# Patient Record
Sex: Male | Born: 1965 | Race: White | Hispanic: No | Marital: Married | Smoking: Never smoker
Health system: Southern US, Community
[De-identification: ages and names within clinical notes are randomized; demographics above are authoritative.]

## PROBLEM LIST (undated history)

## (undated) DIAGNOSIS — D352 Benign neoplasm of pituitary gland: Secondary | ICD-10-CM

## (undated) DIAGNOSIS — G4730 Sleep apnea, unspecified: Secondary | ICD-10-CM

## (undated) DIAGNOSIS — I10 Essential (primary) hypertension: Secondary | ICD-10-CM

## (undated) HISTORY — PX: TONSILLECTOMY: SUR1361

---

## 2016-04-18 DIAGNOSIS — I615 Nontraumatic intracerebral hemorrhage, intraventricular: Secondary | ICD-10-CM

## 2016-04-18 HISTORY — DX: Nontraumatic intracerebral hemorrhage, intraventricular: I61.5

## 2016-06-27 ENCOUNTER — Inpatient Hospital Stay (HOSPITAL_COMMUNITY)
Admission: EM | Admit: 2016-06-27 | Discharge: 2016-07-04 | DRG: 065 | Disposition: A | Discharge status: Home / Self Care | Payer: PRIVATE HEALTH INSURANCE | Attending: Neurology | Admitting: Neurology

## 2016-06-27 ENCOUNTER — Encounter (HOSPITAL_COMMUNITY): Payer: Self-pay | Admitting: Emergency Medicine

## 2016-06-27 DIAGNOSIS — I615 Nontraumatic intracerebral hemorrhage, intraventricular: Secondary | ICD-10-CM | POA: Diagnosis not present

## 2016-06-27 DIAGNOSIS — I161 Hypertensive emergency: Secondary | ICD-10-CM | POA: Diagnosis present

## 2016-06-27 DIAGNOSIS — D72829 Elevated white blood cell count, unspecified: Secondary | ICD-10-CM | POA: Diagnosis present

## 2016-06-27 DIAGNOSIS — Z885 Allergy status to narcotic agent status: Secondary | ICD-10-CM

## 2016-06-27 DIAGNOSIS — Z6841 Body Mass Index (BMI) 40.0 and over, adult: Secondary | ICD-10-CM

## 2016-06-27 DIAGNOSIS — Z91013 Allergy to seafood: Secondary | ICD-10-CM

## 2016-06-27 DIAGNOSIS — R270 Ataxia, unspecified: Secondary | ICD-10-CM | POA: Diagnosis present

## 2016-06-27 DIAGNOSIS — I10 Essential (primary) hypertension: Secondary | ICD-10-CM | POA: Diagnosis present

## 2016-06-27 DIAGNOSIS — E785 Hyperlipidemia, unspecified: Secondary | ICD-10-CM | POA: Diagnosis present

## 2016-06-27 DIAGNOSIS — I619 Nontraumatic intracerebral hemorrhage, unspecified: Secondary | ICD-10-CM

## 2016-06-27 DIAGNOSIS — Z79899 Other long term (current) drug therapy: Secondary | ICD-10-CM

## 2016-06-27 DIAGNOSIS — D497 Neoplasm of unspecified behavior of endocrine glands and other parts of nervous system: Secondary | ICD-10-CM | POA: Diagnosis present

## 2016-06-27 DIAGNOSIS — R51 Headache: Secondary | ICD-10-CM | POA: Diagnosis not present

## 2016-06-27 DIAGNOSIS — E876 Hypokalemia: Secondary | ICD-10-CM | POA: Diagnosis not present

## 2016-06-27 HISTORY — DX: Essential (primary) hypertension: I10

## 2016-06-27 HISTORY — DX: Sleep apnea, unspecified: G47.30

## 2016-06-27 LAB — CBG MONITORING, ED: GLUCOSE-CAPILLARY: 113 mg/dL — AB (ref 65–99)

## 2016-06-27 NOTE — ED Triage Notes (Signed)
Per EMS pt from home pt had dizzy spell that subsided, 9:30 pm N/V, pt did take 25 mg oral phenergan pta, transported on 3/8 and diagnosed with benign brain tumor, pt was instructed to call EMS if pt had dizziness, N or V

## 2016-06-28 ENCOUNTER — Emergency Department (HOSPITAL_COMMUNITY): Payer: PRIVATE HEALTH INSURANCE

## 2016-06-28 ENCOUNTER — Inpatient Hospital Stay (HOSPITAL_COMMUNITY): Payer: PRIVATE HEALTH INSURANCE

## 2016-06-28 ENCOUNTER — Encounter (HOSPITAL_COMMUNITY): Payer: Self-pay | Admitting: Radiology

## 2016-06-28 DIAGNOSIS — I161 Hypertensive emergency: Secondary | ICD-10-CM | POA: Diagnosis not present

## 2016-06-28 DIAGNOSIS — Z885 Allergy status to narcotic agent status: Secondary | ICD-10-CM | POA: Diagnosis not present

## 2016-06-28 DIAGNOSIS — I10 Essential (primary) hypertension: Secondary | ICD-10-CM | POA: Diagnosis present

## 2016-06-28 DIAGNOSIS — I615 Nontraumatic intracerebral hemorrhage, intraventricular: Secondary | ICD-10-CM | POA: Diagnosis not present

## 2016-06-28 DIAGNOSIS — I6789 Other cerebrovascular disease: Secondary | ICD-10-CM

## 2016-06-28 DIAGNOSIS — E785 Hyperlipidemia, unspecified: Secondary | ICD-10-CM | POA: Diagnosis present

## 2016-06-28 DIAGNOSIS — Z91013 Allergy to seafood: Secondary | ICD-10-CM | POA: Diagnosis not present

## 2016-06-28 DIAGNOSIS — R51 Headache: Secondary | ICD-10-CM | POA: Diagnosis present

## 2016-06-28 DIAGNOSIS — D72829 Elevated white blood cell count, unspecified: Secondary | ICD-10-CM | POA: Diagnosis present

## 2016-06-28 DIAGNOSIS — Z79899 Other long term (current) drug therapy: Secondary | ICD-10-CM | POA: Diagnosis not present

## 2016-06-28 DIAGNOSIS — I619 Nontraumatic intracerebral hemorrhage, unspecified: Secondary | ICD-10-CM | POA: Diagnosis not present

## 2016-06-28 DIAGNOSIS — D497 Neoplasm of unspecified behavior of endocrine glands and other parts of nervous system: Secondary | ICD-10-CM | POA: Diagnosis not present

## 2016-06-28 DIAGNOSIS — E876 Hypokalemia: Secondary | ICD-10-CM | POA: Diagnosis not present

## 2016-06-28 DIAGNOSIS — R270 Ataxia, unspecified: Secondary | ICD-10-CM | POA: Diagnosis present

## 2016-06-28 DIAGNOSIS — Z6841 Body Mass Index (BMI) 40.0 and over, adult: Secondary | ICD-10-CM | POA: Diagnosis not present

## 2016-06-28 LAB — CBC
HEMATOCRIT: 39.8 % (ref 39.0–52.0)
Hemoglobin: 13.3 g/dL (ref 13.0–17.0)
MCH: 28 pg (ref 26.0–34.0)
MCHC: 33.4 g/dL (ref 30.0–36.0)
MCV: 83.8 fL (ref 78.0–100.0)
PLATELETS: 269 10*3/uL (ref 150–400)
RBC: 4.75 MIL/uL (ref 4.22–5.81)
RDW: 14.7 % (ref 11.5–15.5)
WBC: 13 10*3/uL — AB (ref 4.0–10.5)

## 2016-06-28 LAB — BASIC METABOLIC PANEL
ANION GAP: 8 (ref 5–15)
BUN: 11 mg/dL (ref 6–20)
CHLORIDE: 102 mmol/L (ref 101–111)
CO2: 29 mmol/L (ref 22–32)
Calcium: 9.4 mg/dL (ref 8.9–10.3)
Creatinine, Ser: 0.78 mg/dL (ref 0.61–1.24)
Glucose, Bld: 122 mg/dL — ABNORMAL HIGH (ref 65–99)
POTASSIUM: 3.6 mmol/L (ref 3.5–5.1)
Sodium: 139 mmol/L (ref 135–145)

## 2016-06-28 LAB — RAPID URINE DRUG SCREEN, HOSP PERFORMED
Amphetamines: NOT DETECTED
BENZODIAZEPINES: NOT DETECTED
Barbiturates: NOT DETECTED
COCAINE: NOT DETECTED
OPIATES: NOT DETECTED
TETRAHYDROCANNABINOL: NOT DETECTED

## 2016-06-28 LAB — URINALYSIS, ROUTINE W REFLEX MICROSCOPIC
BACTERIA UA: NONE SEEN
BILIRUBIN URINE: NEGATIVE
Glucose, UA: NEGATIVE mg/dL
HGB URINE DIPSTICK: NEGATIVE
KETONES UR: NEGATIVE mg/dL
LEUKOCYTES UA: NEGATIVE
NITRITE: NEGATIVE
PH: 7 (ref 5.0–8.0)
Protein, ur: 100 mg/dL — AB
SPECIFIC GRAVITY, URINE: 1.016 (ref 1.005–1.030)
Squamous Epithelial / LPF: NONE SEEN

## 2016-06-28 LAB — ECHOCARDIOGRAM COMPLETE
Height: 71 in
WEIGHTICAEL: 6160 [oz_av]

## 2016-06-28 LAB — MRSA PCR SCREENING: MRSA by PCR: NEGATIVE

## 2016-06-28 MED ORDER — NICARDIPINE HCL IN NACL 20-0.86 MG/200ML-% IV SOLN
3.0000 mg/h | INTRAVENOUS | Status: DC
  Administered 2016-06-28: 5 mg/h via INTRAVENOUS
  Filled 2016-06-28: qty 200

## 2016-06-28 MED ORDER — IOPAMIDOL (ISOVUE-370) INJECTION 76%
50.0000 mL | Freq: Once | INTRAVENOUS | Status: AC | PRN
  Administered 2016-06-28: 50 mL via INTRAVENOUS

## 2016-06-28 MED ORDER — SODIUM CHLORIDE 0.9 % IV SOLN
INTRAVENOUS | Status: DC
  Administered 2016-06-28 – 2016-06-29 (×2): via INTRAVENOUS

## 2016-06-28 MED ORDER — STROKE: EARLY STAGES OF RECOVERY BOOK
Freq: Once | Status: AC
  Administered 2016-06-28: 14:00:00
  Filled 2016-06-28: qty 1

## 2016-06-28 MED ORDER — NICARDIPINE HCL IN NACL 20-0.86 MG/200ML-% IV SOLN
3.0000 mg/h | INTRAVENOUS | Status: DC
Start: 2016-06-28 — End: 2016-06-28
  Administered 2016-06-28 (×2): 10 mg/h via INTRAVENOUS
  Administered 2016-06-28: 15 mg/h via INTRAVENOUS
  Filled 2016-06-28 (×2): qty 200

## 2016-06-28 MED ORDER — GADOBENATE DIMEGLUMINE 529 MG/ML IV SOLN
20.0000 mL | Freq: Once | INTRAVENOUS | Status: AC | PRN
  Administered 2016-06-28: 20 mL via INTRAVENOUS

## 2016-06-28 MED ORDER — ACETAMINOPHEN 325 MG PO TABS: 650.0000 mg | ORAL_TABLET | ORAL | Status: DC | PRN

## 2016-06-28 MED ORDER — LISINOPRIL 20 MG PO TABS
20.0000 mg | ORAL_TABLET | Freq: Two times a day (BID) | ORAL | Status: DC
  Administered 2016-06-29 – 2016-07-04 (×11): 20 mg via ORAL
  Filled 2016-06-28 (×11): qty 1

## 2016-06-28 MED ORDER — CABERGOLINE 0.5 MG PO TABS
0.5000 mg | ORAL_TABLET | ORAL | Status: DC
  Administered 2016-06-29: 0.5 mg via ORAL
  Filled 2016-06-28 (×2): qty 1

## 2016-06-28 MED ORDER — SODIUM CHLORIDE 0.9 % IV BOLUS (SEPSIS)
1000.0000 mL | Freq: Once | INTRAVENOUS | Status: AC
  Administered 2016-06-28: 1000 mL via INTRAVENOUS

## 2016-06-28 MED ORDER — ACETAMINOPHEN 160 MG/5ML PO SOLN: 650.0000 mg | ORAL | Status: DC | PRN

## 2016-06-28 MED ORDER — PROCHLORPERAZINE EDISYLATE 5 MG/ML IJ SOLN
10.0000 mg | Freq: Once | INTRAMUSCULAR | Status: AC
  Administered 2016-06-28: 10 mg via INTRAVENOUS
  Filled 2016-06-28: qty 2

## 2016-06-28 MED ORDER — PANTOPRAZOLE SODIUM 40 MG IV SOLR
40.0000 mg | Freq: Every day | INTRAVENOUS | Status: DC
  Filled 2016-06-28: qty 40

## 2016-06-28 MED ORDER — SENNOSIDES-DOCUSATE SODIUM 8.6-50 MG PO TABS
1.0000 | ORAL_TABLET | Freq: Two times a day (BID) | ORAL | Status: DC
  Administered 2016-06-29 – 2016-07-04 (×11): 1 via ORAL
  Filled 2016-06-28 (×11): qty 1

## 2016-06-28 MED ORDER — LISINOPRIL 10 MG PO TABS: 10.0000 mg | ORAL_TABLET | Freq: Every day | ORAL | Status: DC

## 2016-06-28 MED ORDER — LABETALOL HCL 5 MG/ML IV SOLN
20.0000 mg | Freq: Once | INTRAVENOUS | Status: AC
  Administered 2016-06-28: 20 mg via INTRAVENOUS
  Filled 2016-06-28: qty 4

## 2016-06-28 MED ORDER — AMLODIPINE BESYLATE 10 MG PO TABS
10.0000 mg | ORAL_TABLET | Freq: Every day | ORAL | Status: DC
  Administered 2016-06-28 – 2016-07-04 (×7): 10 mg via ORAL
  Filled 2016-06-28 (×7): qty 1

## 2016-06-28 MED ORDER — DIPHENHYDRAMINE HCL 50 MG/ML IJ SOLN
25.0000 mg | Freq: Once | INTRAMUSCULAR | Status: AC
  Administered 2016-06-28: 25 mg via INTRAVENOUS
  Filled 2016-06-28: qty 1

## 2016-06-28 MED ORDER — ONDANSETRON HCL 4 MG/2ML IJ SOLN
4.0000 mg | Freq: Four times a day (QID) | INTRAMUSCULAR | Status: DC
  Administered 2016-06-28 – 2016-06-29 (×2): 4 mg via INTRAVENOUS
  Filled 2016-06-28 (×3): qty 2

## 2016-06-28 MED ORDER — CABERGOLINE 0.5 MG PO TABS: 0.5000 mg | ORAL_TABLET | ORAL | Status: DC

## 2016-06-28 MED ORDER — CLEVIDIPINE BUTYRATE 0.5 MG/ML IV EMUL
0.0000 mg/h | INTRAVENOUS | Status: DC
  Administered 2016-06-28 (×2): 12 mg/h via INTRAVENOUS
  Administered 2016-06-28: 1 mg/h via INTRAVENOUS
  Administered 2016-06-28: 12 mg/h via INTRAVENOUS
  Administered 2016-06-29: 5 mg/h via INTRAVENOUS
  Administered 2016-06-29: 8 mg/h via INTRAVENOUS
  Administered 2016-06-29: 10 mg/h via INTRAVENOUS
  Administered 2016-06-29: 11 mg/h via INTRAVENOUS
  Administered 2016-06-30: 8 mg/h via INTRAVENOUS
  Administered 2016-06-30: 6 mg/h via INTRAVENOUS
  Administered 2016-06-30: 8 mg/h via INTRAVENOUS
  Administered 2016-06-30: 6 mg/h via INTRAVENOUS
  Filled 2016-06-28 (×13): qty 50

## 2016-06-28 MED ORDER — ACETAMINOPHEN 650 MG RE SUPP
650.0000 mg | RECTAL | Status: DC | PRN
Start: 2016-06-28 — End: 2016-07-01

## 2016-06-28 NOTE — ED Notes (Signed)
Patient c/o of episode of nausea in xray currently no nauseated

## 2016-06-28 NOTE — ED Notes (Signed)
Pt.took a couple of steps in room but very unsteady

## 2016-06-28 NOTE — ED Provider Notes (Signed)
Patient seen/examined in the Emergency Department in conjunction with [facility] Patient reports difficulty walking as well nausea/vomiting.  He was recently diagnosed with "benign brain tumor" (sellar mass) and has been treated with oral medications.   Exam : awake/alert, no arm/leg drift, no facial droop Plan: radiology recommends CT angio.  He will need admission and may need transfer to [facility], MD 06/28/16 [phone]

## 2016-06-28 NOTE — Progress Notes (Signed)
Will notify MD that Cardene gtt at max ordered and SBP 143/74

## 2016-06-28 NOTE — ED Notes (Signed)
Patient in xray introduced myself to wife and son,

## 2016-06-28 NOTE — Progress Notes (Signed)
  Echocardiogram 2D Echocardiogram has been performed.  Andrew Perez 06/28/2016, 5:19 PM

## 2016-06-28 NOTE — ED Provider Notes (Signed)
Hand-off from [place], [place]. Pending CT head angio.  See initial provider's note for full HPI.  Briefly patient is a 51 year old male with history of hypertension who presents the ED with complaints of nausea and vomiting. Patient was seen at [facility] ED last week for the same symptoms and was diagnosed with a pituitary tumor and is being followed by [facility]. Patient has continued to have intermittent vertigo symptoms with nausea. The patient began having intractable vomiting and 11 PM.  Physical Exam  BP (!) 203/92   Pulse (!) 58   Temp 98.7 F (37.1 C) (Oral)   Resp 19   Ht 5\' 11"  (1.803 m)   Wt (!) 174.6 kg   SpO2 97%   BMI 53.70 kg/m   Physical Exam  Constitutional: He is oriented to person, place, and time. He appears well-developed and well-nourished. No distress.  HENT:  Head: Normocephalic and atraumatic.  Mouth/Throat: Oropharynx is clear and moist. No oropharyngeal exudate.  Eyes: Conjunctivae and EOM are normal. Pupils are equal, round, and reactive to light. Right eye exhibits no discharge. Left eye exhibits no discharge. No scleral icterus.  Horizontal nystagmus present bilaterally with rightward and leftward gaze.   Neck: Normal range of motion. Neck supple.  Cardiovascular: Normal rate, regular rhythm, normal heart sounds and intact distal pulses.   Pulmonary/Chest: Effort normal and breath sounds normal. No respiratory distress. He has no wheezes. He has no rales. He exhibits no tenderness.  Abdominal: Soft. There is no tenderness.  Musculoskeletal: Normal range of motion. He exhibits no edema.  Neurological: He is alert and oriented to person, place, and time. He has normal strength. No cranial nerve deficit or sensory deficit. He displays a negative Romberg sign. Coordination normal. GCS eye subscore is 4. GCS verbal subscore is 5. GCS motor subscore is 6.  Skin: Skin is warm and dry. He is not diaphoretic.  Nursing note and vitals  reviewed.   ED Course  .Critical Care Performed by: Nona Dell Authorized by: Nona Dell   Critical care provider statement:    Critical care time (minutes):  40   Critical care was necessary to treat or prevent imminent or life-threatening deterioration of the following conditions: Hypertensive hemorrhage.   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 Pt was then transferred to [facility] where he had an MRI performed on 06/24/15. Endocrine labs performed revealed elevated prolactin at 573. Pt was d/c home with outpatient neurosurgery f/u. Patient with persistent and worsening vertigo symptoms since yesterday. Nausea and vomiting have improved on the ED however patient has continued to have ataxic sxs while in the ED and is unable to walk. CT head showed new enhancing area in the brain, radiologist recommended CT angio head for further evaluation. Dispo pending CT, however plan for admission if CT negative due to ataxia.   CT angio showed Heterogeneous, infiltrative sellar mass as previously described, Hyperdensity in the fourth ventricle suggests a  small amount of acute hemorrhage. On reevaluation, pt resting comfortably in bed and reports improvement of sxs with continued intermittent dizziness ("room spinning"). Discussed results and plan for admission with pt.  Pt's BP has remained in low 582P (systolic), pt given 20mg  Labetalol. Consulted neurosurgery. Dr. Vertell Limber advised that acute hemorrhage is likely due to hypertensive hemorrhage and pituitary tumor appears unchanged. Advised to admit pt to neurology and states he will consult pt during admission. Consulted neurology for admission.  PA- Tamala Julian agrees to admission. Pt started on Cardene drip in the ED.        Chesley Noon [place], PA-C 06/28/16 [place], DO 06/28/16 1127

## 2016-06-28 NOTE — H&P (Signed)
History and physical   Chief Complaint: [place]  History obtained from:  Patient     HPI:                                                                                                                                         Andrew Perez is an 51 y.o. male is certainly seen at [facility] on 06/23/2016 with uncontrolled hypertension nausea vomiting, dizziness, diaphoresis and headache. Patient was found to have an expansile sellar mass for which neurosurgery was consult that at that time while in the ED at [facility] his blood pressures were systolically in the 616W.  Patient was to follow up with neurosurgery as an outpatient. However last night patient awoke with a significant headache. Patient was brought to [place] brain was obtained. This showed a heterogeneous, infiltrative sellar mass as previously described and more fully characterized on outside brain MRI and also now showed a hyperdensity in the fourth ventricle suggesting an of a small amount of acute hemorrhage.  Initially on the emergency department patient's blood pressure was significantly high with systolic blood pressure ranging from 232/105, 212/100, 214/97. Patient was given medications and is currently down to 178/78. Patient states that his headache has improved. Neurology was asked to consult for the bleed and neurosurgery will be also seeing the patient for further evaluation of the pituitary tumor. Patient denies any visual changes, localizing or lateralizing numbness or weakness.   Date last known well: Date: 06/27/2016 Time last known well: Time: 21:00 tPA Given: No: ICH   Past Medical History:  Diagnosis Date  . Hypertension     Past Surgical History:  Procedure Laterality Date  . TONSILLECTOMY      No family history on file. Social History:  reports that he has never smoked. He has never used smokeless tobacco. He reports that he does not drink alcohol or use  drugs.  Allergies:  Allergies  Allergen Reactions  . Codeine Shortness Of Breath  . Shellfish Allergy Shortness Of Breath    Medications:                                                                                                                           Current Facility-Administered Medications  Medication Dose Route Frequency Provider Last Rate Last Dose  . nicardipine (CARDENE)  20mg  in 0.86% saline 213ml IV infusion (0.1 mg/ml)  3-15 mg/hr Intravenous Continuous Nona Dell, PA-C 50 mL/hr at 06/28/16 1145 5 mg/hr at 06/28/16 1145   Current Outpatient Prescriptions  Medication Sig Dispense Refill  . cabergoline (DOSTINEX) 0.5 MG tablet Take 0.5 mg by mouth 2 (two) times a week. Friday and Tuesday  0  . lisinopril (PRINIVIL,ZESTRIL) 10 MG tablet Take 10 mg by mouth daily.  0  . promethazine (PHENERGAN) 25 MG tablet Take 25 mg by mouth every 6 (six) hours as needed for nausea.   0     ROS:                                                                                                                                       History obtained from the patient  General ROS: negative for - chills, fatigue, fever, night sweats, weight gain or weight loss Psychological ROS: negative for - behavioral disorder, hallucinations, memory difficulties, mood swings or suicidal ideation Ophthalmic ROS: negative for - blurry vision, double vision, eye pain or loss of vision ENT ROS: negative for - epistaxis, nasal discharge, oral lesions, sore throat, tinnitus or vertigo Allergy and Immunology ROS: negative for - hives or itchy/watery eyes Hematological and Lymphatic ROS: negative for - bleeding problems, bruising or swollen lymph nodes Endocrine ROS: negative for - galactorrhea, hair pattern changes, polydipsia/polyuria or temperature intolerance Respiratory ROS: negative for - cough, hemoptysis, shortness of breath or wheezing Cardiovascular ROS: negative for - chest pain, dyspnea  on exertion, edema or irregular heartbeat Gastrointestinal ROS: negative for - abdominal pain, diarrhea, hematemesis, nausea/vomiting or stool incontinence Genito-Urinary ROS: negative for - dysuria, hematuria, incontinence or urinary frequency/urgency Musculoskeletal ROS: negative for - joint swelling or muscular weakness Neurological ROS: as noted in HPI Dermatological ROS: negative for rash and skin lesion changes  Neurologic Examination:                                                                                                      Blood pressure 190/83, pulse (!) 55, temperature 98.7 F (37.1 C), temperature source Oral, resp. rate 19, height 5\' 11"  (1.803 m), weight (!) 385 lb (174.6 kg), SpO2 96 %.  HEENT-  Normocephalic, no lesions, without obvious abnormality.  Normal external eye and conjunctiva.  Normal TM's bilaterally.  Normal auditory canals and external ears. Normal external nose, mucus membranes and septum.  Normal pharynx. Cardiovascular- S1, S2 normal, pulses palpable throughout   Lungs- chest clear,  no wheezing, rales, normal symmetric air entry Abdomen- normal findings: bowel sounds normal Extremities- no edema Lymph-no adenopathy palpable Musculoskeletal-no joint tenderness, deformity or swelling Skin-warm and dry, no hyperpigmentation, vitiligo, or suspicious lesions  Neurological Examination Mental Status: Alert, oriented, thought content appropriate.  Speech fluent without evidence of aphasia.  Able to follow 3 step commands without difficulty. Cranial Nerves: II: Visual fields grossly normal,  III,IV, VI: ptosis not present, extra-ocular motions intact bilaterally, pupils equal, round, reactive to light and accommodation V,VII: smile symmetric, facial light touch sensation normal bilaterally VIII: hearing normal bilaterally IX,X: uvula rises symmetrically XI: bilateral shoulder shrug XII: midline tongue extension Motor: Right : Upper extremity    5/5    Left:     Upper extremity   5/5  Lower extremity   5/5     Lower extremity   5/5 Tone and bulk:normal tone throughout; no atrophy noted Sensory: Pinprick and light touch intact throughout, bilaterally Deep Tendon Reflexes: 1+ and symmetric throughout Plantars: Right: downgoing   Left: downgoing Cerebellar: normal finger-to-nose and normal heel-to-shin test Gait: not tested       Lab Results: Basic Metabolic Panel:  Recent Labs Lab 06/28/16 0105  NA 139  K 3.6  CL 102  CO2 29  GLUCOSE 122*  BUN 11  CREATININE 0.78  CALCIUM 9.4    Liver Function Tests: No results for input(s): AST, ALT, ALKPHOS, BILITOT, PROT, ALBUMIN in the last 168 hours. No results for input(s): LIPASE, AMYLASE in the last 168 hours. No results for input(s): AMMONIA in the last 168 hours.  CBC:  Recent Labs Lab 06/28/16 0105  WBC 13.0*  HGB 13.3  HCT 39.8  MCV 83.8  PLT 269    Cardiac Enzymes: No results for input(s): CKTOTAL, CKMB, CKMBINDEX, TROPONINI in the last 168 hours.  Lipid Panel: No results for input(s): CHOL, TRIG, HDL, CHOLHDL, VLDL, LDLCALC in the last 168 hours.  CBG:  Recent Labs Lab 06/27/16 2355  GLUCAP 113*    Microbiology: No results found for this or any previous visit.  Coagulation Studies: No results for input(s): LABPROT, INR in the last 72 hours.  Imaging: Ct Angio Head W Or Wo Contrast  Result Date: 06/28/2016 CLINICAL DATA:  Nausea, vomiting, and unsteady gait. 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 Possible hemorrhage in the fourth ventricle on recent CT. EXAM: CT ANGIOGRAPHY HEAD TECHNIQUE: Multidetector CT imaging of the head was performed using the standard protocol during bolus administration of intravenous contrast. Multiplanar CT image reconstructions and MIPs were obtained to evaluate the vascular anatomy. CONTRAST:  50 mL Isovue 370 COMPARISON:  Head CT 06/28/2016 and 06/23/2016.  Outside brain [facility] report [facility] 06/23/2016. FINDINGS: Anterior circulation: The internal carotid arteries are patent from skullbase to carotid termini. There is mild carotid siphon atherosclerosis bilaterally without significant stenosis. The previously described sellar tumor partially encases the right cavernous ICA without significant narrowing. No aneurysm is identified in this region. ACAs and MCAs are patent with branch vessel irregularity but no evidence of major branch occlusion or flow limiting proximal stenosis within limitations of artifact from quantum mottle. Posterior circulation: The visualized distal vertebral arteries are widely patent and codominant. Right PICA is patent. Left PICA, AICAs, and SCAs are not well seen. Basilar artery is widely patent. Posterior communicating arteries are not identified. PCAs are patent with branch vessel irregularity but no evidence of flow limiting proximal proximal stenosis. No aneurysm  is identified. Venous sinuses: Patent. Anatomic variants: None. Delayed phase: Heterogeneous mass expanding the sella as previously described with extension into the right cavernous sinus, sphenoid sinus, and clivus. 10 mm hyperdense focus in the fourth ventricle suspicious for acute hemorrhage as described on CT. IMPRESSION: 1. No intracranial aneurysm identified.  No large vessel occlusion. 2. Heterogeneous, infiltrative sellar mass as previously described and more fully characterized on outside brain MRI. 3. Hyperdensity in the fourth ventricle suggests a small amount of acute hemorrhage. Correlate with outside brain MRI to assess for an underlying lesion in this location. Electronically Signed   By: Logan Bores M.D.   On: 06/28/2016 09:06   Ct Head Wo Contrast  Result Date: 06/28/2016 CLINICAL DATA:  Vertigo.  Recent diagnosis of brain tumor. EXAM: CT HEAD WITHOUT CONTRAST TECHNIQUE: Contiguous axial images were obtained from the base  of the skull through the vertex without intravenous contrast. COMPARISON:  06/23/2016 FINDINGS: Brain: Mass lesion again demonstrated sella with extension into the sphenoid sinuses. Lesion measures about 2.4 x 2.5 cm in diameter and contains calcification. Associated expansile bone changes and bone remodeling. Sulci are somewhat effaced diffusely but basal cisterns remain patent and ventricles are not dilated. Gray-white matter junctions are distinct. No abnormal extra-axial fluid collections. Circumscribed area of hyperdensity in the region of the fourth ventricle is more prominent than on the previous study and may represent focal calcification such as with meningioma, aneurysm, a metastatic lesion, or focal area of hemorrhage. This area measures about 10 x 11 by 11 mm. Vascular: Vascular calcifications are present with extension of the right cavernous carotid canal. Skull: Calvarium appears intact. Sinuses/Orbits: Paranasal sinuses are clear except for retention cyst in the left maxillary antrum. Right mastoid effusions. Other: Similar appearance to previous study. IMPRESSION: Mass again demonstrated in the sella and sphenoid sinuses that could represent a sellar tumor or possibly a large aneurysm arising from the cavernous carotid artery. Focal hyperintensity in the region of the fourth ventricle is more prominent than on the previous study and may represent metastatic lesion, focal hemorrhage, or meningioma. Progression since previous study is not excluded. Suggest CT angiography for additional evaluation. These results were called by telephone at the time of interpretation on 06/28/2016 at 5:46 am to Dr. Margarita Mail , who verbally acknowledged these results. Electronically Signed   By: Lucienne Capers M.D.   On: 06/28/2016 06:00       Assessment and plan discussed with with attending physician and they are in agreement.    Etta Quill PA-C [facility] [phone]  06/28/2016, 11:26  AM   Assessment: 51 y.o. male presenting with main complaint of throbbing headache. No other associated symptoms. Patient CT scan showed intracranial hemorrhage. At this point blood pressure is currently being controlled in the emergency department. Patient has a known pituitary tumor to which neurosurgery will be consulting on. Patient is not on any antiplatelets and will remain off antiplatelets at this time. Patient will be admitted to the ICU for blood pressure control and likely need Cardene to control his blood pressure.   Stroke Risk Factors - hypertension  Plan: 1-admit to ICU 2-low pressure control with goal blood pressure systolically 032 diastolically 90 3-MRI brain  Addendum: I have seen and examined pateint and agree with above note.  Neurosurgery has been consulted, per ED.  PT will be admitted to the neuro ICU and his blood pressure will be controlled to minimize bleed.  Will require frequent neuro checks.  PT will be  followed by stroke team after tomorrow am.

## 2016-06-28 NOTE — ED Provider Notes (Signed)
[facility] DEPT Provider Note   CSN: [id number] Arrival date & time: 06/27/16  2334     History   Chief Complaint Chief Complaint  Patient presents with  . Near Syncope    HPI Andrew Perez is a 51 y.o. male who presents emergency Department with chief complaint of intractable nausea and vomiting. Patient states that he presented to [facility] emergency Department Thursday, March 8 for the same symptoms and was diagnosed with a pituitary tumor. He is currently being evaluated at [facility] for this. Patient states he just started on some new medications for his symptoms. He has had intermittent bouts of vertiginous symptoms with nausea and states that he normally gets around 10 AM and in the morning, lasting for just a short period of time. He is onset of these symptoms this evening and has had intractable vomiting since onset about 11 PM. The patient states that he took a Phenergan tablet at home but feels he likely vomited this up. He complains of severe nausea. Eyes hematemesis. He feels that the room is spinning currently. Patient denies any other neurologic symptoms  HPI  Past Medical History:  Diagnosis Date  . Hypertension     There are no active problems to display for this patient.   Past Surgical History:  Procedure Laterality Date  . TONSILLECTOMY         Home Medications    Prior to Admission medications   Not on File    Family History No family history on file.  Social History Social History  Substance Use Topics  . Smoking status: Never Smoker  . Smokeless tobacco: Never Used  . Alcohol use No     Allergies   Codeine and Shellfish allergy   Review of Systems Review of Systems  Ten systems reviewed and are negative for acute change, except as noted in the HPI.   Physical Exam Updated Vital Signs BP (!) 214/97 (BP Location: Right Arm)   Pulse (!) 53   Temp 98 F (36.7 C) (Oral)   Resp 21   Ht 5\' 11"  (1.803 m)   Wt  (!) 174.6 kg   SpO2 98%   BMI 53.70 kg/m   Physical Exam  Constitutional: He is oriented to person, place, and time. He appears well-developed and well-nourished. No distress.  HENT:  Head: Normocephalic and atraumatic.  Eyes: Conjunctivae are normal. No scleral icterus.  Neck: Normal range of motion. Neck supple.  Cardiovascular: Normal rate, regular rhythm and normal heart sounds.   Pulmonary/Chest: Effort normal and breath sounds normal. No respiratory distress.  Abdominal: Soft. There is no tenderness.  Musculoskeletal: He exhibits no edema.  Neurological: He is alert and oriented to person, place, and time.  Speech is clear and goal oriented, follows commands Major Cranial nerves without deficit, no facial droop Normal strength in upper and lower extremities bilaterally including dorsiflexion and plantar flexion, strong and equal grip strength Sensation normal to light and sharp touch Patient is too ataxic to walk  Skin: Skin is warm and dry. He is not diaphoretic.  Psychiatric: His behavior is normal.  Nursing note and vitals reviewed.    ED Treatments / Results  Labs (all labs ordered are listed, but only abnormal results are displayed) Labs Reviewed  CBG MONITORING, ED - Abnormal; Notable for the following:       Result Value   Glucose-Capillary 113 (*)    All other components within normal limits  BASIC METABOLIC PANEL  CBC  URINALYSIS, ROUTINE W REFLEX MICROSCOPIC    EKG  EKG Interpretation  Date/Time:  Monday June 27 2016 23:48:46 EDT Ventricular Rate:  55 PR Interval:    QRS Duration: 107 QT Interval:  466 QTC Calculation: 446 R Axis:   -22 Text Interpretation:  Sinus rhythm Interpretation limited secondary to artifact Left ventricular hypertrophy Anterior Q waves, possibly due to LVH Abnormal ekg No previous ECGs available Confirmed by Christy Gentles  MD, DONALD ([id number]) on 06/27/2016 11:57:04 PM       Radiology No results found.  Procedures Procedures  (including critical care time)  Medications Ordered in ED Medications - No data to display   Initial Impression / Assessment and Plan / ED Course  I have reviewed the triage vital signs and the nursing notes.  Pertinent labs & imaging results that were available during my care of the patient were reviewed by me and considered in my medical decision making (see chart for details).  Clinical Course as of Jun 29 702  Tue Jun 28, 2016  0502 Patient continues to be ataxic. Will obtain head CT.  [AH]    Clinical Course User Index [AH] Margarita Mail, PA-C    Patient with a known pituitary sellar mass with pressure on the left optic nerve. He has persistent vertigo. Nausea is improved. However, patient is ataxic. CT shows some new enhancing area in the brain and the radiologist recommended a CT angiogram head. This currently pending. Given sign out to PA [place]. If CT is negative. Patient will need admission for ataxia. If there is some abnormality, he will likely need a consult with [facility] neurosurgery and potential transfer.  Final Clinical Impressions(s) / ED Diagnoses   Final diagnoses:  None    New Prescriptions New Prescriptions   No medications on file     Margarita Mail, PA-C 06/28/16 8309    Ripley Fraise, MD 06/28/16 [phone]

## 2016-06-28 NOTE — ED Notes (Signed)
Patient transported to CT 

## 2016-06-28 NOTE — ED Notes (Signed)
Stood pt up to use urinal pt ,unsteady wolbing back and forth.assit pt.back in to the afterusing the urineal.

## 2016-06-29 ENCOUNTER — Encounter (HOSPITAL_COMMUNITY): Payer: Self-pay | Admitting: *Deleted

## 2016-06-29 ENCOUNTER — Inpatient Hospital Stay (HOSPITAL_COMMUNITY): Payer: PRIVATE HEALTH INSURANCE

## 2016-06-29 DIAGNOSIS — I161 Hypertensive emergency: Secondary | ICD-10-CM | POA: Diagnosis present

## 2016-06-29 DIAGNOSIS — D497 Neoplasm of unspecified behavior of endocrine glands and other parts of nervous system: Secondary | ICD-10-CM | POA: Diagnosis present

## 2016-06-29 DIAGNOSIS — I615 Nontraumatic intracerebral hemorrhage, intraventricular: Secondary | ICD-10-CM

## 2016-06-29 LAB — BASIC METABOLIC PANEL
Anion gap: 5 (ref 5–15)
BUN: 9 mg/dL (ref 6–20)
CALCIUM: 9 mg/dL (ref 8.9–10.3)
CO2: 28 mmol/L (ref 22–32)
CREATININE: 0.76 mg/dL (ref 0.61–1.24)
Chloride: 107 mmol/L (ref 101–111)
GFR calc Af Amer: >60 mL/min (ref >60)
GFR calc non Af Amer: >60 mL/min (ref >60)
GLUCOSE: 102 mg/dL — AB (ref 65–99)
Potassium: 3.2 mmol/L — ABNORMAL LOW (ref 3.5–5.1)
Sodium: 140 mmol/L (ref 135–145)

## 2016-06-29 LAB — LIPID PANEL
CHOL/HDL RATIO: 4.1 ratio
Cholesterol: 148 mg/dL (ref 0–200)
HDL: 36 mg/dL — AB (ref >40)
LDL CALC: 97 mg/dL (ref 0–99)
TRIGLYCERIDES: 73 mg/dL (ref <150)
VLDL: 15 mg/dL (ref 0–40)

## 2016-06-29 LAB — CBC
HEMATOCRIT: 36.8 % — AB (ref 39.0–52.0)
Hemoglobin: 12 g/dL — ABNORMAL LOW (ref 13.0–17.0)
MCH: 27.6 pg (ref 26.0–34.0)
MCHC: 32.6 g/dL (ref 30.0–36.0)
MCV: 84.6 fL (ref 78.0–100.0)
Platelets: 287 10*3/uL (ref 150–400)
RBC: 4.35 MIL/uL (ref 4.22–5.81)
RDW: 14.9 % (ref 11.5–15.5)
WBC: 13.8 10*3/uL — ABNORMAL HIGH (ref 4.0–10.5)

## 2016-06-29 LAB — HIV ANTIBODY (ROUTINE TESTING W REFLEX): HIV SCREEN 4TH GENERATION: NONREACTIVE

## 2016-06-29 LAB — TSH: TSH: 0.45 u[IU]/mL (ref 0.350–4.500)

## 2016-06-29 LAB — VITAMIN B12: Vitamin B-12: 383 pg/mL (ref 180–914)

## 2016-06-29 MED ORDER — ONDANSETRON HCL 4 MG/2ML IJ SOLN: 4.0000 mg | Freq: Four times a day (QID) | INTRAMUSCULAR | Status: DC | PRN

## 2016-06-29 MED ORDER — POTASSIUM CHLORIDE CRYS ER 20 MEQ PO TBCR
40.0000 meq | EXTENDED_RELEASE_TABLET | ORAL | Status: DC
  Filled 2016-06-29: qty 2

## 2016-06-29 MED ORDER — HYDRALAZINE HCL 50 MG PO TABS
50.0000 mg | ORAL_TABLET | Freq: Three times a day (TID) | ORAL | Status: DC
  Administered 2016-06-29 – 2016-06-30 (×2): 50 mg via ORAL
  Filled 2016-06-29 (×2): qty 1

## 2016-06-29 MED ORDER — VITAMIN B-12 1000 MCG PO TABS
1000.0000 ug | ORAL_TABLET | Freq: Every day | ORAL | Status: DC
  Administered 2016-06-29 – 2016-07-04 (×6): 1000 ug via ORAL
  Filled 2016-06-29 (×6): qty 1

## 2016-06-29 MED ORDER — HYDRALAZINE HCL 25 MG PO TABS
25.0000 mg | ORAL_TABLET | Freq: Three times a day (TID) | ORAL | Status: DC
  Administered 2016-06-29 (×2): 25 mg via ORAL
  Filled 2016-06-29 (×2): qty 1

## 2016-06-29 MED ORDER — POTASSIUM CHLORIDE CRYS ER 20 MEQ PO TBCR
40.0000 meq | EXTENDED_RELEASE_TABLET | ORAL | Status: AC
  Administered 2016-06-29 (×2): 40 meq via ORAL
  Filled 2016-06-29: qty 2

## 2016-06-29 NOTE — Evaluation (Signed)
Speech Language Pathology Evaluation Patient Details Name: Andrew Perez MRN: [id number] DOB: 01-Jan-1966 Today's Date: 06/29/2016 Time: 9381-8299 SLP Time Calculation (min) (ACUTE ONLY): 25 min  Problem List:  Patient Active Problem List   Diagnosis Date Noted  . ICH (intracerebral hemorrhage) ([place]) 06/28/2016   Past Medical History:  Past Medical History:  Diagnosis Date  . Hypertension   . Sleep apnea    Past Surgical History:  Past Surgical History:  Procedure Laterality Date  . TONSILLECTOMY     HPI:  51 year old male admitted 06/27/16 due to increase in symptoms evaluated at [facility] - uncontrolled HTN, N/V, dizziness, diaphoresis, headache. PMH significant for HTN. 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   Assessment / Plan / Recommendation Clinical Impression  The Montreal Cognitive Assessment (MoCA) was administered. Pt scored 27/30 (n=26+/30), indicating performance within normal limits for this evaluation and pt level of education (master's degree). Points were lost on delayed recall subtest, recalling 2/5 words. Given level of independence prior to admit, pt was encouraged to notify MD if changes in cognitive linguistic function are noted once he returns to his normal routine, as outpatient therapy may be beneficial. No further ST intervention is recommended at this time.     SLP Assessment  SLP Recommendation/Assessment: Patient does not need any further Speech Lanaguage Pathology Services SLP Visit Diagnosis: Cognitive communication deficit (R41.841)    Follow Up Recommendations  Outpatient SLP (if difficulty arises with return to daily activities, work schedule)          SLP Evaluation Cognition  Overall Cognitive Status: Within Functional Limits for tasks assessed Arousal/Alertness: Awake/alert Orientation Level: Oriented X4 Attention: Focused;Sustained;Selective Focused Attention: Appears  intact Sustained Attention: Appears intact Selective Attention: Appears intact Memory: Appears intact Awareness: Appears intact Problem Solving: Appears intact Safety/Judgment: Appears intact       Comprehension  Auditory Comprehension Overall Auditory Comprehension: Appears within functional limits for tasks assessed    Expression Expression Primary Mode of Expression: Verbal Verbal Expression Overall Verbal Expression: Appears within functional limits for tasks assessed Written Expression Dominant Hand: Right   Oral / Motor  Oral Motor/Sensory Function Overall Oral Motor/Sensory Function: Within functional limits Motor Speech Overall Motor Speech: Appears within functional limits for tasks assessed Intelligibility: Intelligible Motor Speech Errors: Not applicable   Hodges Treiber B. Quentin O[facility], CCC-SLP [phone]         Shonna Chock 06/29/2016, 10:02 AM

## 2016-06-29 NOTE — Progress Notes (Signed)
**  Preliminary report by tech**   Carotid artery duplex complete. Findings are consistent with a 1-39 percent stenosis involving the right internal carotid artery and the left internal carotid artery. The vertebral arteries demonstrate antegrade flow.  06/29/16 11:40 AM Andrew Perez RVT

## 2016-06-29 NOTE — Progress Notes (Signed)
STROKE TEAM PROGRESS NOTE   HISTORY OF PRESENT ILLNESS (per record) Andrew Perez is an 51 y.o. male seen at  [facility] on 06/23/2016 with uncontrolled hypertension, nausea, vomiting, dizziness, diaphoresis and headache. 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 While in the ED at [facility] his blood pressures were systolically in the 272Z.  Patient was to follow up with neurosurgery as an outpatient. However last night patient awoke with a significant headache. Patient was brought to [place] brain was obtained. This showed a heterogeneous, infiltrative sellar mass as previously described and more fully characterized on outside brain MRI and also now showed a hyperdensity in the fourth ventricle suggesting a small amount of acute hemorrhage.  Initially on the emergency department patient's blood pressure was significantly high with systolic blood pressure ranging from 232/105, 212/100, 214/97. Patient was given medications and is currently down to 178/78. Patient states that his headache has improved. Neurology was asked to consult for the bleed and neurosurgery will be also seeing the patient for further evaluation of the pituitary tumor. Patient denies any visual changes, localizing or lateralizing numbness or weakness. Patient was last known well 06/27/2016 at 21:00. Patient was not administered IV t-PA secondary to [place]. He was admitted to the neuro ICU for further evaluation and treatment.   SUBJECTIVE (INTERVAL HISTORY) Pt RN at bedside. Pt still on cleviprex and BP stable. Will increase po Meds for BP control. Pt denies HA or double vision, blurry vision, N/V. CTA negative for aneurysm. MRI confirmed IV ventricle small bleeding and no hydrocephalus.    OBJECTIVE Temp:  [97.7 F (36.5 C)-98.9 F (37.2 C)] 98.8 F (37.1 C) (03/14 0800) Pulse Rate:  [55-80] 71 (03/14 0900) Resp:  [0-28] 22  (03/14 0900) BP: (124-204)/(51-103) 147/82 (03/14 0900) SpO2:  [86 %-98 %] 91 % (03/14 0900)  CBC:  Recent Labs Lab 06/28/16 0105 06/29/16 0242  WBC 13.0* 13.8*  HGB 13.3 12.0*  HCT 39.8 36.8*  MCV 83.8 84.6  PLT 269 366    Basic Metabolic Panel:  Recent Labs Lab 06/28/16 0105 06/29/16 0242  NA 139 140  K 3.6 3.2*  CL 102 107  CO2 29 28  GLUCOSE 122* 102*  BUN 11 9  CREATININE 0.78 0.76  CALCIUM 9.4 9.0    Lipid Panel:    Component Value Date/Time   CHOL 148 06/29/2016 0242   TRIG 73 06/29/2016 0242   HDL 36 (L) 06/29/2016 0242   CHOLHDL 4.1 06/29/2016 0242   VLDL 15 06/29/2016 0242   LDLCALC 97 06/29/2016 0242   HgbA1c: No results found for: HGBA1C Urine Drug Screen:    Component Value Date/Time   LABOPIA NONE DETECTED 06/28/2016 1744   COCAINSCRNUR NONE DETECTED 06/28/2016 1744   LABBENZ NONE DETECTED 06/28/2016 1744   AMPHETMU NONE DETECTED 06/28/2016 1744   THCU NONE DETECTED 06/28/2016 1744   LABBARB NONE DETECTED 06/28/2016 1744      IMAGING I have personally reviewed the radiological images below and agree with the radiology interpretations.  Ct Head Wo Contrast 06/28/2016 Mass again demonstrated in the sella and sphenoid sinuses that could represent a sellar tumor or possibly a large aneurysm arising from the cavernous carotid artery. Focal hyperintensity in the region of the fourth ventricle is more prominent than on the previous study and may represent metastatic lesion, focal hemorrhage, or meningioma. Progression since previous study is not excluded. Suggest  CT angiography for additional evaluation.   Ct Angio Head W Or Wo Contrast 06/28/2016 1. No intracranial aneurysm identified.  No large vessel occlusion. 2. Heterogeneous, infiltrative sellar mass as previously described and more fully characterized on outside brain MRI. 3. Hyperdensity in the fourth ventricle suggests a small amount of acute hemorrhage.   Mr Andrew Perez Wo  Contrast 06/28/2016 Primary central skullbase mass, with dedicated imaging done at outside institution. 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 Small amount of intraventricular hemorrhage, no ventricular mass. This could reflect isolated IVH due to hypertension, and is unlikely related to sellar lesion. Mild chronic small vessel ischemic disease. Old RIGHT basal ganglia lacunar infarct.   2-D echocardiogram - Left ventricle: The cavity size was mildly dilated. Wall thickness was increased in a pattern of moderate LVH. Systolic function was normal. The estimated ejection fraction was in the range of 55% to 60%. Wall motion was normal; there were no regional wall motion abnormalities. Features are consistent with a pseudonormal left ventricular filling pattern, with concomitant abnormal relaxation and increased filling pressure (grade 2 diastolic dysfunction). Doppler parameters are consistent with high ventricular filling pressure. - Aortic root: The aortic root was mildly dilated. - Left atrium: The atrium was moderately dilated. Impressions:   Normal LV systolic function; moderate LVH; grade 2 diastolic dysfunction with elevated LV filling pressure; mildly dilated aortic root; moderate LAE.  CUS pending   PHYSICAL EXAM  Temp:  [97.7 F (36.5 C)-98.9 F (37.2 C)] 98.8 F (37.1 C) (03/14 0800) Pulse Rate:  [55-80] 57 (03/14 1100) Resp:  [0-28] 19 (03/14 1100) BP: (124-176)/(51-95) 150/83 (03/14 1100) SpO2:  [86 %-98 %] 97 % (03/14 1100)  General - morbid obesity, well developed, in no apparent distress.  Ophthalmologic - Fundi not visualized due to eye movement.  Cardiovascular - Regular rate and rhythm.  Mental Status -  Level of arousal and orientation to time, place, and person were intact. Language including expression, naming, repetition, comprehension was assessed and found intact. Attention span and concentration  were normal. Fund of Knowledge was assessed and was intact.  Cranial Nerves II - XII - II - Visual field intact OU. III, IV, VI - Extraocular movements intact. V - Facial sensation intact bilaterally. VII - Facial movement intact bilaterally. VIII - Hearing & vestibular intact bilaterally. X - Palate elevates symmetrically. XI - Chin turning & shoulder shrug intact bilaterally. XII - Tongue protrusion intact.  Motor Strength - The patient's strength was normal in all extremities and pronator drift was absent.  Bulk was normal and fasciculations were absent.   Motor Tone - Muscle tone was assessed at the neck and appendages and was normal.  Reflexes - The patient's reflexes were 1+ in all extremities and he had no pathological reflexes.  Sensory - Light touch, temperature/pinprick were assessed and were symmetrical.    Coordination - The patient had normal movements in the hands with no ataxia or dysmetria.  Tremor was absent.  Gait and Station - deferred   ASSESSMENT/PLAN [name] is a 51 y.o. male with history of HTN and known pituitary tumor presenting with severe HA. CT showed IV ventricle small IVH which is confirmed by MRI.   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 Focal hyperintensity fourth ventricle, more prominent.  CTA head no aneurysm, no LVO. Infiltrative sellar mass. Hyperdensity fourth ventricle suggests hemorrhage   MRI  primary central skull  base mass. Small IVH, no ventricular mass, unlikely to be related to sellar lesion. Old R BG infarct  Carotid Doppler - pending  2D Echo  EF 55-60%. Moderate LVH. No source of embolus.  LDL 97  HgbA1c pending  SCDs for VTE prophylaxis  Diet Heart Room service appropriate? 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  On cabergolin - continued on this admission  Hypertensive Emergency  BP on presentation 232/105, 212/100, 214/97 in setting of neurologic symptoms  initially treated with Cardene, changed to Cleviprex given inability to reach BP goal  SBP goal < 140  On lisinopril, amlodipine and hydralazine  Wean off cleviprex as able  Hyperlipidemia  Home meds:  No statin  LDL 97  No need to start statin in setting of hemorrhage  Morbid obesity  BMI 53.7  recommend weight loss, diet and exercise as appropriate   Other Stroke Risk Factors  UDS negative   Other Active Problems  Hypokalemia - supplement  leukocystosis - 13.8  HIV negative  TSH normal  Hospital day # 1  This patient is critically ill due to IVH and suprasellar mass and at significant risk of neurological worsening, death form hydrocephalus, increased IVH, pituitary apoplexy. This patient's care requires constant monitoring of vital signs, hemodynamics, respiratory and cardiac monitoring, review of multiple databases, neurological assessment, discussion with family, other specialists and medical decision making of high complexity. I spent 40 minutes of neurocritical care time in the care of this patient.  Rosalin Hawking, MD PhD Stroke Neurology 06/29/2016 11:25 AM   To contact Stroke Continuity provider, please refer to [email or web address] After hours, contact General Neurology

## 2016-06-29 NOTE — Evaluation (Signed)
Physical Therapy Evaluation Patient Details Name: Andrew Perez MRN: [id number] DOB: 10-14-1965 Today's Date: 06/29/2016   History of Present Illness  51 year old male admitted 06/27/16 due to increase in symptoms evaluated at [facility] - uncontrolled HTN, N/V, dizziness, diaphoresis, headache. Blood pressure was significantly high with systolic blood pressure ranging from 232/105, 212/100, 214/97.  PMH significant for HTN.  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   Clinical Impression  Patient presents with decreased independence with mobility due to decreased balance and some generalized weakness.  He will benefit from skilled PT in the acute setting to allow return home with family support.  Likely not to need follow up PT at d/c, but will assess need for any DME.    Follow Up Recommendations No PT follow up    Equipment Recommendations  Other (comment) (TBA)    Recommendations for Other Services       Precautions / Restrictions Precautions Precautions: Fall Precaution Comments: watch BP Restrictions Weight Bearing Restrictions: No      Mobility  Bed Mobility Overal bed mobility: Modified Independent                Transfers Overall transfer level: Needs assistance   Transfers: Sit to/from Stand Sit to Stand: Supervision         General transfer comment: assist for lines and safety, increased time to rise  Ambulation/Gait Ambulation/Gait assistance: Min assist Ambulation Distance (Feet): 150 Feet Assistive device: None;1 person hand held assist Gait Pattern/deviations: Step-through pattern;Trunk flexed;Shuffle;Decreased stride length     General Gait Details: shuffling steps with forward flexed posture, reports gait is slower than normal and little imbalance, so gave hand hold assist partway through ambualtion   Stairs            Wheelchair Mobility    Modified Rankin (Stroke Patients  Only) Modified Rankin (Stroke Patients Only) Pre-Morbid Rankin Score: No significant disability Modified Rankin: Moderately severe disability     Balance Overall balance assessment: Needs assistance   Sitting balance-Leahy Scale: Good       Standing balance-Leahy Scale: Good                               Pertinent Vitals/Pain Pain Assessment: No/denies pain    Home Living Family/patient expects to be discharged to:: Private residence Living Arrangements: Spouse/significant other Available Help at Discharge: Family Type of Home: House Home Access: Stairs to enter Entrance Stairs-Rails: Right Entrance Stairs-Number of Steps: 6 Home Layout: One level Home Equipment: Shower seat;Grab bars - tub/shower;Grab bars - toilet      Prior Function Level of Independence: Independent               Hand Dominance   Dominant Hand: Right    Extremity/Trunk Assessment   Upper Extremity Assessment Upper Extremity Assessment: Overall WFL for tasks assessed    Lower Extremity Assessment Lower Extremity Assessment: Overall WFL for tasks assessed       Communication   Communication: No difficulties  Cognition Arousal/Alertness: Awake/alert Behavior During Therapy: WFL for tasks assessed/performed Overall Cognitive Status: Within Functional Limits for tasks assessed                      General Comments      Exercises     Assessment/Plan    PT Assessment Patient needs continued PT services  PT Problem List  Decreased mobility;Decreased balance;Decreased knowledge of use of DME;Decreased activity tolerance;Decreased safety awareness;Decreased strength       PT Treatment Interventions DME instruction;Therapeutic activities;Gait training;Stair training;Balance training;Functional mobility training;Therapeutic exercise;Patient/family education    PT Goals (Current goals can be found in the Care Plan section)  Acute Rehab PT Goals Patient Stated  Goal: To return home PT Goal Formulation: With patient Time For Goal Achievement: 07/06/16 Potential to Achieve Goals: Good    Frequency Min 4X/week   Barriers to discharge        Co-evaluation               End of Session   Activity Tolerance: Patient tolerated treatment well Patient left: with call bell/phone within reach;in chair;with chair alarm set   PT Visit Diagnosis: Unsteadiness on feet (R26.81);Other abnormalities of gait and mobility (R26.89)         Time: 8757-9728 PT Time Calculation (min) (ACUTE ONLY): 23 min   Charges:   PT Evaluation $PT Eval Moderate Complexity: 1 Procedure PT Treatments $Gait Training: 8-22 mins   PT G Codes:         Reginia Naas July 26, 2016, 10:37 AM  Magda Kiel, [place] Jul 26, 2016

## 2016-06-29 NOTE — Progress Notes (Signed)
**  Preliminary report by tech**  Carotid artery duplex complete. Findings are consistent with a 1-39 percent stenosis involving the right internal carotid artery and the left internal carotid artery. The vertebral arteries demonstrate antegrade flow.  06/29/16 11:41 AM Andrew Perez RVT

## 2016-06-29 NOTE — Care Management Note (Signed)
Case Management Note  Patient Details  Name: NUMAN ZYLSTRA MRN: [id number] Date of Birth: 24-Dec-1965  Subjective/Objective: PT admitted on 06/27/16 with  uncontrolled HTN, N/V, dizziness, diaphoresis, headache.   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  PTA, pt independent, lives with spouse.             Action/Plan: PT recommending no OP follow up.  Will follow for additional home needs.    Expected Discharge Date:                  Expected Discharge Plan:  Home/Self Care  In-House Referral:     Discharge planning Services  CM Consult  Post Acute Care Choice:    Choice offered to:     DME Arranged:    DME Agency:     HH Arranged:    HH Agency:     Status of Service:  In process, will continue to follow  If discussed at Long Length of Stay Meetings, dates discussed:    Additional Comments:  Reinaldo Raddle, RN, BSN  Trauma/Neuro ICU Case Manager [phone]

## 2016-06-29 NOTE — Progress Notes (Signed)
OT Cancellation Note  Patient Details Name: Andrew Perez MRN: [id number] DOB: 10-06-1965   Cancelled Treatment:    Reason Eval/Treat Not Completed: Other (comment). Pt on bedrest. Please update activity orders when appropriate for therapy.   [place], OT/L  [phone] 06/29/2016 06/29/2016, 8:16 AM

## 2016-06-30 LAB — BASIC METABOLIC PANEL
ANION GAP: 9 (ref 5–15)
BUN: 14 mg/dL (ref 6–20)
CALCIUM: 9.1 mg/dL (ref 8.9–10.3)
CO2: 26 mmol/L (ref 22–32)
Chloride: 104 mmol/L (ref 101–111)
Creatinine, Ser: 0.79 mg/dL (ref 0.61–1.24)
GFR calc Af Amer: >60 mL/min (ref >60)
GFR calc non Af Amer: >60 mL/min (ref >60)
GLUCOSE: 98 mg/dL (ref 65–99)
Potassium: 3.7 mmol/L (ref 3.5–5.1)
Sodium: 139 mmol/L (ref 135–145)

## 2016-06-30 LAB — VAS US CAROTID
LCCADDIAS: 10 cm/s
LCCADSYS: 91 cm/s
LCCAPDIAS: 15 cm/s
LEFT ECA DIAS: 0 cm/s
LEFT VERTEBRAL DIAS: 15 cm/s
LICADSYS: -93 cm/s
Left CCA prox sys: 122 cm/s
Left ICA dist dias: -14 cm/s
Left ICA prox dias: -13 cm/s
Left ICA prox sys: -85 cm/s
RCCADSYS: -61 cm/s
RCCAPDIAS: -8 cm/s
RCCAPSYS: -80 cm/s
RIGHT ECA DIAS: 0 cm/s
RIGHT VERTEBRAL DIAS: -14 cm/s

## 2016-06-30 LAB — CBC
HEMATOCRIT: 37.4 % — AB (ref 39.0–52.0)
Hemoglobin: 12.3 g/dL — ABNORMAL LOW (ref 13.0–17.0)
MCH: 28.3 pg (ref 26.0–34.0)
MCHC: 32.9 g/dL (ref 30.0–36.0)
MCV: 86 fL (ref 78.0–100.0)
PLATELETS: 276 10*3/uL (ref 150–400)
RBC: 4.35 MIL/uL (ref 4.22–5.81)
RDW: 15.3 % (ref 11.5–15.5)
WBC: 14.1 10*3/uL — ABNORMAL HIGH (ref 4.0–10.5)

## 2016-06-30 LAB — HEMOGLOBIN A1C
Hgb A1c MFr Bld: 4.6 % — ABNORMAL LOW (ref 4.8–5.6)
Mean Plasma Glucose: 85 mg/dL

## 2016-06-30 MED ORDER — PANTOPRAZOLE SODIUM 40 MG PO TBEC
40.0000 mg | DELAYED_RELEASE_TABLET | Freq: Every day | ORAL | Status: DC
  Administered 2016-06-30 – 2016-07-03 (×4): 40 mg via ORAL
  Filled 2016-06-30 (×4): qty 1

## 2016-06-30 MED ORDER — CLONIDINE HCL 0.1 MG PO TABS
0.1000 mg | ORAL_TABLET | Freq: Three times a day (TID) | ORAL | Status: DC
  Administered 2016-06-30 – 2016-07-04 (×11): 0.1 mg via ORAL
  Filled 2016-06-30 (×11): qty 1

## 2016-06-30 MED ORDER — HYDRALAZINE HCL 50 MG PO TABS
100.0000 mg | ORAL_TABLET | Freq: Three times a day (TID) | ORAL | Status: DC
  Administered 2016-06-30 – 2016-07-04 (×11): 100 mg via ORAL
  Filled 2016-06-30 (×11): qty 2

## 2016-06-30 NOTE — Evaluation (Signed)
Occupational Therapy Evaluation Patient Details Name: Andrew Perez MRN: [id number] DOB: 05-01-1965 Today's Date: 06/30/2016    History of Present Illness 51 year old male admitted 06/27/16 due to increase in symptoms evaluated [facility] - uncontrolled HTN, N/V, dizziness, diaphoresis, headache. Blood pressure was significantly high with systolic blood pressure ranging from 232/105, 212/100, 214/97.  PMH significant for HTN.  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    Clinical Impression   Patient evaluated by Occupational Therapy with no further acute OT needs identified. All education has been completed and the patient has no further questions. Pt is mod I with ADLs and appears back to baseline.   See below for any follow-up Occupational Therapy or equipment needs. OT is signing off. Thank you for this referral.      Follow Up Recommendations  No OT follow up    Equipment Recommendations       Recommendations for Other Services       Precautions / Restrictions Precautions Precautions: None Precaution Comments: watch BP Restrictions Weight Bearing Restrictions: No      Mobility Bed Mobility               General bed mobility comments: pt sitting in recliner  Transfers Overall transfer level: Modified independent Equipment used: None             General transfer comment: pt uses momentum, but this is baseline due to body habitus.    Balance Overall balance assessment: Modified Independent                                          ADL Overall ADL's : Modified independent                                             Vision Baseline Vision/History: Wears glasses Wears Glasses: At all times Patient Visual Report: No change from baseline Vision Assessment?: No apparent visual deficits Additional Comments: Pt reports he has been reading without difficulty       Perception Perception Perception Tested?: Yes   Praxis Praxis Praxis tested?: Within functional limits    Pertinent Vitals/Pain Pain Assessment: No/denies pain     Hand Dominance Right   Extremity/Trunk Assessment Upper Extremity Assessment Upper Extremity Assessment: Overall WFL for tasks assessed (5/5)   Lower Extremity Assessment Lower Extremity Assessment: Defer to PT evaluation   Cervical / Trunk Assessment Cervical / Trunk Assessment: Normal   Communication Communication Communication: No difficulties   Cognition Arousal/Alertness: Awake/alert Behavior During Therapy: WFL for tasks assessed/performed Overall Cognitive Status: Within Functional Limits for tasks assessed                     General Comments  reviewed fatigue and progressive return to work and activity modification.  Pt able to state signs/symptoms of stroke with min cues     Exercises       Shoulder Instructions      Home Living Family/patient expects to be discharged to:: Private residence Living Arrangements: Spouse/significant other Available Help at Discharge: Family Type of Home: House Home Access: Stairs to enter Technical brewer of Steps: 6 Entrance Stairs-Rails: Right Home Layout: One level     Bathroom Shower/Tub:  Tub/shower unit;Walk-in shower   Bathroom Toilet: Handicapped height     Home Equipment: Shower seat;Grab bars - tub/shower;Grab bars - toilet          Prior Functioning/Environment Level of Independence: Independent        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: Decreased activity tolerance      OT Treatment/Interventions:      OT Goals(Current goals can be found in the care plan section) Acute Rehab OT Goals Patient Stated Goal: To return to work OT Goal Formulation: All assessment and education complete, DC therapy  OT Frequency:     Barriers to D/C:            Co-evaluation              End of Session     Activity Tolerance: Patient tolerated treatment well Patient left: in chair;with call bell/phone within reach  OT Visit Diagnosis: Unsteadiness on feet (R26.81)                ADL either performed or assessed with clinical judgement  Time: 6160-7371 OT Time Calculation (min): 9 min Charges:  OT General Charges $OT Visit: 1 Procedure OT Evaluation $OT Eval Low Complexity: 1 Procedure G-Codes:     Lucille Passy, OTR/L [phone]   Lucille Passy M 06/30/2016, 4:31 PM

## 2016-06-30 NOTE — Progress Notes (Signed)
STROKE TEAM PROGRESS NOTE   SUBJECTIVE (INTERVAL HISTORY) Pt RN at bedside. Pt still on cleviprex and BP stable. Neuro stable. Will increase PO BP meds. Also will do malignant HTN work up. Wean of cleviprex as able.     OBJECTIVE Temp:  [97.7 F (36.5 C)-99.2 F (37.3 C)] 99.2 F (37.3 C) (03/15 1539) Pulse Rate:  [54-83] 65 (03/15 1900) Cardiac Rhythm: Sinus bradycardia (03/15 1900) Resp:  [9-27] 18 (03/15 1900) BP: (111-183)/(43-89) 138/74 (03/15 1900) SpO2:  [85 %-99 %] 94 % (03/15 1900)  CBC:   Recent Labs Lab 06/29/16 0242 06/30/16 0337  WBC 13.8* 14.1*  HGB 12.0* 12.3*  HCT 36.8* 37.4*  MCV 84.6 86.0  PLT 287 161    Basic Metabolic Panel:   Recent Labs Lab 06/29/16 0242 06/30/16 0337  NA 140 139  K 3.2* 3.7  CL 107 104  CO2 28 26  GLUCOSE 102* 98  BUN 9 14  CREATININE 0.76 0.79  CALCIUM 9.0 9.1    Lipid Panel:     Component Value Date/Time   CHOL 148 06/29/2016 0242   TRIG 73 06/29/2016 0242   HDL 36 (L) 06/29/2016 0242   CHOLHDL 4.1 06/29/2016 0242   VLDL 15 06/29/2016 0242   LDLCALC 97 06/29/2016 0242   HgbA1c:  Lab Results  Component Value Date   HGBA1C 4.6 (L) 06/29/2016   Urine Drug Screen:     Component Value Date/Time   LABOPIA NONE DETECTED 06/28/2016 1744   COCAINSCRNUR NONE DETECTED 06/28/2016 1744   LABBENZ NONE DETECTED 06/28/2016 1744   AMPHETMU NONE DETECTED 06/28/2016 1744   THCU NONE DETECTED 06/28/2016 1744   LABBARB NONE DETECTED 06/28/2016 1744      IMAGING I have personally reviewed the radiological images below and agree with the radiology interpretations.  Ct Head Wo Contrast 06/28/2016 Mass again demonstrated in the sella and sphenoid sinuses that could represent a sellar tumor or possibly a large aneurysm arising from the cavernous carotid artery. Focal hyperintensity in the region of the fourth ventricle is more prominent than on the previous study and may represent metastatic lesion, focal hemorrhage, or  meningioma. Progression since previous study is not excluded. Suggest CT angiography for additional evaluation.   Ct Angio Head W Or Wo Contrast 06/28/2016 1. No intracranial aneurysm identified.  No large vessel occlusion. 2. Heterogeneous, infiltrative sellar mass as previously described and more fully characterized on outside brain MRI. 3. Hyperdensity in the fourth ventricle suggests a small amount of acute hemorrhage.   Mr [name] Wo Contrast 06/28/2016 Primary central skullbase mass, with dedicated imaging done at outside institution. 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 Small amount of intraventricular hemorrhage, no ventricular mass. This could reflect isolated IVH due to hypertension, and is unlikely related to sellar lesion. Mild chronic small vessel ischemic disease. Old RIGHT basal ganglia lacunar infarct.   2-D echocardiogram - Left ventricle: The cavity size was mildly dilated. Wall thickness was increased in a pattern of moderate LVH. Systolic function was normal. The estimated ejection fraction was in the range of 55% to 60%. Wall motion was normal; there were no regional wall motion abnormalities. Features are consistent with a pseudonormal left ventricular filling pattern, with concomitant abnormal relaxation and increased filling pressure (grade 2 diastolic dysfunction). Doppler parameters are consistent with high ventricular filling pressure. - Aortic root: The aortic root was mildly dilated. - Left atrium: The atrium was moderately dilated. Impressions:   Normal LV systolic function;  moderate LVH; grade 2 diastolic dysfunction with elevated LV filling pressure; mildly dilated aortic root; moderate LAE.  CUS 1-39 percent stenosis involving the right internal carotid artery and the left internal carotid artery. The vertebral arteries demonstrate antegrade flow.  Renal artery duplex pending   PHYSICAL EXAM  Temp:   [97.7 F (36.5 C)-99.2 F (37.3 C)] 99.2 F (37.3 C) (03/15 1539) Pulse Rate:  [54-83] 65 (03/15 1900) Resp:  [9-27] 18 (03/15 1900) BP: (111-183)/(43-89) 138/74 (03/15 1900) SpO2:  [85 %-99 %] 94 % (03/15 1900)  General - morbid obesity, well developed, in no apparent distress.  Ophthalmologic - Fundi not visualized due to eye movement.  Cardiovascular - Regular rate and rhythm.  Mental Status -  Level of arousal and orientation to time, place, and person were intact. Language including expression, naming, repetition, comprehension was assessed and found intact. Attention span and concentration were normal. Fund of Knowledge was assessed and was intact.  Cranial Nerves II - XII - II - Visual field intact OU. III, IV, VI - Extraocular movements intact. V - Facial sensation intact bilaterally. VII - Facial movement intact bilaterally. VIII - Hearing & vestibular intact bilaterally. X - Palate elevates symmetrically. XI - Chin turning & shoulder shrug intact bilaterally. XII - Tongue protrusion intact.  Motor Strength - The patient's strength was normal in all extremities and pronator drift was absent.  Bulk was normal and fasciculations were absent.   Motor Tone - Muscle tone was assessed at the neck and appendages and was normal.  Reflexes - The patient's reflexes were 1+ in all extremities and he had no pathological reflexes.  Sensory - Light touch, temperature/pinprick were assessed and were symmetrical.    Coordination - The patient had normal movements in the hands with no ataxia or dysmetria.  Tremor was absent.  Gait and Station - deferred   ASSESSMENT/PLAN Mr. DERIUS GHOSH is a 51 y.o. male with history of HTN and known pituitary tumor presenting with severe HA. CT showed IV ventricle small IVH which is confirmed by MRI.   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 Focal hyperintensity fourth ventricle, more  prominent.  CTA head no aneurysm, no LVO. Infiltrative sellar mass. Hyperdensity fourth ventricle suggests hemorrhage   MRI  primary central skull base mass. Small IVH, no ventricular mass, unlikely to be related to sellar lesion. 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 Moderate LVH. No source of embolus.  LDL 97  HgbA1c 4.6  SCDs for VTE prophylaxis Diet Heart Room service appropriate? 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  Hyperlipidemia  Home meds:  No statin  LDL 97  No need to start statin in setting of hemorrhage  Morbid obesity  BMI 53.7  recommend weight loss, diet and exercise as appropriate   Other Stroke Risk Factors  UDS negative   Other Active Problems  Hypokalemia - supplement  leukocystosis - 13.8  HIV negative  TSH normal  Hospital day # 2  This patient is critically ill due to IVH and suprasellar mass and at significant risk of neurological worsening, death form hydrocephalus, increased IVH, pituitary apoplexy. This patient's care requires constant monitoring of vital signs, hemodynamics, respiratory and cardiac monitoring, review of multiple databases, neurological assessment, discussion with  family, other specialists and medical decision making of high complexity. I spent 35 minutes of neurocritical care time in the care of this patient.  Rosalin Hawking, MD PhD Stroke Neurology 06/30/2016 8:01 PM   To contact Stroke Continuity provider, please refer to [email or web address] After hours, contact General Neurology

## 2016-06-30 NOTE — Progress Notes (Signed)
Physical Therapy Treatment and Discharge Patient Details Name: Andrew Perez MRN: [id number] DOB: 12-Dec-1965 Today's Date: 06/30/2016    History of Present Illness 51 year old male admitted 06/27/16 due to increase in symptoms evaluated at [facility] - uncontrolled HTN, N/V, dizziness, diaphoresis, headache. Blood pressure was significantly high with systolic blood pressure ranging from 232/105, 212/100, 214/97.  PMH significant for HTN.  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     PT Comments    Pt doing great and indicates feeling he is is back to baseline.   Pt able to ambulate unit independently and without deficit.  Feel pt no longer requires PT services at this time.  Will sign off.    Follow Up Recommendations  No PT follow up     Equipment Recommendations  None recommended by PT    Recommendations for Other Services       Precautions / Restrictions Precautions Precautions: None Precaution Comments: watch BP Restrictions Weight Bearing Restrictions: No    Mobility  Bed Mobility               General bed mobility comments: pt sitting in recliner  Transfers Overall transfer level: Modified independent Equipment used: None             General transfer comment: pt uses momentum, but this is baseline due to body habitus.  Ambulation/Gait Ambulation/Gait assistance: Modified independent (Device/Increase time) Ambulation Distance (Feet): 300 Feet Assistive device: None Gait Pattern/deviations: Step-through pattern;Decreased stride length     General Gait Details: pt with wide BOS and somewhat waddled gait, though this is baseline due to body habitus.     Stairs            Wheelchair Mobility    Modified Rankin (Stroke Patients Only) Modified Rankin (Stroke Patients Only) Pre-Morbid Rankin Score: No significant disability Modified Rankin: Slight disability     Balance Overall balance  assessment: Modified Independent                                  Cognition Arousal/Alertness: Awake/alert Behavior During Therapy: WFL for tasks assessed/performed Overall Cognitive Status: Within Functional Limits for tasks assessed                      Exercises      General Comments        Pertinent Vitals/Pain Pain Assessment: No/denies pain    Home Living                      Prior Function            PT Goals (current goals can now be found in the care plan section) Acute Rehab PT Goals Patient Stated Goal: To return to work PT Goal Formulation: With patient Time For Goal Achievement: 07/06/16 Potential to Achieve Goals: Good Progress towards PT goals: Goals met/education completed, patient discharged from PT    Frequency    Min 4X/week      PT Plan Current plan remains appropriate    Co-evaluation             End of Session   Activity Tolerance: Patient tolerated treatment well Patient left: in chair;with call bell/phone within reach Nurse Communication: Mobility status PT Visit Diagnosis: Unsteadiness on feet (R26.81);Other abnormalities of gait and mobility (R26.89)     Time: 4970-2637  PT Time Calculation (min) (ACUTE ONLY): 16 min  Charges:  $Gait Training: 8-22 mins                    G CodesThornton Papas Vander, [place] [phone] 06/30/2016, 3:19 PM

## 2016-07-01 ENCOUNTER — Inpatient Hospital Stay (HOSPITAL_COMMUNITY): Payer: PRIVATE HEALTH INSURANCE

## 2016-07-01 DIAGNOSIS — I161 Hypertensive emergency: Secondary | ICD-10-CM

## 2016-07-01 LAB — BASIC METABOLIC PANEL
ANION GAP: 9 (ref 5–15)
BUN: 18 mg/dL (ref 6–20)
CHLORIDE: 103 mmol/L (ref 101–111)
CO2: 27 mmol/L (ref 22–32)
CREATININE: 0.94 mg/dL (ref 0.61–1.24)
Calcium: 9.1 mg/dL (ref 8.9–10.3)
GFR calc non Af Amer: >60 mL/min (ref >60)
Glucose, Bld: 104 mg/dL — ABNORMAL HIGH (ref 65–99)
POTASSIUM: 3.8 mmol/L (ref 3.5–5.1)
SODIUM: 139 mmol/L (ref 135–145)

## 2016-07-01 LAB — CBC
HEMATOCRIT: 38.1 % — AB (ref 39.0–52.0)
HEMOGLOBIN: 12.5 g/dL — AB (ref 13.0–17.0)
MCH: 28.2 pg (ref 26.0–34.0)
MCHC: 32.8 g/dL (ref 30.0–36.0)
MCV: 85.8 fL (ref 78.0–100.0)
Platelets: 264 10*3/uL (ref 150–400)
RBC: 4.44 MIL/uL (ref 4.22–5.81)
RDW: 15.3 % (ref 11.5–15.5)
WBC: 13 10*3/uL — AB (ref 4.0–10.5)

## 2016-07-01 MED ORDER — LABETALOL HCL 5 MG/ML IV SOLN: 10.0000 mg | INTRAVENOUS | Status: DC | PRN

## 2016-07-01 MED ORDER — CABERGOLINE 0.5 MG PO TABS
0.5000 mg | ORAL_TABLET | ORAL | Status: DC
  Administered 2016-07-02: 0.25 mg via ORAL
  Filled 2016-07-01 (×2): qty 1

## 2016-07-01 NOTE — Progress Notes (Signed)
Pt home med (dostinex) delivered to [facility] main pharmacy.

## 2016-07-01 NOTE — Progress Notes (Signed)
*  PRELIMINARY RESULTS* Vascular Ultrasound Renal Artery Duplex has been completed.   Study was very technically limited and difficult due to patient body habitus, depth of vessels and organs, and overlying bowel gas. There is no obvious evidence of renal artery stenosis, however due to technical limitations the majority of vessels were unable to be visualized, making the study grossly inconclusive.  07/01/2016 9:25 AM Maudry Mayhew, BS, RVT, RDCS, RDMS

## 2016-07-01 NOTE — Progress Notes (Signed)
STROKE TEAM PROGRESS NOTE   SUBJECTIVE (INTERVAL HISTORY) No family is at bedside. Pt is off cleviprex and BP stable on 5 BP meds. Neuro stable. Renal artery duplex difficult test, but no significant RAS bilaterally. Transfer to floor   OBJECTIVE Temp:  [97.7 F (36.5 C)-99.2 F (37.3 C)] 98.1 F (36.7 C) (03/16 0400) Pulse Rate:  [53-80] 55 (03/16 0700) Cardiac Rhythm: Sinus bradycardia (03/16 0400) Resp:  [9-27] 15 (03/16 0700) BP: (107-179)/(55-89) 126/62 (03/16 0700) SpO2:  [88 %-99 %] 95 % (03/16 0700)  CBC:   Recent Labs Lab 06/30/16 0337 07/01/16 0314  WBC 14.1* 13.0*  HGB 12.3* 12.5*  HCT 37.4* 38.1*  MCV 86.0 85.8  PLT 276 353    Basic Metabolic Panel:   Recent Labs Lab 06/30/16 0337 07/01/16 0314  NA 139 139  K 3.7 3.8  CL 104 103  CO2 26 27  GLUCOSE 98 104*  BUN 14 18  CREATININE 0.79 0.94  CALCIUM 9.1 9.1    Lipid Panel:     Component Value Date/Time   CHOL 148 06/29/2016 0242   TRIG 73 06/29/2016 0242   HDL 36 (L) 06/29/2016 0242   CHOLHDL 4.1 06/29/2016 0242   VLDL 15 06/29/2016 0242   LDLCALC 97 06/29/2016 0242   HgbA1c:  Lab Results  Component Value Date   HGBA1C 4.6 (L) 06/29/2016   Urine Drug Screen:     Component Value Date/Time   LABOPIA NONE DETECTED 06/28/2016 1744   COCAINSCRNUR NONE DETECTED 06/28/2016 1744   LABBENZ NONE DETECTED 06/28/2016 1744   AMPHETMU NONE DETECTED 06/28/2016 1744   THCU NONE DETECTED 06/28/2016 1744   LABBARB NONE DETECTED 06/28/2016 1744      IMAGING I have personally reviewed the radiological images below and agree with the radiology interpretations.  Ct Head Wo Contrast 06/28/2016 Mass again demonstrated in the sella and sphenoid sinuses that could represent a sellar tumor or possibly a large aneurysm arising from the cavernous carotid artery. Focal hyperintensity in the region of the fourth ventricle is more prominent than on the previous study and may represent metastatic lesion, focal  hemorrhage, or meningioma. Progression since previous study is not excluded. Suggest CT angiography for additional evaluation.   Ct Angio Head W Or Wo Contrast 06/28/2016 1. No intracranial aneurysm identified.  No large vessel occlusion. 2. Heterogeneous, infiltrative sellar mass as previously described and more fully characterized on outside brain MRI. 3. Hyperdensity in the fourth ventricle suggests a small amount of acute hemorrhage.   Mr [name] Wo Contrast 06/28/2016 Primary central skullbase mass, with dedicated imaging done at outside institution. 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 Small amount of intraventricular hemorrhage, no ventricular mass. This could reflect isolated IVH due to hypertension, and is unlikely related to sellar lesion. Mild chronic small vessel ischemic disease. Old RIGHT basal ganglia lacunar infarct.   2-D echocardiogram - Left ventricle: The cavity size was mildly dilated. Wall thickness was increased in a pattern of moderate LVH. Systolic function was normal. The estimated ejection fraction was in the range of 55% to 60%. Wall motion was normal; there were no regional wall motion abnormalities. Features are consistent with a pseudonormal left ventricular filling pattern, with concomitant abnormal relaxation and increased filling pressure (grade 2 diastolic dysfunction). Doppler parameters are consistent with high ventricular filling pressure. - Aortic root: The aortic root was mildly dilated. - Left atrium: The atrium was moderately dilated. Impressions:   Normal LV systolic function; moderate  LVH; grade 2 diastolic dysfunction with elevated LV filling pressure; mildly dilated aortic root; moderate LAE.  CUS 1-39 percent stenosis involving the right internal carotid artery and the left internal carotid artery. The vertebral arteries demonstrate antegrade flow.  Renal artery duplex  07/01/2016 Study  was very technically limited and difficult due to patient body habitus, depth of vessels and organs, and overlying bowel gas. There is no obvious evidence of renal artery stenosis, however due to technical limitations the majority of vessels were unable to be visualized, making the study grossly inconclusive.     PHYSICAL EXAM  Temp:  [97.7 F (36.5 C)-99.2 F (37.3 C)] 98.1 F (36.7 C) (03/16 0400) Pulse Rate:  [53-80] 55 (03/16 0700) Resp:  [9-27] 15 (03/16 0700) BP: (107-179)/(55-89) 126/62 (03/16 0700) SpO2:  [88 %-99 %] 95 % (03/16 0700)  General - morbid obesity, well developed, in no apparent distress.  Ophthalmologic - Fundi not visualized due to eye movement.  Cardiovascular - Regular rate and rhythm.  Mental Status -  Level of arousal and orientation to time, place, and person were intact. Language including expression, naming, repetition, comprehension was assessed and found intact. Attention span and concentration were normal. Fund of Knowledge was assessed and was intact.  Cranial Nerves II - XII - II - Visual field intact OU. III, IV, VI - Extraocular movements intact. V - Facial sensation intact bilaterally. VII - Facial movement intact bilaterally. VIII - Hearing & vestibular intact bilaterally. X - Palate elevates symmetrically. XI - Chin turning & shoulder shrug intact bilaterally. XII - Tongue protrusion intact.  Motor Strength - The patient's strength was normal in all extremities and pronator drift was absent.  Bulk was normal and fasciculations were absent.   Motor Tone - Muscle tone was assessed at the neck and appendages and was normal.  Reflexes - The patient's reflexes were 1+ in all extremities and he had no pathological reflexes.  Sensory - Light touch, temperature/pinprick were assessed and were symmetrical.    Coordination - The patient had normal movements in the hands with no ataxia or dysmetria.  Tremor was absent.  Gait and Station -  deferred   ASSESSMENT/PLAN Andrew Perez is a 51 y.o. male with history of HTN and known pituitary tumor presenting with severe HA. CT showed IV ventricle small IVH which is confirmed by MRI.   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 Focal hyperintensity fourth ventricle, more prominent.  CTA head no aneurysm, no LVO. Infiltrative sellar mass. Hyperdensity fourth ventricle suggests hemorrhage   MRI  primary central skull base mass. Small IVH, no ventricular mass, unlikely to be related to sellar lesion. 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 Moderate LVH. No source of embolus.  LDL 97  HgbA1c 4.6  SCDs for VTE prophylaxis Diet Heart Room service appropriate? 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 Added clonidine too  Off cleviprex for now  Secondary HTN labs - pending  Renal Artery Duplex - technically limited study - There is no obvious evidence of renal artery stenosis.  Hyperlipidemia  Home meds:  No statin  LDL 97  No need to start statin in setting of hemorrhage  Morbid obesity  BMI 53.7  recommend weight loss, diet and exercise as appropriate   Other Stroke Risk Factors  UDS negative   Other Active Problems  Hypokalemia -  supplement  leukocystosis - 13.8  HIV negative  TSH normal  Hospital day # 3  This patient is critically ill due to IVH and suprasellar mass and at significant risk of neurological worsening, death form hydrocephalus, increased IVH, pituitary apoplexy. This patient's care requires constant monitoring of vital signs, hemodynamics, respiratory and cardiac monitoring, review of multiple databases, neurological assessment, discussion with family, other specialists and medical decision making of high complexity. I spent 35 minutes of neurocritical care time in the care of this patient.  Rosalin Hawking, MD PhD Stroke Neurology 07/01/2016 10:24 PM   To contact Stroke Continuity provider, please refer to [email or web address] After hours, contact General Neurology

## 2016-07-02 LAB — PROLACTIN: PROLACTIN: 435.1 ng/mL — AB (ref 4.0–15.2)

## 2016-07-02 MED ORDER — POLYETHYLENE GLYCOL 3350 17 G PO PACK
17.0000 g | PACK | Freq: Every day | ORAL | Status: DC
  Administered 2016-07-02 – 2016-07-04 (×3): 17 g via ORAL
  Filled 2016-07-02 (×3): qty 1

## 2016-07-02 NOTE — Progress Notes (Addendum)
STROKE TEAM PROGRESS NOTE   SUBJECTIVE (INTERVAL HISTORY) No family is at bedside. Pt is off cleviprex and BP stable on 5 BP meds. Neuro stable. Renal artery duplex apparently difficult test, but no significant RAS bilaterally. Transfered to floor yesterday.  No events overnight   OBJECTIVE Temp:  [98.1 F (36.7 C)-98.8 F (37.1 C)] 98.4 F (36.9 C) (03/17 1003) Pulse Rate:  [54-61] 54 (03/17 1003) Cardiac Rhythm: Heart block;Bundle branch block (03/17 0700) Resp:  [16-20] 17 (03/17 1003) BP: (120-160)/(54-77) 120/54 (03/17 1003) SpO2:  [95 %-99 %] 99 % (03/17 1003)  CBC:   Recent Labs Lab 06/30/16 0337 07/01/16 0314  WBC 14.1* 13.0*  HGB 12.3* 12.5*  HCT 37.4* 38.1*  MCV 86.0 85.8  PLT 276 427    Basic Metabolic Panel:   Recent Labs Lab 06/30/16 0337 07/01/16 0314  NA 139 139  K 3.7 3.8  CL 104 103  CO2 26 27  GLUCOSE 98 104*  BUN 14 18  CREATININE 0.79 0.94  CALCIUM 9.1 9.1    Lipid Panel:     Component Value Date/Time   CHOL 148 06/29/2016 0242   TRIG 73 06/29/2016 0242   HDL 36 (L) 06/29/2016 0242   CHOLHDL 4.1 06/29/2016 0242   VLDL 15 06/29/2016 0242   LDLCALC 97 06/29/2016 0242   HgbA1c:  Lab Results  Component Value Date   HGBA1C 4.6 (L) 06/29/2016   Urine Drug Screen:     Component Value Date/Time   LABOPIA NONE DETECTED 06/28/2016 1744   COCAINSCRNUR NONE DETECTED 06/28/2016 1744   LABBENZ NONE DETECTED 06/28/2016 1744   AMPHETMU NONE DETECTED 06/28/2016 1744   THCU NONE DETECTED 06/28/2016 1744   LABBARB NONE DETECTED 06/28/2016 1744      IMAGING I have personally reviewed the radiological images below and agree with the radiology interpretations.  Ct Head Wo Contrast 06/28/2016 Mass again demonstrated in the sella and sphenoid sinuses that could represent a sellar tumor or possibly a large aneurysm arising from the cavernous carotid artery. Focal hyperintensity in the region of the fourth ventricle is more prominent than on the  previous study and may represent metastatic lesion, focal hemorrhage, or meningioma. Progression since previous study is not excluded. Suggest CT angiography for additional evaluation.   Ct Angio Head W Or Wo Contrast 06/28/2016 1. No intracranial aneurysm identified.  No large vessel occlusion. 2. Heterogeneous, infiltrative sellar mass as previously described and more fully characterized on outside brain MRI. 3. Hyperdensity in the fourth ventricle suggests a small amount of acute hemorrhage.   Mr [name] Wo Contrast 06/28/2016 Primary central skullbase mass, with dedicated imaging done at outside institution. 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 Small amount of intraventricular hemorrhage, no ventricular mass. This could reflect isolated IVH due to hypertension, and is unlikely related to sellar lesion. Mild chronic small vessel ischemic disease. Old RIGHT basal ganglia lacunar infarct.   2-D echocardiogram - Left ventricle: The cavity size was mildly dilated. Wall thickness was increased in a pattern of moderate LVH. Systolic function was normal. The estimated ejection fraction was in the range of 55% to 60%. Wall motion was normal; there were no regional wall motion abnormalities. Features are consistent with a pseudonormal left ventricular filling pattern, with concomitant abnormal relaxation and increased filling pressure (grade 2 diastolic dysfunction). Doppler parameters are consistent with high ventricular filling pressure. - Aortic root: The aortic root was mildly dilated. - Left atrium: The atrium was moderately dilated.  Impressions:   Normal LV systolic function; moderate LVH; grade 2 diastolic dysfunction with elevated LV filling pressure; mildly dilated aortic root; moderate LAE.  CUS 1-39 percent stenosis involving the right internal carotid artery and the left internal carotid artery. The vertebral arteries demonstrate  antegrade flow.  Renal artery duplex  07/01/2016 Study was very technically limited and difficult due to patient body habitus, depth of vessels and organs, and overlying bowel gas. There is no obvious evidence of renal artery stenosis, however due to technical limitations the majority of vessels were unable to be visualized, making the study grossly inconclusive.     PHYSICAL EXAM  Temp:  [98.1 F (36.7 C)-98.8 F (37.1 C)] 98.4 F (36.9 C) (03/17 1003) Pulse Rate:  [54-61] 54 (03/17 1003) Resp:  [16-20] 17 (03/17 1003) BP: (120-160)/(54-77) 120/54 (03/17 1003) SpO2:  [95 %-99 %] 99 % (03/17 1003)  General - morbid obesity, well developed, in no apparent distress.  Ophthalmologic - Fundi not visualized due to eye movement.  Cardiovascular - Regular rate and rhythm.  Mental Status -  Level of arousal and orientation to time, place, and person were intact. Language including expression, naming, repetition, comprehension was assessed and found intact. Attention span and concentration were normal. Fund of Knowledge was assessed and was intact.  Cranial Nerves II - XII - II - Visual field intact OU. III, IV, VI - Extraocular movements intact. V - Facial sensation intact bilaterally. VII - Facial movement intact bilaterally. VIII - Hearing & vestibular intact bilaterally. X - Palate elevates symmetrically. XI - Chin turning & shoulder shrug intact bilaterally. XII - Tongue protrusion intact.  Motor Strength - The patient's strength was normal in all extremities and pronator drift was absent.  Bulk was normal and fasciculations were absent.   Motor Tone - Muscle tone was assessed at the neck and appendages and was normal.  Reflexes - The patient's reflexes were 1+ in all extremities and he had no pathological reflexes.  Sensory - Light touch, temperature/pinprick were assessed and were symmetrical.    Coordination - The patient had normal movements in the hands with no  ataxia or dysmetria.  Tremor was absent.  Gait and Station - deferred   ASSESSMENT/PLAN Andrew Perez is a 51 y.o. male with history of HTN and known pituitary tumor presenting with severe HA. CT showed IV ventricle small IVH which is confirmed by MRI.   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 Focal hyperintensity fourth ventricle, more prominent.  CTA head no aneurysm, no LVO. Infiltrative sellar mass. Hyperdensity fourth ventricle suggests hemorrhage   MRI  primary central skull base mass. Small IVH, no ventricular mass, unlikely to be related to sellar lesion. 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 Moderate LVH. No source of embolus.  LDL 97  HgbA1c 4.6  SCDs for VTE prophylaxis Diet Heart Room service appropriate? Yes; Fluid consistency: Thin  No antithrombotic prior to admission  Ongoing aggressive stroke risk factor management  Therapy recommendations:  No follow-up therapies recommended.  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 Added clonidine too  Off cleviprex for now  Secondary HTN labs - pending  Renal Artery Duplex - technically limited study - There is no obvious evidence of renal artery stenosis.  Hyperlipidemia  Home meds:  No statin  LDL 97  No need to start statin in setting of hemorrhage  Morbid obesity  BMI 53.7  recommend weight loss, diet and exercise as appropriate   Other  Stroke Risk Factors  UDS negative   Other Active Problems  Hypokalemia - supplement  leukocystosis - 13.8  HIV negative  TSH normal  Hospital day # 4   ATTENDING NOTE: Patient was seen and examined by me personally. Documentation reflects findings. The laboratory and radiographic studies reviewed by me. ROS completed by me personally and pertinent positives fully documented  Condition: stable  Assessment and plan completed by me personally and fully documented above. Plans/Recommendations include:     Continue to monitor BPs; will check for orthostatics  Leukocytosis improving; will continue to follow  Will curbside Endocrine for current management of sella mass until patient followed up by neruosurgery as outpatient  Ordered Miralax  SIGNED BY: Dr. Elissa Hefty      To contact Stroke Continuity provider, please refer to [email or web address] After hours, contact General Neurology

## 2016-07-03 LAB — CBC
HCT: 36.3 % — ABNORMAL LOW (ref 39.0–52.0)
Hemoglobin: 11.9 g/dL — ABNORMAL LOW (ref 13.0–17.0)
MCH: 28.1 pg (ref 26.0–34.0)
MCHC: 32.8 g/dL (ref 30.0–36.0)
MCV: 85.8 fL (ref 78.0–100.0)
PLATELETS: 213 10*3/uL (ref 150–400)
RBC: 4.23 MIL/uL (ref 4.22–5.81)
RDW: 15.4 % (ref 11.5–15.5)
WBC: 10.1 10*3/uL (ref 4.0–10.5)

## 2016-07-03 LAB — COMPREHENSIVE METABOLIC PANEL
ALT: 16 U/L — ABNORMAL LOW (ref 17–63)
ANION GAP: 6 (ref 5–15)
AST: 11 U/L — ABNORMAL LOW (ref 15–41)
Albumin: 3.3 g/dL — ABNORMAL LOW (ref 3.5–5.0)
Alkaline Phosphatase: 59 U/L (ref 38–126)
BUN: 20 mg/dL (ref 6–20)
CHLORIDE: 105 mmol/L (ref 101–111)
CO2: 29 mmol/L (ref 22–32)
Calcium: 9.1 mg/dL (ref 8.9–10.3)
Creatinine, Ser: 1.04 mg/dL (ref 0.61–1.24)
Glucose, Bld: 102 mg/dL — ABNORMAL HIGH (ref 65–99)
Potassium: 3.9 mmol/L (ref 3.5–5.1)
Sodium: 140 mmol/L (ref 135–145)
TOTAL PROTEIN: 6 g/dL — AB (ref 6.5–8.1)
Total Bilirubin: 1 mg/dL (ref 0.3–1.2)

## 2016-07-03 MED ORDER — HYDRALAZINE HCL 100 MG PO TABS: 100.0000 mg | ORAL_TABLET | Freq: Three times a day (TID) | ORAL | 6 refills | Status: DC

## 2016-07-03 MED ORDER — AMLODIPINE BESYLATE 10 MG PO TABS: 10.0000 mg | ORAL_TABLET | Freq: Every day | ORAL | 6 refills | Status: DC

## 2016-07-03 MED ORDER — CLONIDINE HCL 0.1 MG PO TABS: ORAL_TABLET | ORAL | 6 refills | Status: DC

## 2016-07-03 MED ORDER — LISINOPRIL 20 MG PO TABS: 20.0000 mg | ORAL_TABLET | Freq: Two times a day (BID) | ORAL | 6 refills | Status: DC

## 2016-07-03 NOTE — Discharge Summary (Signed)
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  Past Medical History:  Diagnosis Date  . Hypertension   . Sleep apnea    Past Surgical History:  Procedure Laterality Date  . TONSILLECTOMY      Allergies as of 07/03/2016      Reactions   Codeine Shortness Of Breath   Shellfish Allergy Shortness Of Breath      Medication List    TAKE these medications   amLODipine 10 MG tablet Commonly known as:  NORVASC Take 1 tablet (10 mg total) by mouth daily. Start taking on:  07/04/2016   cabergoline 0.5 MG tablet Commonly known as:  DOSTINEX Take 0.5 mg by mouth 2 (two) times a week. Friday and Tuesday   cloNIDine 0.1 MG tablet Commonly known as:  CATAPRES One tablet three times daily   hydrALAZINE 100 MG tablet Commonly known as:  APRESOLINE Take 1 tablet (100 mg total) by mouth every 8 (eight) hours.   lisinopril 20 MG tablet Commonly known as:  PRINIVIL,ZESTRIL Take 1 tablet (20 mg total) by mouth 2 (two) times daily. What changed:  medication strength  how much to take  when to take this   promethazine 25 MG tablet Commonly known as:  PHENERGAN Take 25 mg by mouth every 6 (six) hours as needed for nausea.       LABORATORY STUDIES CBC    Component Value Date/Time   WBC 10.1 07/03/2016 0430   RBC 4.23 07/03/2016 0430   HGB 11.9 (L) 07/03/2016 0430   HCT 36.3 (L) 07/03/2016 0430   PLT 213 07/03/2016 0430   MCV  85.8 07/03/2016 0430   MCH 28.1 07/03/2016 0430   MCHC 32.8 07/03/2016 0430   RDW 15.4 07/03/2016 0430   CMP    Component Value Date/Time   NA 140 07/03/2016 0430   K 3.9 07/03/2016 0430   CL 105 07/03/2016 0430   CO2 29 07/03/2016 0430   GLUCOSE 102 (H) 07/03/2016 0430   BUN 20 07/03/2016 0430   CREATININE 1.04 07/03/2016 0430   CALCIUM 9.1 07/03/2016 0430   PROT 6.0 (L) 07/03/2016 0430   ALBUMIN 3.3 (L) 07/03/2016 0430   AST 11 (L) 07/03/2016 0430   ALT 16 (L) 07/03/2016 0430   ALKPHOS 59 07/03/2016 0430   BILITOT 1.0 07/03/2016 0430   GFRNONAA >60 07/03/2016 0430   GFRAA >60 07/03/2016 0430   COAGSNo results found for: INR, PROTIME Lipid Panel    Component Value Date/Time   CHOL 148 06/29/2016 0242   TRIG 73 06/29/2016 0242   HDL 36 (L) 06/29/2016 0242   CHOLHDL 4.1 06/29/2016 0242   VLDL 15 06/29/2016 0242   LDLCALC 97 06/29/2016 0242   HgbA1C  Lab Results  Component Value Date   HGBA1C 4.6 (L) 06/29/2016   Urinalysis    Component Value Date/Time   COLORURINE YELLOW 06/28/2016 0117   APPEARANCEUR HAZY (A) 06/28/2016  0117   LABSPEC 1.016 06/28/2016 0117   PHURINE 7.0 06/28/2016 0117   GLUCOSEU NEGATIVE 06/28/2016 0117   HGBUR NEGATIVE 06/28/2016 0117   BILIRUBINUR NEGATIVE 06/28/2016 0117   KETONESUR NEGATIVE 06/28/2016 0117   PROTEINUR 100 (A) 06/28/2016 0117   NITRITE NEGATIVE 06/28/2016 0117   LEUKOCYTESUR NEGATIVE 06/28/2016 0117   Urine Drug Screen     Component Value Date/Time   LABOPIA NONE DETECTED 06/28/2016 1744   COCAINSCRNUR NONE DETECTED 06/28/2016 1744   LABBENZ NONE DETECTED 06/28/2016 1744   AMPHETMU NONE DETECTED 06/28/2016 1744   THCU NONE DETECTED 06/28/2016 1744   LABBARB NONE DETECTED 06/28/2016 1744      SIGNIFICANT DIAGNOSTIC STUDIES  Ct Head Wo Contrast 06/28/2016 Mass again demonstrated in the sella and sphenoid sinuses that could represent a sellar tumor or possibly a large aneurysm arising from the cavernous carotid  artery. Focal hyperintensity in the region of the fourth ventricle is more prominent than on the previous study and may represent metastatic lesion, focal hemorrhage, or meningioma. Progression since previous study is not excluded. Suggest CT angiography for additional evaluation.   Ct Angio Head W Or Wo Contrast 06/28/2016 1. No intracranial aneurysm identified.  No large vessel occlusion. 2. Heterogeneous, infiltrative sellar mass as previously described and more fully characterized on outside brain MRI. 3. Hyperdensity in the fourth ventricle suggests a small amount of acute hemorrhage.   Mr [name] Wo Contrast 06/28/2016 Primary central skullbase mass, with dedicated imaging done at outside institution. 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 Small amount of intraventricular hemorrhage, no ventricular mass. This could reflect isolated IVH due to hypertension, and is unlikely related to sellar lesion. Mild chronic small vessel ischemic disease. Old RIGHT basal ganglia lacunar infarct.   2-D echocardiogram - Left ventricle: The cavity size was mildly dilated. Wallthickness was increased in a pattern of moderate LVH. Systolicfunction was normal. The estimated ejection fraction was in therange of 55% to 60%. Wall motion was normal; there were noregional wall motion abnormalities. Features are consistent witha pseudonormal left ventricular filling pattern, with concomitantabnormal relaxation and increased filling pressure (grade 2diastolic dysfunction). Doppler parameters are consistent withhigh ventricular filling pressure. - Aortic root: The aortic root was mildly dilated. - Left atrium: The atrium was moderately dilated. Impressions:   Normal LV systolic function; moderate LVH; grade 2 diastolicdysfunction with elevated LV filling pressure; mildly dilatedaortic root; moderate LAE.  CUS 1-39 percent stenosis involving the  right internal carotid artery and the left internal carotid artery. The vertebral arteries demonstrate antegrade flow.  Renal artery duplex  07/01/2016 Study was very technically limited and difficult due to patient body habitus, depth of vessels and organs, and overlying bowel gas. There is no obviousevidence of renal artery stenosis, however due to technical limitations the majority of vessels were unable to be visualized, making the study grossly inconclusive.     HISTORY OF PRESENT ILLNESS ALROY PORTELA an 51 y.o.maleseen at  [facility] on 06/23/2016 with uncontrolled hypertension, nausea, vomiting, dizziness, diaphoresis and headache. 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 While in the ED at [facility] his blood pressures were systolically in the 735H. Patient was to follow up with neurosurgery as an outpatient. However last night patient awoke with a significant headache. Patient was brought to [place] brain was obtained. This showed a heterogeneous, infiltrative sellar mass as previously described and  more fully characterized on outside brain MRI and also now showed a hyperdensity in the fourth ventricle suggesting a small amount of acute hemorrhage. Initially on the emergency department patient's blood pressure was significantly high with systolic blood pressure ranging from 232/105, 212/100, 214/97. Patient was given medications and is currently down to 178/78. Patient states that his headache has improved. Neurology was asked to consult for the bleed and neurosurgery will be also seeing the patient for further evaluation of the pituitary tumor. Patient denies any visual changes, localizing or lateralizing numbness or weakness. Patient was last known well 06/27/2016 at 21:00. Patient was not administered IV t-PA secondary to [place]. He was admitted to the neuro ICU for further evaluation  and treatment.  Past Medical History:  Diagnosis Date  . Hypertension         Past Surgical History:  Procedure Laterality Date  . TONSILLECTOMY     No family history on file. Social History:  reports that he has never smoked. He has never used smokeless tobacco. He reports that he does not drink alcohol or use drugs.  Allergies:      Allergies  Allergen Reactions  . Codeine Shortness Of Breath  . Shellfish Allergy Eddystone COURSE Mr. CASS VANDERMEULEN is a 51 y.o. male with history of HTN and known pituitary tumor presenting with severe HA. CT showed IV ventricle small IVH which is confirmed by MRI.   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 Focal hyperintensity fourth ventricle, more prominent.  CTA head no aneurysm, no LVO. Infiltrative sellar mass. Hyperdensity fourth ventricle suggests hemorrhage   MRI  primary central skull base mass. Small IVH, no ventricular mass, unlikely to be related to sellar lesion. 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 Moderate LVH. No source of embolus.  LDL 97  HgbA1c 4.6  No antithrombotic prior to admission  Ongoing aggressive stroke risk factor management  Therapy recommendations:  No follow-up therapies recommended.  Disposition:   Discharge to home  Suprasellar mass  Dx recently at [place] saw in the 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 Added clonidine too  Off cleviprex for  now  Secondary HTN labs -  catecholamines, metanephrines, and aldosterone - pending  Renal Artery Duplex - technically limited study - There is no obviousevidence of renal artery stenosis.  Hyperlipidemia  Home meds:  No statin  LDL 97  No need to start statin in setting of hemorrhage  Morbid obesity  BMI 53.7  recommend weight loss, diet and exercise as appropriate   Other Stroke Risk Factors  UDS negative   Other Active Problems  Hypokalemia - supplement , resolved   leukocystosis - 13.0 -> 10.1, resolved  HIV negative  TSH normal   DISCHARGE EXAM Blood pressure 125/71, pulse 64, temperature 98 F (36.7 C), temperature source Oral, resp. rate 17, height 5\' 11"  (1.803 m), weight (!) 174.6 kg (385 lb), SpO2 98 %.  General - morbid obesity, well developed, in no apparent distress.  Ophthalmologic - Fundi not visualized due to eye movement.  Cardiovascular - Regular rate and rhythm.  Mental Status -  Level of arousal and orientation to time, place, and person were intact. Language including expression, naming, repetition, comprehension was assessed and found intact. Attention span and concentration were normal. Fund of Knowledge was assessed and was intact.  Cranial Nerves II - XII - II - Visual field intact OU. III, IV, VI - Extraocular movements intact. V - Facial sensation intact bilaterally. VII - Facial movement intact bilaterally. VIII - Hearing & vestibular intact bilaterally. X - Palate elevates symmetrically. XI - Chin turning & shoulder shrug intact bilaterally. XII - Tongue protrusion intact.  Motor Strength - The patient's strength was normal in all extremities and pronator drift was absent.  Bulk was normal and fasciculations were absent.   Motor Tone - Muscle tone was assessed at the neck and appendages and was normal.  Reflexes - The patient's reflexes were 1+ in all extremities and he had no pathological reflexes.  Sensory -  Light touch, temperature/pinprick were assessed and were symmetrical.    Coordination - The patient had normal movements in the hands with no ataxia or dysmetria.  Tremor was absent.  Gait and Station - deferred  Discharge Diet   Diet Heart Room service appropriate? Yes; Fluid consistency: Thin liquids   Patient discharge instructions:  1. Do not stop taking blood pressure medications unless instructed by a physician. 2. See your new primary care physician within 2 to 3 weeks. 3. [facility] up with neurosurgeons at [facility] as instructed. 4. Follow-up with the endocrinologist as instructed. 5. Follow-up with ophthalmologist as scheduled.   DISCHARGE PLAN  Disposition:  Discharged to home in stable and improved condition.  No antithrombotic for secondary stroke prevention due to hemorrhage.  Ongoing risk factor control by Primary Care Physician at time of discharge  Follow-up primary care M.D. in 2 weeks.(Patient states he has an appointment)  Follow-up with Dr.Wanda Rideout Leonie Man, Stroke Clinic in 6 weeks, office to schedule an appointment.  45 minutes were spent preparing discharge.  [place] for Pager information 07/04/2016 12:59 PM  I have personally examined this patient, reviewed notes, independently viewed imaging studies, participated in medical decision making and plan of care.ROS completed by me personally and pertinent positives fully documented  I have made any additions or clarifications directly to the above note. Agree with note above.    Antony Contras, MD Medical Director [facility] Pager: [phone] 07/04/2016 8:40 PM

## 2016-07-03 NOTE — Progress Notes (Signed)
STROKE TEAM PROGRESS NOTE   SUBJECTIVE (INTERVAL HISTORY) Wife is at bedside. We discussed in great detail the discharge plan.  Patient has been referred to PCP, ophthalmology, and endocrinology and [facility] by the neurosurgeon he say there.  The patient and wife are confident that they have an ability to set these appointments up.  They understand that it is essential that patient is seen in close follow-up.  Apparently the PCP appt. Will be this Wednesday.  I recommended that they get access to MyChart when they go since he will have multiple specialists to see.   OBJECTIVE Temp:  [98 F (36.7 C)-98.6 F (37 C)] 98.6 F (37 C) (03/18 1035) Pulse Rate:  [56-62] 62 (03/18 1035) Cardiac Rhythm: Heart block (03/18 0700) Resp:  [16-18] 18 (03/18 1035) BP: (127-152)/(63-71) 127/63 (03/18 1035) SpO2:  [96 %-99 %] 98 % (03/18 1035)  CBC:   Recent Labs Lab 07/01/16 0314 07/03/16 0430  WBC 13.0* 10.1  HGB 12.5* 11.9*  HCT 38.1* 36.3*  MCV 85.8 85.8  PLT 264 712    Basic Metabolic Panel:   Recent Labs Lab 07/01/16 0314 07/03/16 0430  NA 139 140  K 3.8 3.9  CL 103 105  CO2 27 29  GLUCOSE 104* 102*  BUN 18 20  CREATININE 0.94 1.04  CALCIUM 9.1 9.1    Lipid Panel:     Component Value Date/Time   CHOL 148 06/29/2016 0242   TRIG 73 06/29/2016 0242   HDL 36 (L) 06/29/2016 0242   CHOLHDL 4.1 06/29/2016 0242   VLDL 15 06/29/2016 0242   LDLCALC 97 06/29/2016 0242   HgbA1c:  Lab Results  Component Value Date   HGBA1C 4.6 (L) 06/29/2016   Urine Drug Screen:     Component Value Date/Time   LABOPIA NONE DETECTED 06/28/2016 1744   COCAINSCRNUR NONE DETECTED 06/28/2016 1744   LABBENZ NONE DETECTED 06/28/2016 1744   AMPHETMU NONE DETECTED 06/28/2016 1744   THCU NONE DETECTED 06/28/2016 1744   LABBARB NONE DETECTED 06/28/2016 1744      IMAGING I have personally reviewed the radiological images below and agree with the radiology interpretations.  Ct Head Wo  Contrast 06/28/2016 Mass again demonstrated in the sella and sphenoid sinuses that could represent a sellar tumor or possibly a large aneurysm arising from the cavernous carotid artery. Focal hyperintensity in the region of the fourth ventricle is more prominent than on the previous study and may represent metastatic lesion, focal hemorrhage, or meningioma. Progression since previous study is not excluded. Suggest CT angiography for additional evaluation.   Ct Angio Head W Or Wo Contrast 06/28/2016 1. No intracranial aneurysm identified.  No large vessel occlusion. 2. Heterogeneous, infiltrative sellar mass as previously described and more fully characterized on outside brain MRI. 3. Hyperdensity in the fourth ventricle suggests a small amount of acute hemorrhage.   Mr [name] Wo Contrast 06/28/2016 Primary central skullbase mass, with dedicated imaging done at outside institution. 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 Small amount of intraventricular hemorrhage, no ventricular mass. This could reflect isolated IVH due to hypertension, and is unlikely related to sellar lesion. Mild chronic small vessel ischemic disease. Old RIGHT basal ganglia lacunar infarct.   2-D echocardiogram - Left ventricle: The cavity size was mildly dilated. Wall thickness was increased in a pattern of moderate LVH. Systolic function was normal. The estimated ejection fraction was in the range of 55% to 60%. Wall motion was normal; there were no regional  wall motion abnormalities. Features are consistent with a pseudonormal left ventricular filling pattern, with concomitant abnormal relaxation and increased filling pressure (grade 2 diastolic dysfunction). Doppler parameters are consistent with high ventricular filling pressure. - Aortic root: The aortic root was mildly dilated. - Left atrium: The atrium was moderately dilated. Impressions:   Normal LV systolic  function; moderate LVH; grade 2 diastolic dysfunction with elevated LV filling pressure; mildly dilated aortic root; moderate LAE.  CUS 1-39 percent stenosis involving the right internal carotid artery and the left internal carotid artery. The vertebral arteries demonstrate antegrade flow.  Renal artery duplex  07/01/2016 Study was very technically limited and difficult due to patient body habitus, depth of vessels and organs, and overlying bowel gas. There is no obvious evidence of renal artery stenosis, however due to technical limitations the majority of vessels were unable to be visualized, making the study grossly inconclusive.     PHYSICAL EXAM  Temp:  [98 F (36.7 C)-98.6 F (37 C)] 98.6 F (37 C) (03/18 1035) Pulse Rate:  [56-62] 62 (03/18 1035) Resp:  [16-18] 18 (03/18 1035) BP: (127-152)/(63-71) 127/63 (03/18 1035) SpO2:  [96 %-99 %] 98 % (03/18 1035)  General - morbid obesity, well developed, in no apparent distress.  Ophthalmologic - Fundi not visualized due to eye movement.  Cardiovascular - Regular rate and rhythm.  Mental Status -  Level of arousal and orientation to time, place, and person were intact. Language including expression, naming, repetition, comprehension was assessed and found intact. Attention span and concentration were normal. Fund of Knowledge was assessed and was intact.  Cranial Nerves II - XII - II - Visual field intact OU. III, IV, VI - Extraocular movements intact. V - Facial sensation intact bilaterally. VII - Facial movement intact bilaterally. VIII - Hearing & vestibular intact bilaterally. X - Palate elevates symmetrically. XI - Chin turning & shoulder shrug intact bilaterally. XII - Tongue protrusion intact.  Motor Strength - The patient's strength was normal in all extremities and pronator drift was absent.  Bulk was normal and fasciculations were absent.   Motor Tone - Muscle tone was assessed at the neck and appendages and was  normal.  Reflexes - The patient's reflexes were 1+ in all extremities and he had no pathological reflexes.  Sensory - Light touch, temperature/pinprick were assessed and were symmetrical.    Coordination - The patient had normal movements in the hands with no ataxia or dysmetria.  Tremor was absent.  Gait and Station - deferred   ASSESSMENT/PLAN Andrew Perez is a 51 y.o. male with history of HTN and known pituitary tumor presenting with severe HA. CT showed IV ventricle small IVH which is confirmed by MRI.   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 Focal hyperintensity fourth ventricle, more prominent.  CTA head no aneurysm, no LVO. Infiltrative sellar mass. Hyperdensity fourth ventricle suggests hemorrhage   MRI  primary central skull base mass. Small IVH, no ventricular mass, unlikely to be related to sellar lesion. 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 Moderate LVH. No source of embolus.  LDL 97  HgbA1c 4.6  SCDs for VTE prophylaxis Diet Heart Room service appropriate? Yes; Fluid consistency: Thin  No antithrombotic prior to admission  Ongoing aggressive stroke risk factor management  Therapy recommendations:  No follow-up therapies recommended.  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 Clonidine   Off cleviprex for now  Secondary HTN labs - pending  Renal Artery Duplex - technically limited study -  There is no obvious evidence of renal artery stenosis.  Hyperlipidemia  Home meds:  No statin  LDL 97  No need to start statin in setting of hemorrhage  Morbid obesity  BMI 53.7  recommend weight loss, diet and exercise as appropriate   Other Stroke Risk Factors  UDS negative   Other Active Problems  Hypokalemia - supplement -> 3.9  leukocystosis - 13.0 -> 10.1  HIV negative  TSH normal  Hospital day # 5   ATTENDING NOTE: Patient was seen and examined by me personally. Documentation reflects findings. The laboratory and radiographic studies reviewed by me. ROS completed by me personally and pertinent positives fully documented  Condition: stable  Assessment and plan completed by me personally and fully documented above. Plans/Recommendations include:     Continue to monitor BPs.  In muche better range now  Orthostatics for tomorrow to follow-up again  Patient was not able to get medication this evening and would need several important doses.  Discharge for tomorrow  Leukocytosis improved will continue to follow  Endocrine referral apparently already made by [facility], for current management of sella mass until patient followed up by neruosurgery as outpatient  To contact Stroke Continuity provider, please refer to [email or web address] After hours, contact General Neurology

## 2016-07-03 NOTE — Discharge Instructions (Signed)
1. Do not stop taking blood pressure medications unless instructed by a physician. 2. See your new primary care physician within 2 to 3 weeks. 3. Follow up with neurosurgeons at [facility] as instructed. 4. Follow-up with the endocrinologist as instructed. 5. Follow-up with ophthalmologist as scheduled.

## 2016-07-04 DIAGNOSIS — E876 Hypokalemia: Secondary | ICD-10-CM | POA: Diagnosis present

## 2016-07-04 DIAGNOSIS — D72829 Elevated white blood cell count, unspecified: Secondary | ICD-10-CM | POA: Diagnosis not present

## 2016-07-04 DIAGNOSIS — E785 Hyperlipidemia, unspecified: Secondary | ICD-10-CM | POA: Diagnosis present

## 2016-07-04 LAB — METANEPHRINES, PLASMA
METANEPHRINE FREE: 32 pg/mL (ref 0–62)
Normetanephrine, Free: 55 pg/mL (ref 0–145)

## 2016-07-04 NOTE — Progress Notes (Signed)
Patient is discharged from room 5M19 at this time. Alert and in stable condition. IV site d/c'd as well as tele. Instructions read to patient with understanding verbalized. Nursing discharge checklist checked and verified by patient. Left unit via wheelchair with wife and all belongings at side.

## 2016-07-04 NOTE — Care Management Note (Signed)
Case Management Note  Patient Details  Name: Andrew Perez MRN: [id number] Date of Birth: 1965/10/19  Subjective/Objective:                    Action/Plan: Pt discharged home with self care. No further needs per CM.   Expected Discharge Date:  07/04/16               Expected Discharge Plan:  Home/Self Care  In-House Referral:     Discharge planning Services  CM Consult  Post Acute Care Choice:    Choice offered to:     DME Arranged:    DME Agency:     HH Arranged:    HH Agency:     Status of Service:  Completed, signed off  If discussed at [employer] of Stay Meetings, dates discussed:    Additional Comments:  Pollie Friar, RN 07/04/2016, 1:54 PM

## 2016-07-05 LAB — CATECHOLAMINES, FRACTIONATED, PLASMA: NOREPINEPHRINE: UNDETERMINED pg/mL

## 2016-07-06 LAB — ALDOSTERONE + RENIN ACTIVITY W/ RATIO
ALDO / PRA Ratio: 10.3 (ref 0.0–30.0)
Aldosterone: 6.2 ng/dL (ref 0.0–30.0)
PRA LC/MS/MS: 0.602 ng/mL/hr (ref 0.167–5.380)

## 2016-07-08 ENCOUNTER — Other Ambulatory Visit: Payer: Self-pay

## 2016-07-08 NOTE — Patient Outreach (Signed)
Andrew Perez [facility]) Care Management  07/08/2016  Andrew Perez 1965/06/25 [id number]     EMMI-Stroke RED ON EMMI ALERT Day # 3 Date: 07/07/16 Red Alert Reason:" Questions/problems with meds? Yes"    Outreach attempt #1 to patient. Spoke with patient. Addressed and reviewed red alert. Patient reports that his spouse has been answering the automated calls for him. He denies any issues affording or obtaining meds. However, patient does report that he has been having some tingling to his right pinky and hand at times. He read up on some of his new meds and their side effects and saw that this was a common side effect. He is not sure if the off and on tingling is from meds or stroke.  He states that he has appt to establish care with PCP at [facility] on 07/12/16. Denies any issues with transportation. States he has supportive spouse who assists with managing his care. Advised patient that he will continue to get automated EMMI Stroke calls over the course of next two weeks and will receive call from a nurse if any of his responses are abnormal. Patient verbalized understanding and was appreciative of call. No further RN CM needs or concerns at this time.      Plan: RN CM will notify [facility] administrative assistant of case status.    Enzo Montgomery, RN,BSN,CCM [facility] Telephonic Care Management Coordinator Direct Phone: [phone] Toll Free: [phone] Fax: [phone]

## 2016-08-11 ENCOUNTER — Ambulatory Visit: Payer: Self-pay | Admitting: Neurology

## 2018-04-13 ENCOUNTER — Encounter (HOSPITAL_COMMUNITY): Payer: Self-pay | Admitting: Emergency Medicine

## 2018-04-13 ENCOUNTER — Emergency Department (HOSPITAL_COMMUNITY)
Admission: EM | Admit: 2018-04-13 | Discharge: 2018-04-13 | Disposition: A | Discharge status: Home / Self Care | Payer: 59 | Attending: Emergency Medicine | Admitting: Emergency Medicine

## 2018-04-13 DIAGNOSIS — S8011XA Contusion of right lower leg, initial encounter: Secondary | ICD-10-CM | POA: Diagnosis not present

## 2018-04-13 DIAGNOSIS — X58XXXA Exposure to other specified factors, initial encounter: Secondary | ICD-10-CM | POA: Diagnosis not present

## 2018-04-13 DIAGNOSIS — I10 Essential (primary) hypertension: Secondary | ICD-10-CM | POA: Insufficient documentation

## 2018-04-13 DIAGNOSIS — Z79899 Other long term (current) drug therapy: Secondary | ICD-10-CM | POA: Diagnosis not present

## 2018-04-13 DIAGNOSIS — Y999 Unspecified external cause status: Secondary | ICD-10-CM | POA: Diagnosis not present

## 2018-04-13 DIAGNOSIS — S8991XA Unspecified injury of right lower leg, initial encounter: Secondary | ICD-10-CM | POA: Diagnosis present

## 2018-04-13 DIAGNOSIS — L03115 Cellulitis of right lower limb: Secondary | ICD-10-CM | POA: Diagnosis not present

## 2018-04-13 DIAGNOSIS — Y929 Unspecified place or not applicable: Secondary | ICD-10-CM | POA: Insufficient documentation

## 2018-04-13 DIAGNOSIS — Y939 Activity, unspecified: Secondary | ICD-10-CM | POA: Insufficient documentation

## 2018-04-13 MED ORDER — CLINDAMYCIN PHOSPHATE 600 MG/50ML IV SOLN
600.0000 mg | Freq: Once | INTRAVENOUS | Status: AC
  Administered 2018-04-13: 600 mg via INTRAVENOUS
  Filled 2018-04-13: qty 50

## 2018-04-13 MED ORDER — CLINDAMYCIN HCL 300 MG PO CAPS: 300.0000 mg | ORAL_CAPSULE | Freq: Three times a day (TID) | ORAL | 0 refills | Status: AC

## 2018-04-13 NOTE — ED Triage Notes (Signed)
Pt reports cellulitis in his right lower leg that began on 12/24. Reports he has had it before in 1997. Swelling in bilateral legs.

## 2018-04-13 NOTE — ED Notes (Signed)
Patient verbalizes understanding of discharge instructions. Opportunity for questioning and answers were provided. Armband removed by staff, pt discharged from ED via wheelchair to home.  

## 2018-04-13 NOTE — Discharge Instructions (Addendum)
I have provided a prescription for antibiotics please take your first dose of antibiotic this afternoon. Please take one tablet three times a day for the next 7 days. If you get fever, chills, worsening swelling or redness please return to the ED.

## 2018-04-13 NOTE — ED Provider Notes (Signed)
[place] EMERGENCY DEPARTMENT Provider Note   CSN: [id number] Arrival date & time: 04/13/18  0900     History   Chief Complaint Chief Complaint  Patient presents with  . Recurrent Skin Infections    HPI Andrew Perez is a 52 y.o. male.  51 y.o male with a PMH of HLD, HTN, Pituitary tumor presents to the ED with chief complaint of skin infection x 3 days. Patient orts a previous history of cellulitis in 1997.  He reports swelling to his right leg along with tenderness to palpation.  He reports taking 5 tablets of for 500 mg amoxicillin but states no improvement in his leg.  Patient reports a blistered/hematoma showed up yesterday which made patient more concerned.  Reports the pain is worse with ambulation and standing.  He reports also applying diaper rash to the area but states no relieve in symptoms.  He denies any calf tenderness, fever, shortness of breath, or chest pain.     Past Medical History:  Diagnosis Date  . Hypertension   . Sleep apnea     Patient Active Problem List   Diagnosis Date Noted  . Hyperlipidemia 07/04/2016  . Hypokalemia 07/04/2016  . Leukocytosis 07/04/2016  . Morbid obesity ([place]) 06/29/2016  . 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 Hypertensive emergency 06/29/2016  . IVH (intraventricular hemorrhage) ([place]) 06/28/2016    Past Surgical History:  Procedure Laterality Date  . TONSILLECTOMY          Home Medications    Prior to Admission medications   Medication Sig Start Date End Date Taking? Authorizing Provider  amLODipine (NORVASC) 10 MG tablet Take 1 tablet (10 mg total) by mouth daily. 07/04/16  Yes Rinehuls, Early Chars, PA-C  cabergoline (DOSTINEX) 0.5 MG tablet Take 0.5 mg by mouth 2 (two) times a week. Friday and Tuesday 06/24/16  Yes [provider]  cloNIDine (CATAPRES - DOSED IN MG/24 HR) 0.3 mg/24hr patch Place 0.3 mg onto the skin every 7 (seven) days. Saturday 03/12/18  Yes [provider]    hydrALAZINE (APRESOLINE) 100 MG tablet Take 1 tablet (100 mg total) by mouth every 8 (eight) hours. Patient taking differently: Take 100 mg by mouth 3 (three) times daily.  07/03/16  Yes Rinehuls, Early Chars, PA-C  levothyroxine (SYNTHROID, LEVOTHROID) 100 MCG tablet Take 100 mcg by mouth daily. 02/22/18  Yes [provider]  losartan (COZAAR) 100 MG tablet Take 100 mg by mouth daily. 03/20/18  Yes [provider]  clindamycin (CLEOCIN) 300 MG capsule Take 1 capsule (300 mg total) by mouth 3 (three) times daily for 7 days. 04/13/18 04/20/18  Janeece Fitting, PA-C  cloNIDine (CATAPRES) 0.1 MG tablet One tablet three times daily Patient not taking: Reported on 04/13/2018 07/03/16   Rinehuls, Early Chars, PA-C  lisinopril (PRINIVIL,ZESTRIL) 20 MG tablet Take 1 tablet (20 mg total) by mouth 2 (two) times daily. Patient not taking: Reported on 04/13/2018 07/03/16   Rinehuls, Early Chars, PA-C    Family History No family history on file.  Social History Social History   Tobacco Use  . Smoking status: Never Smoker  . Smokeless tobacco: Never Used  Substance Use Topics  . Alcohol use: No  . Drug use: No     Allergies   Codeine; Other; and Shellfish allergy   Review of Systems Review of Systems  Constitutional: Negative for fever.  HENT: Negative for sore throat.   Respiratory: Negative for shortness of breath.   Cardiovascular: Positive for leg  swelling. Negative for chest pain.  Genitourinary: Negative for dysuria and flank pain.  Musculoskeletal: Negative for back pain.  Skin: Positive for color change and wound.  Neurological: Negative for light-headedness and headaches.  All other systems reviewed and are negative.    Physical Exam Updated Vital Signs BP (!) 141/66   Pulse (!) 56   Temp 97.8 F (36.6 C) (Oral)   Resp 15   Ht 5\' 11"  (1.803 m)   Wt (!) 188.2 kg   SpO2 99%   BMI 57.88 kg/m   Physical Exam Vitals signs and nursing note reviewed.  Constitutional:       Appearance: He is well-developed.  HENT:     Head: Normocephalic and atraumatic.  Eyes:     General: No scleral icterus.    Pupils: Pupils are equal, round, and reactive to light.  Neck:     Musculoskeletal: Normal range of motion.  Cardiovascular:     Heart sounds: Normal heart sounds.  Pulmonary:     Effort: Pulmonary effort is normal.     Breath sounds: Normal breath sounds. No wheezing.  Chest:     Chest wall: No tenderness.  Abdominal:     General: Bowel sounds are normal. There is no distension.     Palpations: Abdomen is soft.     Tenderness: There is no abdominal tenderness.  Musculoskeletal:        General: No tenderness or deformity.     Right knee: He exhibits swelling and erythema. He exhibits normal range of motion, no deformity and no laceration.       Legs:     Comments: Please see photo attached.   Skin:    General: Skin is warm and dry.  Neurological:     Mental Status: He is alert and oriented to person, place, and time.          ED Treatments / Results  Labs (all labs ordered are listed, but only abnormal results are displayed) Labs Reviewed - No data to display  EKG None  Radiology No results found.  Procedures Procedures (including critical care time)  Medications Ordered in ED Medications  clindamycin (CLEOCIN) IVPB 600 mg (600 mg Intravenous New Bag/Given 04/13/18 1103)     Initial Impression / Assessment and Plan / ED Course  I have reviewed the triage vital signs and the nursing notes.  Pertinent labs & imaging results that were available during my care of the patient were reviewed by me and considered in my medical decision making (see chart for details).     Presents from home with right lower leg skin infection which began this evening.  He reports the pain and swelling has gotten worse as he stands and ambulates.  Tried over-the-counter measures no relief in symptoms.  Patient also reports a hematoma has formed in the  past day due to the fact that he wears compression socks and it is right around the rim of the sock, will evacuate this hematoma at this time.  Suspicion for cellulitis, he does have a previous history of cellulitis for in 1997.  He denies any shortness of breath or calf tenderness at this time low suspicion for DVT.   I have attempted a hematoma drainage, 1 cc of purulent fluid from patient wound which was actively draining. Will provide him with IV to biotics, along with a prescription for clindamycin to take at home.  He is advised to return if he experiences any fever, worsening redness, worsening swelling.  Strict return precautions provided to patient and wife.  Vitals stable for discharge.  Final Clinical Impressions(s) / ED Diagnoses   Final diagnoses:  Cellulitis of right lower extremity    ED Discharge Orders         Ordered    clindamycin (CLEOCIN) 300 MG capsule  3 times daily     04/13/18 1022           Janeece Fitting, PA-C 04/13/18 1125    Mesner, Corene Cornea, MD 04/13/18 1527

## 2019-09-17 ENCOUNTER — Encounter (HOSPITAL_COMMUNITY): Payer: Self-pay | Admitting: Emergency Medicine

## 2019-09-17 ENCOUNTER — Other Ambulatory Visit: Payer: Self-pay

## 2019-09-17 ENCOUNTER — Inpatient Hospital Stay (HOSPITAL_COMMUNITY)
Admission: EM | Admit: 2019-09-17 | Discharge: 2019-09-27 | DRG: 021 | Disposition: A | Discharge status: Rehabilitation Facility | Payer: 59 | Attending: Neurology | Admitting: Neurology

## 2019-09-17 ENCOUNTER — Emergency Department (HOSPITAL_COMMUNITY): Payer: 59

## 2019-09-17 DIAGNOSIS — Z79899 Other long term (current) drug therapy: Secondary | ICD-10-CM

## 2019-09-17 DIAGNOSIS — Z885 Allergy status to narcotic agent status: Secondary | ICD-10-CM | POA: Diagnosis not present

## 2019-09-17 DIAGNOSIS — I671 Cerebral aneurysm, nonruptured: Secondary | ICD-10-CM | POA: Diagnosis not present

## 2019-09-17 DIAGNOSIS — I82442 Acute embolism and thrombosis of left tibial vein: Secondary | ICD-10-CM

## 2019-09-17 DIAGNOSIS — G4733 Obstructive sleep apnea (adult) (pediatric): Secondary | ICD-10-CM | POA: Diagnosis present

## 2019-09-17 DIAGNOSIS — D62 Acute posthemorrhagic anemia: Secondary | ICD-10-CM | POA: Diagnosis not present

## 2019-09-17 DIAGNOSIS — I639 Cerebral infarction, unspecified: Secondary | ICD-10-CM | POA: Diagnosis not present

## 2019-09-17 DIAGNOSIS — Z79890 Hormone replacement therapy (postmenopausal): Secondary | ICD-10-CM | POA: Diagnosis not present

## 2019-09-17 DIAGNOSIS — E236 Other disorders of pituitary gland: Secondary | ICD-10-CM

## 2019-09-17 DIAGNOSIS — I82409 Acute embolism and thrombosis of unspecified deep veins of unspecified lower extremity: Secondary | ICD-10-CM | POA: Diagnosis not present

## 2019-09-17 DIAGNOSIS — R29810 Facial weakness: Secondary | ICD-10-CM | POA: Diagnosis present

## 2019-09-17 DIAGNOSIS — R29700 NIHSS score 0: Secondary | ICD-10-CM | POA: Diagnosis present

## 2019-09-17 DIAGNOSIS — I161 Hypertensive emergency: Secondary | ICD-10-CM | POA: Diagnosis present

## 2019-09-17 DIAGNOSIS — E039 Hypothyroidism, unspecified: Secondary | ICD-10-CM | POA: Diagnosis present

## 2019-09-17 DIAGNOSIS — Z8673 Personal history of transient ischemic attack (TIA), and cerebral infarction without residual deficits: Secondary | ICD-10-CM

## 2019-09-17 DIAGNOSIS — I63543 Cerebral infarction due to unspecified occlusion or stenosis of bilateral cerebellar arteries: Secondary | ICD-10-CM | POA: Diagnosis not present

## 2019-09-17 DIAGNOSIS — Z7982 Long term (current) use of aspirin: Secondary | ICD-10-CM

## 2019-09-17 DIAGNOSIS — Z87892 Personal history of anaphylaxis: Secondary | ICD-10-CM

## 2019-09-17 DIAGNOSIS — I615 Nontraumatic intracerebral hemorrhage, intraventricular: Secondary | ICD-10-CM | POA: Diagnosis present

## 2019-09-17 DIAGNOSIS — Z6841 Body Mass Index (BMI) 40.0 and over, adult: Secondary | ICD-10-CM | POA: Diagnosis not present

## 2019-09-17 DIAGNOSIS — Z20822 Contact with and (suspected) exposure to covid-19: Secondary | ICD-10-CM | POA: Diagnosis present

## 2019-09-17 DIAGNOSIS — D72829 Elevated white blood cell count, unspecified: Secondary | ICD-10-CM | POA: Diagnosis present

## 2019-09-17 DIAGNOSIS — E785 Hyperlipidemia, unspecified: Secondary | ICD-10-CM | POA: Diagnosis present

## 2019-09-17 DIAGNOSIS — R2681 Unsteadiness on feet: Secondary | ICD-10-CM | POA: Diagnosis present

## 2019-09-17 DIAGNOSIS — E876 Hypokalemia: Secondary | ICD-10-CM | POA: Diagnosis present

## 2019-09-17 DIAGNOSIS — I10 Essential (primary) hypertension: Secondary | ICD-10-CM | POA: Diagnosis present

## 2019-09-17 DIAGNOSIS — H5500 Unspecified nystagmus: Secondary | ICD-10-CM | POA: Diagnosis present

## 2019-09-17 DIAGNOSIS — G8194 Hemiplegia, unspecified affecting left nondominant side: Secondary | ICD-10-CM | POA: Diagnosis present

## 2019-09-17 DIAGNOSIS — Z91013 Allergy to seafood: Secondary | ICD-10-CM

## 2019-09-17 DIAGNOSIS — R7301 Impaired fasting glucose: Secondary | ICD-10-CM | POA: Diagnosis present

## 2019-09-17 DIAGNOSIS — E871 Hypo-osmolality and hyponatremia: Secondary | ICD-10-CM | POA: Diagnosis not present

## 2019-09-17 DIAGNOSIS — I69154 Hemiplegia and hemiparesis following nontraumatic intracerebral hemorrhage affecting left non-dominant side: Secondary | ICD-10-CM | POA: Diagnosis not present

## 2019-09-17 DIAGNOSIS — D352 Benign neoplasm of pituitary gland: Secondary | ICD-10-CM | POA: Diagnosis present

## 2019-09-17 DIAGNOSIS — E7800 Pure hypercholesterolemia, unspecified: Secondary | ICD-10-CM | POA: Diagnosis not present

## 2019-09-17 HISTORY — DX: Benign neoplasm of pituitary gland: D35.2

## 2019-09-17 LAB — RAPID URINE DRUG SCREEN, HOSP PERFORMED
Amphetamines: NOT DETECTED
Barbiturates: NOT DETECTED
Benzodiazepines: NOT DETECTED
Cocaine: NOT DETECTED
Opiates: POSITIVE — AB
Tetrahydrocannabinol: NOT DETECTED

## 2019-09-17 LAB — CBC
HCT: 40 % (ref 39.0–52.0)
Hemoglobin: 13.2 g/dL (ref 13.0–17.0)
MCH: 29.3 pg (ref 26.0–34.0)
MCHC: 33 g/dL (ref 30.0–36.0)
MCV: 88.9 fL (ref 80.0–100.0)
Platelets: 220 10*3/uL (ref 150–400)
RBC: 4.5 MIL/uL (ref 4.22–5.81)
RDW: 14.5 % (ref 11.5–15.5)
WBC: 6.6 10*3/uL (ref 4.0–10.5)
nRBC: 0 % (ref 0.0–0.2)

## 2019-09-17 LAB — BASIC METABOLIC PANEL
Anion gap: 11 (ref 5–15)
BUN: 12 mg/dL (ref 6–20)
CO2: 27 mmol/L (ref 22–32)
Calcium: 9.5 mg/dL (ref 8.9–10.3)
Chloride: 103 mmol/L (ref 98–111)
Creatinine, Ser: 0.94 mg/dL (ref 0.61–1.24)
GFR calc Af Amer: >60 mL/min (ref >60)
GFR calc non Af Amer: >60 mL/min (ref >60)
Glucose, Bld: 116 mg/dL — ABNORMAL HIGH (ref 70–99)
Potassium: 4 mmol/L (ref 3.5–5.1)
Sodium: 141 mmol/L (ref 135–145)

## 2019-09-17 LAB — SARS CORONAVIRUS 2 BY RT PCR (HOSPITAL ORDER, PERFORMED IN ~~LOC~~ HOSPITAL LAB): SARS Coronavirus 2: NEGATIVE

## 2019-09-17 MED ORDER — CLEVIDIPINE BUTYRATE 0.5 MG/ML IV EMUL
0.0000 mg/h | INTRAVENOUS | Status: DC
  Administered 2019-09-17: 1 mg/h via INTRAVENOUS
  Administered 2019-09-18 (×2): 16 mg/h via INTRAVENOUS
  Administered 2019-09-19: 1 mg/h via INTRAVENOUS
  Filled 2019-09-17 (×6): qty 50

## 2019-09-17 MED ORDER — PANTOPRAZOLE SODIUM 40 MG IV SOLR: 40.0000 mg | Freq: Every day | INTRAVENOUS | Status: DC

## 2019-09-17 MED ORDER — ONDANSETRON HCL 4 MG/2ML IJ SOLN
4.0000 mg | Freq: Once | INTRAMUSCULAR | Status: AC
  Administered 2019-09-17: 4 mg via INTRAVENOUS
  Filled 2019-09-17: qty 2

## 2019-09-17 MED ORDER — LABETALOL HCL 5 MG/ML IV SOLN
20.0000 mg | Freq: Once | INTRAVENOUS | Status: AC
  Administered 2019-09-17: 20 mg via INTRAVENOUS
  Filled 2019-09-17: qty 4

## 2019-09-17 MED ORDER — ONDANSETRON HCL 4 MG/2ML IJ SOLN
4.0000 mg | Freq: Four times a day (QID) | INTRAMUSCULAR | Status: DC | PRN
  Administered 2019-09-20: 4 mg via INTRAVENOUS
  Filled 2019-09-17 (×2): qty 2

## 2019-09-17 MED ORDER — ACETAMINOPHEN 325 MG PO TABS
650.0000 mg | ORAL_TABLET | ORAL | Status: DC | PRN
  Administered 2019-09-20 – 2019-09-21 (×5): 650 mg via ORAL
  Filled 2019-09-17 (×5): qty 2

## 2019-09-17 MED ORDER — CABERGOLINE 0.5 MG PO TABS
0.5000 mg | ORAL_TABLET | ORAL | Status: DC
  Administered 2019-09-18: 0.5 mg via ORAL

## 2019-09-17 MED ORDER — CLEVIDIPINE BUTYRATE 0.5 MG/ML IV EMUL: 0.0000 mg/h | INTRAVENOUS | Status: DC

## 2019-09-17 MED ORDER — SENNOSIDES-DOCUSATE SODIUM 8.6-50 MG PO TABS
1.0000 | ORAL_TABLET | Freq: Two times a day (BID) | ORAL | Status: DC
  Administered 2019-09-18 – 2019-09-25 (×13): 1 via ORAL
  Filled 2019-09-17 (×14): qty 1

## 2019-09-17 MED ORDER — MORPHINE SULFATE (PF) 4 MG/ML IV SOLN: 4.0000 mg | Freq: Once | INTRAVENOUS | Status: DC

## 2019-09-17 MED ORDER — LEVOTHYROXINE SODIUM 100 MCG PO TABS
100.0000 ug | ORAL_TABLET | Freq: Every day | ORAL | Status: DC
  Administered 2019-09-18 – 2019-09-27 (×9): 100 ug via ORAL
  Filled 2019-09-17 (×10): qty 1

## 2019-09-17 MED ORDER — SODIUM CHLORIDE 0.9 % IV BOLUS
1000.0000 mL | Freq: Once | INTRAVENOUS | Status: AC
  Administered 2019-09-17: 1000 mL via INTRAVENOUS

## 2019-09-17 MED ORDER — SODIUM CHLORIDE 0.9% FLUSH
3.0000 mL | Freq: Once | INTRAVENOUS | Status: AC
  Administered 2019-09-17: 3 mL via INTRAVENOUS

## 2019-09-17 MED ORDER — ACETAMINOPHEN 160 MG/5ML PO SOLN: 650.0000 mg | ORAL | Status: DC | PRN

## 2019-09-17 MED ORDER — ACETAMINOPHEN 650 MG RE SUPP: 650.0000 mg | RECTAL | Status: DC | PRN

## 2019-09-17 MED ORDER — MORPHINE SULFATE (PF) 4 MG/ML IV SOLN
4.0000 mg | Freq: Once | INTRAVENOUS | Status: AC
  Administered 2019-09-17: 4 mg via INTRAVENOUS
  Filled 2019-09-17: qty 1

## 2019-09-17 MED ORDER — STROKE: EARLY STAGES OF RECOVERY BOOK
Freq: Once | Status: AC
  Filled 2019-09-17 (×2): qty 1

## 2019-09-17 NOTE — ED Triage Notes (Signed)
Patient arrives to ED with PTAR complaints of a posterior headache starting around 2pm today. Patient states he has a hx of a pituitary tumor and intraventricular hemorrhage. No vision, weakness, or speech abnormalities. Patient stats he became nauseous soon after the headache started.

## 2019-09-17 NOTE — Consult Note (Signed)
Reason for Consult: IVH Referring Physician: edp  Andrew Perez is an 54 y.o. male.   HPI:  54 year old male presented to the ED tonight after having persistent nausea vomiting and headaches. He did report some dizziness as well. All of this started after he bent over to flush the toilet. He does report a mild headache right now but still has nausea and vomiting. Denies any visual changes or double vision. He has a known pituitary tumor that is being followed by a neurosurgeon at [facility]. He states that he did not have an MRI this year but is scheduled to have one next year. He was admitted in 2018 with this same IVH in his fourth ventricle by neurology. Does have a history of hypertension which is being treated for.   Past Medical History:  Diagnosis Date   Hypertension    Sleep apnea     Past Surgical History:  Procedure Laterality Date   TONSILLECTOMY      Allergies  Allergen Reactions   Codeine Shortness Of Breath   Other Anaphylaxis   Shellfish Allergy Shortness Of Breath    Social History   Tobacco Use   Smoking status: Never Smoker   Smokeless tobacco: Never Used  Substance Use Topics   Alcohol use: No    History reviewed. No pertinent family history.   Review of Systems  Positive ROS: na  All other systems have been reviewed and were otherwise negative with the exception of those mentioned in the HPI and as above.  Objective: Vital signs in last 24 hours: Temp:  [97.7 F (36.5 C)] 97.7 F (36.5 C) (06/01 1607) Pulse Rate:  [57-64] 57 (06/01 2030) Resp:  [18-24] 18 (06/01 2030) BP: (155-183)/(80-91) 174/91 (06/01 2030) SpO2:  [94 %-100 %] 95 % (06/01 2030) Weight:  [199.6 kg] 199.6 kg (06/01 1607)  General Appearance: Alert, cooperative, no distress, appears stated age Head: Normocephalic, without obvious abnormality, atraumatic Eyes: PERRL, conjunctiva/corneas clear, EOM's intact, fundi benign, both eyes      Lungs:  respirations  unlabored Heart: Regular rate and rhythm Extremities: Extremities normal, atraumatic, no cyanosis or edema Pulses: 2+ and symmetric all extremities Skin: Skin color, texture, turgor normal, no rashes or lesions  NEUROLOGIC:   Mental status: A&O x4, no aphasia, good attention span, Memory and fund of knowledge Motor Exam - grossly normal, normal tone and bulk Sensory Exam - grossly normal Reflexes: symmetric, no pathologic reflexes, No Hoffman's, No clonus Coordination - grossly normal Gait - not tested Balance - not tested Cranial Nerves: I: smell Not tested  II: visual acuity  OS: an    OD: na  II: visual fields Full to confrontation  II: pupils Equal, round, reactive to light  III,VII: ptosis None  III,IV,VI: extraocular muscles  Full ROM  V: mastication Normal  V: facial light touch sensation  Normal  V,VII: corneal reflex  Present  VII: facial muscle function - upper  Normal  VII: facial muscle function - lower Normal  VIII: hearing Not tested  IX: soft palate elevation  Normal  IX,X: gag reflex Present  XI: trapezius strength  5/5  XI: sternocleidomastoid strength 5/5  XI: neck flexion strength  5/5  XII: tongue strength  Normal    Data Review Lab Results  Component Value Date   WBC 6.6 09/17/2019   HGB 13.2 09/17/2019   HCT 40.0 09/17/2019   MCV 88.9 09/17/2019   PLT 220 09/17/2019   Lab Results  Component  Value Date   NA 141 09/17/2019   K 4.0 09/17/2019   CL 103 09/17/2019   CO2 27 09/17/2019   BUN 12 09/17/2019   CREATININE 0.94 09/17/2019   GLUCOSE 116 (H) 09/17/2019   No results found for: INR, PROTIME  Radiology: CT HEAD WO CONTRAST  Result Date: 09/17/2019 CLINICAL DATA:  Headache. 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 EXAM: CT HEAD WITHOUT CONTRAST TECHNIQUE: Contiguous axial images were obtained from the base of the skull through the vertex without intravenous contrast. COMPARISON:  Brain MR 06/28/2016.  CT 06/28/2016.  FINDINGS: Brain: Previously identified infiltrative mass lesion at the central skull base appears to have at least been partially resected since previous. Evaluation for residual tumor limited on this noncontrast CT examination. Small amount of hyperdensity seen within the fourth ventricle, suspicious for acute intraventricular hemorrhage (series 3, image 11). This measures up to 1 cm in maximal diameter. Overall ventricular size and morphology is fairly stable without obstructive hydrocephalus or trapping. No other acute intracranial hemorrhage. No acute large vessel territory infarct. No other mass lesion, mass effect, or midline shift. No extra-axial fluid collection. Vascular: No hyperdense vessel. Scattered vascular calcifications noted within the carotid siphons. Skull: Scalp soft tissues demonstrate no acute abnormality. Calvarium intact. Sinuses/Orbits: Globes and orbital soft tissues within normal limits. Small left maxillary sinus retention cyst noted. Paranasal sinuses are otherwise largely clear. Chronic right mastoid and middle ear effusion noted. IMPRESSION: 1. Small volume acute intraventricular hemorrhage involving the fourth ventricle. No evidence for obstructive hydrocephalus. 2. Suspected at least partial interval resection of previously identified infiltrative mass lesion at the central skull base. Evaluation for residual tumor limited on this noncontrast CT examination. 3. No other acute intracranial abnormality. 4. Chronic right mastoid and middle ear effusion. Critical Value/emergent results were called by telephone at the time of interpretation on 09/17/2019 at 7:38 pm to provider [facility] , who verbally acknowledged these results. Electronically Signed   By: Jeannine Boga M.D.   On: 09/17/2019 19:39    Assessment/Plan: 54 year old male presented to the ED tonight after persistent nausea vomiting and headaches.  CT head revealed a small acute intraventricular hemorrhage into his  fourth ventricle with no obstructive hydrocephalus.  He does have a known pituitary tumor which he sees neurosurgery for at [facility].  CT head in 2018 showed a very similar hemorrhage.  His CTA in 2018 was negative.  He was admitted by neurology and told to follow-up with his neurosurgeon.  I do not see any surgical intervention needed at this time.  Would recommend neurology admit for further work-up and follow-up with neurosurgery as an outpatient for treatment of his pituitary tumor.  Blood pressure is elevated during exam so would recommend systolic blood pressure goal less than 140.    Ocie Cornfield Abel Hageman 09/17/2019 9:21 PM

## 2019-09-17 NOTE — H&P (Signed)
H&P - IVH  CC: headache  History is obtained from: Patient, chart review  HPI: Andrew Perez is a 54 y.o. 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  He says he was fine until 2 PM today when he suddenly had an acute onset of posterior headache.  He felt dizzy and had an episode of vomiting.  He was very nauseous and remains nauseous now.  Wife reports that he been feeling slightly off-she describes them as feeling a little withdrawn since yesterday but he did not start having any headaches since this afternoon.  Noncontrast head CT was done in the emergency room that revealed a small intraventricular hemorrhage in the fourth ventricle without hydrocephalus or any other acute abnormality. Neurology was consulted for admission for after the case was discussed with neurosurgery-no emergent neurosurgical intervention recommended. He was hypertensive with systolic blood pressure as high as 190 noted on this admission. Reports good control of blood pressure with usually systolics staying between 1 20-1 30 and regular follow-up with his neurosurgeon and endocrinologist for his prolactinoma.  LKW: 2 PM today 09/17/2019 tpa given?: no, ICH Premorbid modified Rankin scale (mRS): *1  ROS: Performed with pertinent positives noted in the HPI, rest of the review negative.  Past Medical History:  Diagnosis Date  . Hypertension   . Sleep apnea     History reviewed. No pertinent family history. No family history of stroke  Social History:   reports that he has never smoked. He has never used smokeless tobacco. He reports that he does  not drink alcohol or use drugs.  Medications  Current Facility-Administered Medications:  .   stroke: mapping our early stages of recovery book, , Does not apply, Once, Amie Portland, MD .  acetaminophen (TYLENOL) tablet 650 mg, 650 mg, Oral, Q4H PRN **OR** acetaminophen (TYLENOL) 160 MG/5ML solution 650 mg, 650 mg, Per Tube, Q4H PRN **OR** acetaminophen (TYLENOL) suppository 650 mg, 650 mg, Rectal, Q4H PRN, Amie Portland, MD .  clevidipine (CLEVIPREX) infusion 0.5 mg/mL, 0-21 mg/hr, Intravenous, Continuous, Lajean Saver, MD, Last Rate: 2 mL/hr at 09/17/19 2125, 1 mg/hr at 09/17/19 2125 .  labetalol (NORMODYNE) injection 20 mg, 20 mg, Intravenous, Once **AND** clevidipine (CLEVIPREX) infusion 0.5 mg/mL, 0-21 mg/hr, Intravenous, Continuous, Amie Portland, MD .  morphine 4 MG/ML injection 4 mg, 4 mg, Intravenous, Once, Lajean Saver, MD .  ondansetron [facility]) injection 4 mg, 4 mg, Intravenous, Once, Lajean Saver, MD .  ondansetron [facility]) injection 4 mg, 4 mg, Intravenous, Q6H PRN, Amie Portland, MD .  pantoprazole (PROTONIX) injection 40 mg, 40 mg, Intravenous, QHS, Amie Portland, MD .  senna-docusate (Senokot-S) tablet 1 tablet, 1 tablet, Oral, BID, Amie Portland, MD .  sodium chloride flush (NS) 0.9 % injection 3 mL, 3 mL, Intravenous, Once, Lajean Saver, MD  Current Outpatient Medications:  .  amLODipine (NORVASC) 10 MG tablet, Take 1 tablet (10 mg total) by mouth daily., Disp: 30 tablet, Rfl: 6 .  cabergoline (DOSTINEX) 0.5 MG tablet, Take 0.5 mg by mouth 2 (two) times a week. Friday and Tuesday, Disp: , Rfl: 0 .  cloNIDine (CATAPRES - DOSED IN MG/24 HR) 0.3 mg/24hr patch, Place 0.3 mg onto the skin every 7 (  seven) days. Saturday, Disp: , Rfl:  .  cloNIDine (CATAPRES) 0.1 MG tablet, One tablet three times daily (Patient not taking: Reported on 04/13/2018), Disp: 90 tablet, Rfl: 6 .  hydrALAZINE (APRESOLINE) 100 MG tablet, Take 1 tablet (100 mg total) by mouth every 8 (eight) hours.  (Patient taking differently: Take 100 mg by mouth 3 (three) times daily. ), Disp: 90 tablet, Rfl: 6 .  levothyroxine (SYNTHROID, LEVOTHROID) 100 MCG tablet, Take 100 mcg by mouth daily., Disp: , Rfl:  .  lisinopril (PRINIVIL,ZESTRIL) 20 MG tablet, Take 1 tablet (20 mg total) by mouth 2 (two) times daily. (Patient not taking: Reported on 04/13/2018), Disp: 60 tablet, Rfl: 6 .  losartan (COZAAR) 100 MG tablet, Take 100 mg by mouth daily., Disp: , Rfl:   Exam: Current vital signs: BP (!) 174/91   Pulse (!) 57   Temp 97.7 F (36.5 C) (Oral)   Resp 18   Ht 5\' 11"  (1.803 m)   Wt (!) 199.6 kg   SpO2 95%   BMI 61.37 kg/m  Vital signs in last 24 hours: Temp:  [97.7 F (36.5 C)] 97.7 F (36.5 C) (06/01 1607) Pulse Rate:  [57-64] 57 (06/01 2030) Resp:  [18-24] 18 (06/01 2030) BP: (155-183)/(80-91) 174/91 (06/01 2030) SpO2:  [94 %-100 %] 95 % (06/01 2030) Weight:  [199.6 kg] 199.6 kg (06/01 1607) General: Awake alert in no distress HEENT: Normocephalic atraumatic Lungs clear Cardiovascular: Regular rate rhythm Abdomen soft nondistended nontender Extremities obese, pitting edema bilaterally Neurological exam Awake alert oriented x3 No aphasia No dysarthria Cranial nerves: Pupils equal round react light, extraocular movements appear intact without any restriction, visual fields full, face symmetric, tongue and palate midline. Motor exam: Upper extremities 5/5 without any drift, 4+/5 bilateral lower extremities without any drift-exam somewhat limited by body habitus. Sensory exam: Intact light touch without extinction Coordination: No finger-nose-finger dysmetria noted. NIH stroke scale-0  Labs I have reviewed labs in epic and the results pertinent to this consultation are:  CBC    Component Value Date/Time   WBC 6.6 09/17/2019 1619   RBC 4.50 09/17/2019 1619   HGB 13.2 09/17/2019 1619   HCT 40.0 09/17/2019 1619   PLT 220 09/17/2019 1619   MCV 88.9 09/17/2019 1619   MCH 29.3  09/17/2019 1619   MCHC 33.0 09/17/2019 1619   RDW 14.5 09/17/2019 1619    CMP     Component Value Date/Time   NA 141 09/17/2019 1619   K 4.0 09/17/2019 1619   CL 103 09/17/2019 1619   CO2 27 09/17/2019 1619   GLUCOSE 116 (H) 09/17/2019 1619   BUN 12 09/17/2019 1619   CREATININE 0.94 09/17/2019 1619   CALCIUM 9.5 09/17/2019 1619   PROT 6.0 (L) 07/03/2016 0430   ALBUMIN 3.3 (L) 07/03/2016 0430   AST 11 (L) 07/03/2016 0430   ALT 16 (L) 07/03/2016 0430   ALKPHOS 59 07/03/2016 0430   BILITOT 1.0 07/03/2016 0430   GFRNONAA >60 09/17/2019 1619   GFRAA >60 09/17/2019 1619   Imaging I have reviewed the images obtained:  CT-scan of the brain small acute IVH in the fourth ventricle.  No hydrocephalus.  Decreased size of infiltrative mass seen at central skull base on prior CT.  Chronic right mastoid middle ear effusion.  No other acute intracranial abnormality.  Assessment:  54 year old with above past medical history presenting with sudden onset of posterior headache. Noted to have a small volume acute intraventricular hemorrhage in the fourth ventricle. Has a history  of a similar IVH in 2018.  Vessel imaging at that point was unremarkable. Remains hypertensive-systolic blood pressure high as 190. Likely hypertensive bleed but vascular cause needs to be evaluated due to the multiplicity of the event.  Plan: IVH, nontraumatic  Acuity: Acute Current suspected etiology: HTN  Treatment: -Admit to NICU -ICH Score: 1 -ICH Volume:NA -BP control goal SYS<140 -Appreciate neurosurgical consultation with recommendations of continuing outpatient neurosurgeon at [facility] -PT/OT/ST  -neuromonitoring  CNS -Close neuro monitoring -Repeat CT head tomorrow morning -Discussed with neuro interventional radiology Dr. Katherina Right Rodriguez-I have put in a consult for diagnosable angiogram to look for any vascular malformation. -NPO  RESP No active issues Monitor clinically  CV Hypertensive  emergency -SBP goal as above -Use labetalol/hydralazine as needed and Cleviprex drip. -2D echo  GI/GU No active issues Check labs in the morning  HEME Not on anticoagulants or antiplatelets. No evidence of coagulopathy Check PT/INR  ENDO Prolactinoma-being treated by awake Continue cabergoline 0.5mg  twice weekly (Sat/Thu) Hypothyroid: c/w home levothyroxine dose  Fluid/Electrolyte Disorders No acute issues Check BMP in the morning  ID No active issues Monitor vitals and CBC  Nutrition E66.9 Obesity  -diet consult  Prophylaxis DVT: SCD-do not use antiplatelets or anticoagulants at this time due to IVH. GI: Protonix Bowel: Docusate senna  Dispo: To be determined  Diet: NPO for possible diagnostic cerebral angiogram tomorrow-Dr. Norma Fredrickson aware.  Code Status: Full Code   Plan discussed with Dr. Ashok Cordia in the ER  THE FOLLOWING WERE PRESENT ON ADMISSION: Intraventricular hemorrhage, hypertensive emergency, prolactinoma  -- Amie Portland, MD [facility] Pager: [phone] If 7pm to 7am, please call on call as listed on AMION.   CRITICAL CARE ATTESTATION Performed by: Amie Portland, MD Total critical care time: 50 minutes Critical care time was exclusive of separately billable procedures and treating other patients and/or supervising APPs/Residents/Students Critical care was necessary to treat or prevent imminent or life-threatening deterioration due to IVH, HTN emergency  This patient is critically ill and at significant risk for neurological worsening and/or death and care requires constant monitoring. Critical care was time spent personally by me on the following activities: development of treatment plan with patient and/or surrogate as well as nursing, discussions with consultants, evaluation of patient's response to treatment, examination of patient, obtaining history from patient or surrogate, ordering and performing treatments and interventions,  ordering and review of laboratory studies, ordering and review of radiographic studies, pulse oximetry, re-evaluation of patient's condition, participation in multidisciplinary rounds and medical decision making of high complexity in the care of this patient.

## 2019-09-17 NOTE — ED Provider Notes (Signed)
[facility] EMERGENCY DEPARTMENT Provider Note   CSN: [id number] Arrival date & time: 09/17/19  1603     History Chief Complaint  Patient presents with  . Headache  . Nausea    Andrew Perez is a 54 y.o. male.  Patient with hx pituitary/sella mass, presents with acute onset nausea/vomiting a few hours ago. Patient indicates has used bathroom, bent over to flush, stool upright, felt faint/dizzy, then warm/flushed, then became nauseated, and had episode emesis. Patient then notes onset dull headache, mainly posteriorly. No bloody or bilious emesis. No abd pain. Having normal bms. No abd distension. No diarrhea or constipation. No rectal bleeding or melena. No dysuria or gu c/o. Denies known bad food ingestion or ill contacts. Patient denies eye pain or change in vision. No neck pain or stiffness. No change in speech. No numbness, weakness or problems w balance or coordatination.   The history is provided by the patient and the EMS personnel.  Headache Associated symptoms: dizziness, nausea and vomiting   Associated symptoms: no abdominal pain, no back pain, no cough, no eye pain, no fever, no neck pain, no neck stiffness, no numbness, no sore throat and no weakness        Past Medical History:  Diagnosis Date  . Hypertension   . Sleep apnea     Patient Active Problem List   Diagnosis Date Noted  . Hyperlipidemia 07/04/2016  . Hypokalemia 07/04/2016  . Leukocytosis 07/04/2016  . Morbid obesity ([place]) 06/29/2016  . 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 Hypertensive emergency 06/29/2016  . IVH (intraventricular hemorrhage) ([place]) 06/28/2016    Past Surgical History:  Procedure Laterality Date  . TONSILLECTOMY         History reviewed. No pertinent family history.  Social History   Tobacco Use  . Smoking status: Never Smoker  . Smokeless tobacco: Never Used  Substance Use Topics  . Alcohol use: No  . Drug use: No    Home Medications Prior to Admission  medications   Medication Sig Start Date End Date Taking? Authorizing Provider  amLODipine (NORVASC) 10 MG tablet Take 1 tablet (10 mg total) by mouth daily. 07/04/16   Rinehuls, Early Chars, PA-C  cabergoline (DOSTINEX) 0.5 MG tablet Take 0.5 mg by mouth 2 (two) times a week. Friday and Tuesday 06/24/16   [provider]  cloNIDine (CATAPRES - DOSED IN MG/24 HR) 0.3 mg/24hr patch Place 0.3 mg onto the skin every 7 (seven) days. Saturday 03/12/18   [provider]  cloNIDine (CATAPRES) 0.1 MG tablet One tablet three times daily Patient not taking: Reported on 04/13/2018 07/03/16   Rinehuls, Early Chars, PA-C  hydrALAZINE (APRESOLINE) 100 MG tablet Take 1 tablet (100 mg total) by mouth every 8 (eight) hours. Patient taking differently: Take 100 mg by mouth 3 (three) times daily.  07/03/16   Rinehuls, Early Chars, PA-C  levothyroxine (SYNTHROID, LEVOTHROID) 100 MCG tablet Take 100 mcg by mouth daily. 02/22/18   [provider]  lisinopril (PRINIVIL,ZESTRIL) 20 MG tablet Take 1 tablet (20 mg total) by mouth 2 (two) times daily. Patient not taking: Reported on 04/13/2018 07/03/16   Rinehuls, Early Chars, PA-C  losartan (COZAAR) 100 MG tablet Take 100 mg by mouth daily. 03/20/18   [provider]    Allergies    Codeine, Other, and Shellfish allergy  Review of Systems   Review of Systems  Constitutional: Negative for chills and fever.  HENT: Negative for sinus pain and sore throat.  Eyes: Negative for pain, redness and visual disturbance.  Respiratory: Negative for cough and shortness of breath.   Cardiovascular: Negative for chest pain.  Gastrointestinal: Positive for nausea and vomiting. Negative for abdominal pain.  Endocrine: Negative for polyuria.  Genitourinary: Negative for flank pain.  Musculoskeletal: Negative for back pain, neck pain and neck stiffness.  Skin: Negative for rash.  Neurological: Positive for dizziness, light-headedness and headaches. Negative for speech  difficulty, weakness and numbness.  Hematological: Does not bruise/bleed easily.  Psychiatric/Behavioral: Negative for confusion.    Physical Exam Updated Vital Signs BP (!) 162/84 (BP Location: Right Arm)   Pulse 61   Temp 97.7 F (36.5 C) (Oral)   Resp (!) 24   Ht 1.803 m (5\' 11" )   Wt (!) 199.6 kg   SpO2 100%   BMI 61.37 kg/m   Physical Exam Vitals and nursing note reviewed.  Constitutional:      Appearance: Normal appearance. He is well-developed.  HENT:     Head: Atraumatic.     Comments: No sinus or temporal tenderness.     Right Ear: Tympanic membrane normal.     Left Ear: Tympanic membrane normal.     Nose: Nose normal.     Mouth/Throat:     Mouth: Mucous membranes are moist.     Pharynx: Oropharynx is clear.  Eyes:     General: No scleral icterus.    Conjunctiva/sclera: Conjunctivae normal.     Pupils: Pupils are equal, round, and reactive to light.  Neck:     Vascular: No carotid bruit.     Trachea: No tracheal deviation.     Comments: No stiffness or rigidity.  Cardiovascular:     Rate and Rhythm: Normal rate and regular rhythm.     Pulses: Normal pulses.     Heart sounds: Normal heart sounds. No murmur. No friction rub. No gallop.   Pulmonary:     Effort: Pulmonary effort is normal. No accessory muscle usage or respiratory distress.     Breath sounds: Normal breath sounds.  Abdominal:     General: Bowel sounds are normal. There is no distension.     Palpations: Abdomen is soft. There is no mass.     Tenderness: There is no abdominal tenderness. There is no guarding or rebound.     Hernia: No hernia is present.     Comments: Obese.   Genitourinary:    Comments: No cva tenderness. Musculoskeletal:        General: No swelling or tenderness.     Cervical back: Normal range of motion and neck supple. No rigidity.  Skin:    General: Skin is warm and dry.     Findings: No rash.  Neurological:     General: No focal deficit present.     Mental Status:  He is alert and oriented to person, place, and time.     Cranial Nerves: No cranial nerve deficit.     Comments: Alert, speech clear/fluent. No dysarthria or asphasia. Motor intact bil, stre 5/5. Sens grossly intact. Steady gait, no ataxia.   Psychiatric:        Mood and Affect: Mood normal.     ED Results / Procedures / Treatments   Labs (all labs ordered are listed, but only abnormal results are displayed) Results for orders placed or performed during the hospital encounter of [id number]  Basic metabolic panel  Result Value Ref Range   Sodium 141 135 - 145 mmol/L   Potassium 4.0 3.5 -  5.1 mmol/L   Chloride 103 98 - 111 mmol/L   CO2 27 22 - 32 mmol/L   Glucose, Bld 116 (H) 70 - 99 mg/dL   BUN 12 6 - 20 mg/dL   Creatinine, Ser 0.94 0.61 - 1.24 mg/dL   Calcium 9.5 8.9 - 10.3 mg/dL   GFR calc non Af Amer >60 >60 mL/min   GFR calc Af Amer >60 >60 mL/min   Anion gap 11 5 - 15  CBC  Result Value Ref Range   WBC 6.6 4.0 - 10.5 K/uL   RBC 4.50 4.22 - 5.81 MIL/uL   Hemoglobin 13.2 13.0 - 17.0 g/dL   HCT 40.0 39.0 - 52.0 %   MCV 88.9 80.0 - 100.0 fL   MCH 29.3 26.0 - 34.0 pg   MCHC 33.0 30.0 - 36.0 g/dL   RDW 14.5 11.5 - 15.5 %   Platelets 220 150 - 400 K/uL   nRBC 0.0 0.0 - 0.2 %    EKG None  Radiology CT HEAD WO CONTRAST  Result Date: 09/17/2019 CLINICAL DATA:  Headache. 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 EXAM: CT HEAD WITHOUT CONTRAST TECHNIQUE: Contiguous axial images were obtained from the base of the skull through the vertex without intravenous contrast. COMPARISON:  Brain MR 06/28/2016.  CT 06/28/2016. FINDINGS: Brain: Previously identified infiltrative mass lesion at the central skull base appears to have at least been partially resected since previous. Evaluation for residual tumor limited on this noncontrast CT examination. Small amount of hyperdensity seen within the fourth ventricle, suspicious for acute intraventricular hemorrhage (series 3, image  11). This measures up to 1 cm in maximal diameter. Overall ventricular size and morphology is fairly stable without obstructive hydrocephalus or trapping. No other acute intracranial hemorrhage. No acute large vessel territory infarct. No other mass lesion, mass effect, or midline shift. No extra-axial fluid collection. Vascular: No hyperdense vessel. Scattered vascular calcifications noted within the carotid siphons. Skull: Scalp soft tissues demonstrate no acute abnormality. Calvarium intact. Sinuses/Orbits: Globes and orbital soft tissues within normal limits. Small left maxillary sinus retention cyst noted. Paranasal sinuses are otherwise largely clear. Chronic right mastoid and middle ear effusion noted. IMPRESSION: 1. Small volume acute intraventricular hemorrhage involving the fourth ventricle. No evidence for obstructive hydrocephalus. 2. Suspected at least partial interval resection of previously identified infiltrative mass lesion at the central skull base. Evaluation for residual tumor limited on this noncontrast CT examination. 3. No other acute intracranial abnormality. 4. Chronic right mastoid and middle ear effusion. Critical Value/emergent results were called by telephone at the time of interpretation on 09/17/2019 at 7:38 pm to provider [facility] , who verbally acknowledged these results. Electronically Signed   By: Jeannine Boga M.D.   On: 09/17/2019 19:39    Procedures Procedures (including critical care time)  Medications Ordered in ED Medications  sodium chloride flush (NS) 0.9 % injection 3 mL (has no administration in time range)  sodium chloride 0.9 % bolus 1,000 mL (has no administration in time range)  ondansetron (ZOFRAN) injection 4 mg (has no administration in time range)  morphine 4 MG/ML injection 4 mg (has no administration in time range)    ED Course  I have reviewed the triage vital signs and the nursing notes.  Pertinent labs & imaging results that were  available during my care of the patient were reviewed by me and considered in my medical decision making (see chart for details).    MDM Rules/Calculators/A&P  Stat labs. Continuous pulse ox and monitor. CT imaging ordered stat.   MDM Number of Diagnoses or Management Options   Amount and/or Complexity of Data Reviewed Clinical lab tests: ordered and reviewed Tests in the radiology section of CPT: ordered and reviewed Tests in the medicine section of CPT: ordered and reviewed Discussion of test results with the performing providers: yes Decide to obtain previous medical records or to obtain history from someone other than the patient: yes Obtain history from someone other than the patient: yes Review and summarize past medical records: yes Discuss the patient with other providers: yes Independent visualization of images, tracings, or specimens: yes  Risk of Complications, Morbidity, and/or Mortality Presenting problems: high Diagnostic procedures: high Management options: high   Reviewed nursing notes and prior charts for additional history.   Iv ns bolus. zofran iv, morphine iv.   Labs reviewed/interpreted by me - wbc normal, chem normal. Await ct.   Recheck, headache much improved.   CT reviewed/interpreted by me - small amount acute blood 4th ventricle.   Neurosurgery consulted.  Await call back. NS re-paged.   Discussed w Dr Ronnald Ramp, NS - he reviewed current ct and prior records, states admit to neurology, he will leave consult note, states target bp in 120-140 range.   Cleviprex gtt ordered. Neurology consulted. Discussed pt with Dr Rory Percy - he will see/admit.   CRITICAL CARE RE: acute intraventricular hemorrhage, uncontrolled hypertension, hx pituitary mass.  Performed by: Mirna Mires Total critical care time: 45 minutes Critical care time was exclusive of separately billable procedures and treating other patients. Critical care was  necessary to treat or prevent imminent or life-threatening deterioration. Critical care was time spent personally by me on the following activities: development of treatment plan with patient and/or surrogate as well as nursing, discussions with consultants, evaluation of patient's response to treatment, examination of patient, obtaining history from patient or surrogate, ordering and performing treatments and interventions, ordering and review of laboratory studies, ordering and review of radiographic studies, pulse oximetry and re-evaluation of patient's condition.       Final Clinical Impression(s) / ED Diagnoses Final diagnoses:  None    Rx / DC Orders ED Discharge Orders    None       Lajean Saver, MD 09/17/19 2114

## 2019-09-17 NOTE — ED Notes (Signed)
Report given to Beckie Busing, RN in ED and [place] RN on 4N

## 2019-09-18 ENCOUNTER — Other Ambulatory Visit: Payer: Self-pay | Admitting: Student

## 2019-09-18 ENCOUNTER — Inpatient Hospital Stay (HOSPITAL_COMMUNITY): Payer: 59

## 2019-09-18 DIAGNOSIS — I161 Hypertensive emergency: Secondary | ICD-10-CM

## 2019-09-18 DIAGNOSIS — I10 Essential (primary) hypertension: Secondary | ICD-10-CM

## 2019-09-18 DIAGNOSIS — I671 Cerebral aneurysm, nonruptured: Secondary | ICD-10-CM

## 2019-09-18 DIAGNOSIS — E236 Other disorders of pituitary gland: Secondary | ICD-10-CM

## 2019-09-18 HISTORY — PX: IR ANGIO INTRA EXTRACRAN SEL COM CAROTID INNOMINATE BILAT MOD SED: IMG5360

## 2019-09-18 HISTORY — PX: IR ANGIO VERTEBRAL SEL VERTEBRAL UNI R MOD SED: IMG5368

## 2019-09-18 HISTORY — PX: IR ANGIO VERTEBRAL SEL SUBCLAVIAN INNOMINATE UNI L MOD SED: IMG5364

## 2019-09-18 HISTORY — PX: IR US GUIDE VASC ACCESS RIGHT: IMG2390

## 2019-09-18 LAB — HEMOGLOBIN A1C
Hgb A1c MFr Bld: 4.7 % — ABNORMAL LOW (ref 4.8–5.6)
Mean Plasma Glucose: 88.19 mg/dL

## 2019-09-18 LAB — ECHOCARDIOGRAM COMPLETE
Height: 71 in
Weight: 6694.93 oz

## 2019-09-18 LAB — GLUCOSE, CAPILLARY: Glucose-Capillary: 103 mg/dL — ABNORMAL HIGH (ref 70–99)

## 2019-09-18 LAB — MRSA PCR SCREENING: MRSA by PCR: NEGATIVE

## 2019-09-18 MED ORDER — MIDAZOLAM HCL 2 MG/2ML IJ SOLN
INTRAMUSCULAR | Status: AC
  Filled 2019-09-18: qty 2

## 2019-09-18 MED ORDER — VERAPAMIL HCL 2.5 MG/ML IV SOLN
INTRAVENOUS | Status: AC
  Filled 2019-09-18: qty 2

## 2019-09-18 MED ORDER — IOHEXOL 240 MG/ML SOLN
INTRAMUSCULAR | Status: AC
  Administered 2019-09-18: 75 mL
  Filled 2019-09-18: qty 100

## 2019-09-18 MED ORDER — LOSARTAN POTASSIUM 50 MG PO TABS
100.0000 mg | ORAL_TABLET | Freq: Every day | ORAL | Status: DC
  Administered 2019-09-18 – 2019-09-26 (×8): 100 mg via ORAL
  Filled 2019-09-18 (×8): qty 2

## 2019-09-18 MED ORDER — CHLORHEXIDINE GLUCONATE CLOTH 2 % EX PADS
6.0000 | MEDICATED_PAD | Freq: Every day | CUTANEOUS | Status: DC
  Administered 2019-09-18 – 2019-09-27 (×8): 6 via TOPICAL

## 2019-09-18 MED ORDER — LIDOCAINE HCL (PF) 1 % IJ SOLN
INTRAMUSCULAR | Status: AC | PRN
  Administered 2019-09-18: 1 mL

## 2019-09-18 MED ORDER — PANTOPRAZOLE SODIUM 40 MG PO TBEC
40.0000 mg | DELAYED_RELEASE_TABLET | Freq: Every day | ORAL | Status: DC
  Administered 2019-09-18 – 2019-09-27 (×9): 40 mg via ORAL
  Filled 2019-09-18 (×9): qty 1

## 2019-09-18 MED ORDER — AMLODIPINE BESYLATE 10 MG PO TABS
10.0000 mg | ORAL_TABLET | Freq: Every day | ORAL | Status: DC
  Administered 2019-09-18 – 2019-09-27 (×9): 10 mg via ORAL
  Filled 2019-09-18 (×9): qty 1

## 2019-09-18 MED ORDER — HYDRALAZINE HCL 50 MG PO TABS
100.0000 mg | ORAL_TABLET | Freq: Three times a day (TID) | ORAL | Status: DC
  Administered 2019-09-18 – 2019-09-27 (×26): 100 mg via ORAL
  Filled 2019-09-18 (×27): qty 2

## 2019-09-18 MED ORDER — NITROGLYCERIN 1 MG/10 ML FOR IR/CATH LAB
INTRA_ARTERIAL | Status: AC
  Filled 2019-09-18: qty 10

## 2019-09-18 MED ORDER — HEPARIN SODIUM (PORCINE) 1000 UNIT/ML IJ SOLN
INTRAMUSCULAR | Status: AC
  Filled 2019-09-18: qty 1

## 2019-09-18 MED ORDER — VERAPAMIL HCL 2.5 MG/ML IV SOLN
INTRAVENOUS | Status: AC
  Filled 2019-09-18: qty 4

## 2019-09-18 MED ORDER — LIDOCAINE HCL 1 % IJ SOLN
INTRAMUSCULAR | Status: AC
  Filled 2019-09-18: qty 20

## 2019-09-18 MED ORDER — MIDAZOLAM HCL 2 MG/2ML IJ SOLN
INTRAMUSCULAR | Status: AC | PRN
  Administered 2019-09-18: 0.5 mg via INTRAVENOUS

## 2019-09-18 MED ORDER — PERFLUTREN LIPID MICROSPHERE
1.0000 mL | INTRAVENOUS | Status: AC | PRN
  Administered 2019-09-18: 2 mL via INTRAVENOUS
  Filled 2019-09-18: qty 10

## 2019-09-18 MED ORDER — FENTANYL CITRATE (PF) 100 MCG/2ML IJ SOLN
INTRAMUSCULAR | Status: AC
  Filled 2019-09-18: qty 2

## 2019-09-18 MED ORDER — FENTANYL CITRATE (PF) 100 MCG/2ML IJ SOLN
INTRAMUSCULAR | Status: AC | PRN
  Administered 2019-09-18: 50 ug via INTRAVENOUS

## 2019-09-18 NOTE — Evaluation (Signed)
Physical Therapy Evaluation Patient Details Name: Andrew Perez MRN: [id number] DOB: 12-19-1965 Today's Date: 09/18/2019   History of Present Illness  54 yo male admitted with IVH of 4th ventricle  PMH HTN  morbid obese previous IVH 2018  Clinical Impression  Pt presents to PT with deficits in gait and activity tolerance, assessment limited by mobility orders at this time with recent radial cath from cerebral angiogram and BP orders for systolic 0000000. RN Ander Purpura present during session, reporting pt recently received his BP medications, also titrating CLEVIPREX during session to help maintain BP orders. Pt mobilizes without physical assistance at this time, however over a limited distances 2/2 current restrictions. Pt with planned aneurysm coiling tomorrow, PT will follow up after procedure once medically stable to re-evaluate functional status and mobility.    Follow Up Recommendations No PT follow up    Equipment Recommendations  None recommended by PT    Recommendations for Other Services       Precautions / Restrictions Precautions Precautions: Fall Precaution Comments: systolic 0000000, RUE elevated post-cath Restrictions Weight Bearing Restrictions: No      Mobility  Bed Mobility Overal bed mobility: Needs Assistance Bed Mobility: Supine to Sit     Supine to sit: Supervision;HOB elevated     General bed mobility comments: pt moving all extremities supine but does not complete supine to sit due to BP   Transfers Overall transfer level: Needs assistance Equipment used: None Transfers: Sit to/from Stand Sit to Stand: Supervision         General transfer comment: cues to not push with RUE s/p cath  Ambulation/Gait Ambulation/Gait assistance: Supervision Gait Distance (Feet): 3 Feet Assistive device: None Gait Pattern/deviations: Step-to pattern Gait velocity: reduced Gait velocity interpretation: <1.8 ft/sec, indicate of risk for recurrent falls General Gait  Details: short steps with widened BOS from bed to recliner  Stairs            Wheelchair Mobility    Modified Rankin (Stroke Patients Only) Modified Rankin (Stroke Patients Only) Pre-Morbid Rankin Score: No symptoms Modified Rankin: No significant disability     Balance Overall balance assessment: Needs assistance Sitting-balance support: No upper extremity supported;Feet supported Sitting balance-Leahy Scale: Good Sitting balance - Comments: supervision, bending forward to don shoes   Standing balance support: No upper extremity supported Standing balance-Leahy Scale: Good Standing balance comment: supervision                             Pertinent Vitals/Pain Pain Assessment: No/denies pain    Home Living Family/patient expects to be discharged to:: Private residence Living Arrangements: Spouse/significant other Available Help at Discharge: Family Type of Home: House Home Access: Stairs to enter Entrance Stairs-Rails: Psychiatric nurse of Steps: 5 Home Layout: One level Home Equipment: Environmental consultant - 2 wheels;Bedside commode;Wheelchair - manual;Shower seat      Prior Function Level of Independence: Independent         Comments: works as a Radiographer, therapeutic: Right    Extremity/Trunk Assessment   Upper Extremity Assessment Upper Extremity Assessment: Overall WFL for tasks assessed    Lower Extremity Assessment Lower Extremity Assessment: Overall WFL for tasks assessed    Cervical / Trunk Assessment Cervical / Trunk Assessment: Other exceptions Cervical / Trunk Exceptions: forward head 2/2 body habitus  Communication   Communication: No difficulties  Cognition Arousal/Alertness: Awake/alert Behavior During Therapy: WFL for tasks assessed/performed  Overall Cognitive Status: Within Functional Limits for tasks assessed                                        General Comments General  comments (skin integrity, edema, etc.): BP pre-mobility 155/75, post-mobility 151/68, RN reporting pt recently taking BP meds and present to titrate CLEVIPREX as needed to maintain systolic 0000000    Exercises     Assessment/Plan    PT Assessment Patient needs continued PT services  PT Problem List Decreased mobility;Decreased activity tolerance;Decreased balance       PT Treatment Interventions DME instruction;Gait training;Stair training;Functional mobility training;Therapeutic activities;Therapeutic exercise;Balance training;Neuromuscular re-education;Patient/family education    PT Goals (Current goals can be found in the Care Plan section)  Acute Rehab PT Goals Patient Stated Goal: To return to independent mobility PT Goal Formulation: With patient Time For Goal Achievement: 10/02/19 Potential to Achieve Goals: Good Additional Goals Additional Goal #1: Pt will maintain balance within 10 inches of his base of support without UE support, independently.    Frequency Min 4X/week   Barriers to discharge        Co-evaluation               AM-PAC PT "6 Clicks" Mobility  Outcome Measure Help needed turning from your back to your side while in a flat bed without using bedrails?: None Help needed moving from lying on your back to sitting on the side of a flat bed without using bedrails?: None Help needed moving to and from a bed to a chair (including a wheelchair)?: None Help needed standing up from a chair using your arms (e.g., wheelchair or bedside chair)?: None Help needed to walk in hospital room?: A Little Help needed climbing 3-5 steps with a railing? : A Little 6 Click Score: 22    End of Session   Activity Tolerance: Patient tolerated treatment well Patient left: with call bell/phone within reach;with family/visitor present;in chair Nurse Communication: Mobility status PT Visit Diagnosis: Other symptoms and signs involving the nervous system (R29.898);Other  abnormalities of gait and mobility (R26.89)    Time: [id number] PT Time Calculation (min) (ACUTE ONLY): 10 min   Charges:   PT Evaluation $PT Eval Moderate Complexity: 1 Mod          Zenaida Niece, PT, DPT Acute Rehabilitation Pager: [phone]   Zenaida Niece 09/18/2019, 5:45 PM

## 2019-09-18 NOTE — Progress Notes (Signed)
Patient ID: Andrew Perez, male   DOB: Sep 01, 1965, 54 y.o.   MRN: [id number] Patient arouses easily from sleep.  He is nonfocal.  He moves all extremities.  He answers questions.  CT scan from this morning essentially unchanged.  No hydrocephalus.  CT angiogram ordered.  We will sign off now, please call if we can be of any further assistance

## 2019-09-18 NOTE — Progress Notes (Signed)
Referring Physician(s): Amie Portland  Supervising Physician: Pedro Earls  Patient Status:  [facility] - In-pt  Chief Complaint: None  Subjective:  History of IVH s/p diagnostic cerebral arteriogram this AM by Dr. Karenann Cai revealing a right PICA aneurysm. Patient awake and alert sitting in bed. Accompanied by wife at bedside. Right radial incision c/d/i with TR band in place. Denies fever, chills, chest pain, dyspnea, abdominal pain, or headache.   Allergies: Codeine, Other, and Shellfish allergy  Medications: Prior to Admission medications   Medication Sig Start Date End Date Taking? Authorizing Provider  amLODipine (NORVASC) 10 MG tablet Take 1 tablet (10 mg total) by mouth daily. 07/04/16  Yes Rinehuls, Early Chars, PA-C  cabergoline (DOSTINEX) 0.5 MG tablet Take 0.5 mg by mouth 2 (two) times a week. Saturday and wednesday 06/24/16  Yes [provider]  cloNIDine (CATAPRES - DOSED IN MG/24 HR) 0.3 mg/24hr patch Place 0.3 mg onto the skin every 7 (seven) days. Saturday 03/12/18  Yes [provider]  hydrALAZINE (APRESOLINE) 100 MG tablet Take 1 tablet (100 mg total) by mouth every 8 (eight) hours. Patient taking differently: Take 100 mg by mouth 3 (three) times daily.  07/03/16  Yes Rinehuls, Early Chars, PA-C  levothyroxine (SYNTHROID, LEVOTHROID) 100 MCG tablet Take 100 mcg by mouth daily. 02/22/18  Yes [provider]  losartan (COZAAR) 100 MG tablet Take 100 mg by mouth daily. 03/20/18  Yes [provider]  cloNIDine (CATAPRES) 0.1 MG tablet One tablet three times daily Patient not taking: Reported on 04/13/2018 07/03/16   Rinehuls, Early Chars, PA-C  lisinopril (PRINIVIL,ZESTRIL) 20 MG tablet Take 1 tablet (20 mg total) by mouth 2 (two) times daily. Patient not taking: Reported on 04/13/2018 07/03/16   Rinehuls, Early Chars, PA-C     Vital Signs: BP 134/69   Pulse 65   Temp 98.4 F (36.9 C) (Axillary)   Resp 16   Ht 5\' 11"   (1.803 m)   Wt (!) 418 lb 6.9 oz (189.8 kg)   SpO2 (!) 87%   BMI 58.36 kg/m   Physical Exam Vitals and nursing note reviewed.  Constitutional:      General: He is not in acute distress.    Appearance: Normal appearance.  Cardiovascular:     Rate and Rhythm: Normal rate and regular rhythm.     Heart sounds: Normal heart sounds. No murmur.  Pulmonary:     Effort: Pulmonary effort is normal. No respiratory distress.     Breath sounds: Normal breath sounds. No wheezing.  Skin:    General: Skin is warm and dry.     Comments: Right radial incision soft with TR band in place, no active bleeding or hematoma.  Neurological:     Mental Status: He is alert and oriented to person, place, and time.     Comments: Can spontaneously move all extremities. Radial pulses 2+ bilaterally.     Imaging: CT HEAD WO CONTRAST  Result Date: 09/18/2019 CLINICAL DATA:  Follow-up subarachnoid hemorrhage EXAM: CT HEAD WITHOUT CONTRAST TECHNIQUE: Contiguous axial images were obtained from the base of the skull through the vertex without intravenous contrast. COMPARISON:  Head CT from yesterday and 06/28/2016 FINDINGS: Brain: Slight interval redistribution of small volume intraventricular hemorrhage seen at the fourth, third, and lateral ventricles. Potentially parenchymal high-density at the roof of the fourth ventricle. Fourth ventricular hemorrhage was seen in 2018 as well. Lytic and low-density skull base mass with well-defined margins and very low-density. 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 No evidence of infarct.  No hydrocephalus. Vascular: Negative by noncontrast CT Skull: Tumoral skull base erosion centered at the sella. Sinuses/Orbits: Negative IMPRESSION: 1. Allowing for redistribution, no convincing increase in the intraventricular hemorrhage. No hydrocephalus. 2. Fourth ventricular hemorrhage was also seen in 2018, recommend updated MRI/MRA to evaluate for underlying lesion.  3. Known sellar mass with skull base erosion. Electronically Signed   By: Monte Fantasia M.D.   On: 09/18/2019 04:36   CT HEAD WO CONTRAST  Result Date: 09/17/2019 CLINICAL DATA:  Headache. 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 EXAM: CT HEAD WITHOUT CONTRAST TECHNIQUE: Contiguous axial images were obtained from the base of the skull through the vertex without intravenous contrast. COMPARISON:  Brain MR 06/28/2016.  CT 06/28/2016. FINDINGS: Brain: Previously identified infiltrative mass lesion at the central skull base appears to have at least been partially resected since previous. Evaluation for residual tumor limited on this noncontrast CT examination. Small amount of hyperdensity seen within the fourth ventricle, suspicious for acute intraventricular hemorrhage (series 3, image 11). This measures up to 1 cm in maximal diameter. Overall ventricular size and morphology is fairly stable without obstructive hydrocephalus or trapping. No other acute intracranial hemorrhage. No acute large vessel territory infarct. No other mass lesion, mass effect, or midline shift. No extra-axial fluid collection. Vascular: No hyperdense vessel. Scattered vascular calcifications noted within the carotid siphons. Skull: Scalp soft tissues demonstrate no acute abnormality. Calvarium intact. Sinuses/Orbits: Globes and orbital soft tissues within normal limits. Small left maxillary sinus retention cyst noted. Paranasal sinuses are otherwise largely clear. Chronic right mastoid and middle ear effusion noted. IMPRESSION: 1. Small volume acute intraventricular hemorrhage involving the fourth ventricle. No evidence for obstructive hydrocephalus. 2. Suspected at least partial interval resection of previously identified infiltrative mass lesion at the central skull base. Evaluation for residual tumor limited on this noncontrast CT examination. 3. No other acute intracranial abnormality. 4. Chronic right mastoid and  middle ear effusion. Critical Value/emergent results were called by telephone at the time of interpretation on 09/17/2019 at 7:38 pm to provider [facility] , who verbally acknowledged these results. Electronically Signed   By: Jeannine Boga M.D.   On: 09/17/2019 19:39    Labs:  CBC: Recent Labs    09/17/19 1619  WBC 6.6  HGB 13.2  HCT 40.0  PLT 220    COAGS: No results for input(s): INR, APTT in the last 8760 hours.  BMP: Recent Labs    09/17/19 1619  NA 141  K 4.0  CL 103  CO2 27  GLUCOSE 116*  BUN 12  CALCIUM 9.5  CREATININE 0.94  GFRNONAA >60  GFRAA >60     Assessment and Plan:  History of IVH s/p diagnostic cerebral arteriogram this AM by Dr. Karenann Cai revealing a right PICA aneurysm. Dr. Karenann Cai at bedside to discuss findings of diagnostic cerebral arteriogram with patient and wife. Recommend treatment (embolization) of right PICA aneurysm, as this is probable cause of both IVH (current and 2018). Discussed procedure, including risks and benefits. At this time, patient wishes to think about procedure before agreeing to proceed- will tentatively set up patient for an image-guided cerebral arteriogram with possible embolization of right PICA aneurysm tentatively for tomorrow 09/19/2019 at 0930 in IR with Dr. Karenann Cai pending patient agreement- will obtain consent tomorrow AM prior to procedure. Patient will be NPO at midnight. Afebrile. He does not take blood  thinners. INR pending.  Risks and benefits of cerebral arteriogram with intervention were discussed with the patient including, but not limited to bleeding, infection, vascular injury, contrast induced renal failure, stroke, reperfusion hemorrhage, or even death. This interventional procedure involves the use of X-rays and because of the nature of the planned procedure, it is possible that we will have prolonged use of X-ray fluoroscopy. Potential radiation risks to you  include (but are not limited to) the following: - A slightly elevated risk for cancer  several years later in life. This risk is typically less than 0.5% percent. This risk is low in comparison to the normal incidence of human cancer, which is 33% for women and 50% for men according to the [employer]. - Radiation induced injury can include skin redness, resembling a rash, tissue breakdown / ulcers and hair loss (which can be temporary or permanent).  The likelihood of either of these occurring depends on the difficulty of the procedure and whether you are sensitive to radiation due to previous procedures, disease, or genetic conditions.  IF your procedure requires a prolonged use of radiation, you will be notified and given written instructions for further action.  It is your responsibility to monitor the irradiated area for the 2 weeks following the procedure and to notify your physician if you are concerned that you have suffered a radiation induced injury.   All of the patient's questions were answered- at this time patient wishes to think about procedure before giving consent- will obtain consent tomorrow AM prior to procedure.   Electronically Signed: Earley Abide, PA-C 09/18/2019, 3:09 PM   I spent a total of 35 Minutes at the the patient's bedside AND on the patient's hospital floor or unit, greater than 50% of which was counseling/coordinating care for right PICA aneurysm/embolization.

## 2019-09-18 NOTE — Progress Notes (Signed)
STROKE TEAM PROGRESS NOTE   INTERVAL HISTORY His PT/OT and wife are at the bedside.  Pt lying in bed, comfortably. Awake alert and not in distress. Neuro intact, HA better, but still on cleviprex. Pending angiogram today.   Vitals:   09/18/19 0930 09/18/19 0945 09/18/19 1000 09/18/19 1145  BP: (!) 124/54 124/61 127/62 (!) 161/79  Pulse: 61 (!) 58 (!) 59 70  Resp: 17 15 13 18   Temp:      TempSrc:      SpO2: (!) 85% 91% 93% 95%  Weight:      Height:        CBC:  Recent Labs  Lab 09/17/19 1619  WBC 6.6  HGB 13.2  HCT 40.0  MCV 88.9  PLT [id number]    Basic Metabolic Panel:  Recent Labs  Lab 09/17/19 1619  NA 141  K 4.0  CL 103  CO2 27  GLUCOSE 116*  BUN 12  CREATININE 0.94  CALCIUM 9.5   Lipid Panel:     Component Value Date/Time   CHOL 148 06/29/2016 0242   TRIG 73 06/29/2016 0242   HDL 36 (L) 06/29/2016 0242   CHOLHDL 4.1 06/29/2016 0242   VLDL 15 06/29/2016 0242   LDLCALC 97 06/29/2016 0242   HgbA1c:  Lab Results  Component Value Date   HGBA1C 4.7 (L) 09/18/2019   Urine Drug Screen:     Component Value Date/Time   LABOPIA POSITIVE (A) 09/17/2019 2126   COCAINSCRNUR NONE DETECTED 09/17/2019 2126   LABBENZ NONE DETECTED 09/17/2019 2126   AMPHETMU NONE DETECTED 09/17/2019 2126   THCU NONE DETECTED 09/17/2019 2126   LABBARB NONE DETECTED 09/17/2019 2126    Alcohol Level No results found for: ETH  IMAGING past 24 hours CT HEAD WO CONTRAST  Result Date: 09/18/2019 CLINICAL DATA:  Follow-up subarachnoid hemorrhage EXAM: CT HEAD WITHOUT CONTRAST TECHNIQUE: Contiguous axial images were obtained from the base of the skull through the vertex without intravenous contrast. COMPARISON:  Head CT from yesterday and 06/28/2016 FINDINGS: Brain: Slight interval redistribution of small volume intraventricular hemorrhage seen at the fourth, third, and lateral ventricles. Potentially parenchymal high-density at the roof of the fourth ventricle. Fourth ventricular hemorrhage  was seen in 2018 as well. Lytic and low-density skull base mass with well-defined margins and very low-density. 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 No evidence of infarct.  No hydrocephalus. Vascular: Negative by noncontrast CT Skull: Tumoral skull base erosion centered at the sella. Sinuses/Orbits: Negative IMPRESSION: 1. Allowing for redistribution, no convincing increase in the intraventricular hemorrhage. No hydrocephalus. 2. Fourth ventricular hemorrhage was also seen in 2018, recommend updated MRI/MRA to evaluate for underlying lesion. 3. Known sellar mass with skull base erosion. Electronically Signed   By: Monte Fantasia M.D.   On: 09/18/2019 04:36   CT HEAD WO CONTRAST  Result Date: 09/17/2019 CLINICAL DATA:  Headache. 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 EXAM: CT HEAD WITHOUT CONTRAST TECHNIQUE: Contiguous axial images were obtained from the base of the skull through the vertex without intravenous contrast. COMPARISON:  Brain MR 06/28/2016.  CT 06/28/2016. FINDINGS: Brain: Previously identified infiltrative mass lesion at the central skull base appears to have at least been partially resected since previous. Evaluation for residual tumor limited on this noncontrast CT examination. Small amount of hyperdensity seen within the fourth ventricle, suspicious for acute intraventricular hemorrhage (series 3, image 11). This measures up to 1 cm in maximal diameter. Overall ventricular size and morphology is  fairly stable without obstructive hydrocephalus or trapping. No other acute intracranial hemorrhage. No acute large vessel territory infarct. No other mass lesion, mass effect, or midline shift. No extra-axial fluid collection. Vascular: No hyperdense vessel. Scattered vascular calcifications noted within the carotid siphons. Skull: Scalp soft tissues demonstrate no acute abnormality. Calvarium intact. Sinuses/Orbits: Globes and orbital soft tissues  within normal limits. Small left maxillary sinus retention cyst noted. Paranasal sinuses are otherwise largely clear. Chronic right mastoid and middle ear effusion noted. IMPRESSION: 1. Small volume acute intraventricular hemorrhage involving the fourth ventricle. No evidence for obstructive hydrocephalus. 2. Suspected at least partial interval resection of previously identified infiltrative mass lesion at the central skull base. Evaluation for residual tumor limited on this noncontrast CT examination. 3. No other acute intracranial abnormality. 4. Chronic right mastoid and middle ear effusion. Critical Value/emergent results were called by telephone at the time of interpretation on 09/17/2019 at 7:38 pm to provider [facility] , who verbally acknowledged these results. Electronically Signed   By: Jeannine Boga M.D.   On: 09/17/2019 19:39    PHYSICAL EXAM  Temp:  [97.7 F (36.5 C)-98.5 F (36.9 C)] 98.4 F (36.9 C) (06/02 0800) Pulse Rate:  [57-73] 70 (06/02 1145) Resp:  [12-24] 18 (06/02 1145) BP: (116-183)/(54-102) 161/79 (06/02 1145) SpO2:  [85 %-100 %] 95 % (06/02 1145) Weight:  [189.8 kg-199.6 kg] 189.8 kg (06/02 0018)  General - morbid obesity, well developed, in no apparent distress.  Ophthalmologic - fundi not visualized due to noncooperation.  Cardiovascular - Regular rhythm and rate.  Mental Status -  Level of arousal and orientation to time, place, and person were intact. Language including expression, naming, repetition, comprehension was assessed and found intact. Attention span and concentration were normal. Fund of Knowledge was assessed and was intact.  Cranial Nerves II - XII - II - Visual field intact OU. III, IV, VI - Extraocular movements intact. V - Facial sensation intact bilaterally. VII - Facial movement intact bilaterally. VIII - Hearing & vestibular intact bilaterally. X - Palate elevates symmetrically. XI - Chin turning & shoulder shrug intact  bilaterally. XII - Tongue protrusion intact.  Motor Strength - The patient's strength was normal in all extremities and pronator drift was absent.  Bulk was normal and fasciculations were absent.   Motor Tone - Muscle tone was assessed at the neck and appendages and was normal.  Reflexes - The patient's reflexes were symmetrical in all extremities and he had no pathological reflexes.  Sensory - Light touch, temperature/pinprick were assessed and were symmetrical.    Coordination - The patient had normal movements in the hands and feet with no ataxia or dysmetria.  Tremor was absent.  Gait and Station - deferred.   ASSESSMENT/PLAN Andrew Perez is a 54 y.o. 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, an endocrinologist at [facility], on cabergoline therapy March 2018 with decrease in mass size on follow-up imaging being followed by surveillance with MRIs, morbid obesity, presenting with dizziness w/ episode of vomiting followed by headache. SBP > 190.  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 small volume IVH in 4th ventricle. Interval resection previous central skull base infiltrative mass. 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 No hydrocephalus. Known sellar mass w/ skull base erosion.  Cerebral angio right PICA 12mm aneurysm   2D Echo pending  LDL pending   HgbA1c 4.7  SCDs for VTE prophylaxis  No antithrombotic prior to admission, now on No antithrombotic given hemorrhage   Therapy recommendations:  pending   Disposition:  pending   Right PICA aneurysm  Cerebral angiogram showed right PICA 5 mm aneurysm  Plan for aneurysm coiling tomorrow  N.p.o. 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  Carotid Doppler negative.  EF 55 to 60%.  LDL 27 and A1c 4.6.  CTA head at that time showed right PICA patent.  Prolactinoma    followed by Dr. Maurie Boettcher and endocrinologist at [facility]  on cabergoline therapy since March 2018 with decrease in mass size on follow-up imaging  being followed by surveillance with MRIs  Hypertensive Emergency  SBP > 190 on arrival   Home meds:  amlodipine 10, hydralazine 100 q8, losartan 100  Now on cleviprex, wean as able  Resume home medication . SBP goal < 140  . Long-term BP goal normotensive  Other Stroke Risk Factors  Morbid Obesity, Body mass index is 58.36 kg/m., recommend weight loss, diet and exercise as appropriate   Obstructive sleep apnea  Other Active Problems  Hypothyroid on sunthroid  Hospital day # 1  This patient is critically ill due to IVH, hypertensive emergency, history of IVH and at significant risk of neurological worsening, death form recurrent IVH, worsening IVH, hydrocephalus, hypertensive encephalopathy, seizure. This patient's care requires constant monitoring of vital signs, hemodynamics, respiratory and cardiac monitoring, review of multiple databases, neurological assessment, discussion with family, other specialists and medical decision making of high complexity. I spent 35 minutes of neurocritical care time in the care of this patient. I had long discussion with patient and wife at bedside, updated pt current condition, treatment plan and potential prognosis, and answered all the questions.  They expressed understanding and appreciation.  I also discussed with Dr. Norma Fredrickson.  Rosalin Hawking, MD PhD Stroke Neurology 09/18/2019 4:56 PM  To contact Stroke Continuity provider, please refer to [email or web address] After hours, contact General Neurology

## 2019-09-18 NOTE — Progress Notes (Signed)
Echocardiogram 2D Echocardiogram has been performed.  Oneal Deputy Deicy Rusk 09/18/2019, 10:08 AM

## 2019-09-18 NOTE — Procedures (Addendum)
INTERVENTIONAL NEURORADIOLOGY BRIEF POSTPROCEDURE NOTE  Diagnostic cerebral angiogram  Attending: Dr. Pedro Earls  Assistant: None  Diagnosis: IVH  Access site: Right radial artery  Access closure: Inflatable band  Anesthesia: Moderate sedation  Medication used: 0.5 mg Versed IV; 1mcg Fentanyl IV.  Complications: None  Estimated blood loss: Minimal  Specimen: None  Findings: There is a 5 mm distal right PICA aneurysm. Otherwise, unremarkable cerebral angiogram.  The patient tolerated the procedure well without incident or complication and is in stable condition.

## 2019-09-18 NOTE — Progress Notes (Signed)
SLP Cancellation Note  Patient Details Name: Andrew Perez MRN: [id number] DOB: 12-17-1965   Cancelled treatment:       Reason Eval/Treat Not Completed: Patient at procedure or test/unavailable    Osie Bond., M.A. [facility] Pager [phone] Office [phone]  09/18/2019, 11:34 AM

## 2019-09-18 NOTE — Evaluation (Signed)
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 Pt currently with functional limitiations due to the deficits listed below (see OT problem list). Pt currently with limited evaluation due to pending procedure with SBP restriction of 140 or less. Pt sustaining elevated BP. OT attempting second session and pt currently with restricted UE with elevation requirements. OT to check back to see basic transfers with adls next session.  Pt will benefit from skilled OT to increase their independence and safety with adls and balance to allow discharge home .     Follow Up Recommendations  No OT follow up    Equipment Recommendations  None recommended by OT    Recommendations for Other Services       Precautions / Restrictions Precautions Precautions: Fall      Mobility Bed Mobility    MOD I to adjust            General bed mobility comments: pt moving all extremities supine but does not complete supine to sit due to BP   Transfers                 General transfer comment: .    Balance                                           ADL either performed or assessed with clinical judgement   ADL Overall ADL's : Needs assistance/impaired   Eating/Feeding Details (indicate cue type and reason): demonstrates ability to be indep via observation                                   General ADL Comments: pt with pending procedure and after multiple attempts BP remained at SBP 141      Vision Baseline Vision/History: Wears glasses       Perception     Praxis      Pertinent Vitals/Pain Pain Assessment: No/denies pain     Hand Dominance Right   Extremity/Trunk Assessment Upper Extremity  Assessment Upper Extremity Assessment: Overall WFL for tasks assessed(some limited range by body habitus)   Lower Extremity Assessment Lower Extremity Assessment: Overall WFL for tasks assessed   Cervical / Trunk Assessment Cervical / Trunk Assessment: Other exceptions Cervical / Trunk Exceptions: forward head due to body habitus   Communication Communication Communication: No difficulties   Cognition Arousal/Alertness: Awake/alert Behavior During Therapy: WFL for tasks assessed/performed Overall Cognitive Status: Within Functional Limits for tasks assessed                                     General Comments       Exercises     Shoulder Instructions      Home Living Family/patient expects to be discharged to:: Private residence Living Arrangements: Spouse/significant other Available Help at Discharge: Family Type of Home: House Home Access: Stairs to enter Technical brewer of Steps: 6 Entrance Stairs-Rails: Right Home Layout: One level     Bathroom Shower/Tub: Tub/shower unit  Bathroom Toilet: Handicapped height     Home Equipment: Shower seat;Grab bars - toilet          Prior Functioning/Environment Level of Independence: Independent        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 Problem List: Decreased activity tolerance;Impaired balance (sitting and/or standing)      OT Treatment/Interventions: Self-care/ADL training;Therapeutic exercise;DME and/or AE instruction;Therapeutic activities;Balance training    OT Goals(Current goals can be found in the care plan section) Acute Rehab OT Goals Patient Stated Goal: none stated OT Goal Formulation: With patient/family Time For Goal Achievement: 10/02/19 Potential to Achieve Goals: Good  OT Frequency: Min 2X/week   Barriers to D/C:            Co-evaluation              AM-PAC OT "6 Clicks" Daily Activity     Outcome Measure Help from another person eating meals?:  None Help from another person taking care of personal grooming?: None Help from another person toileting, which includes using toliet, bedpan, or urinal?: None Help from another person bathing (including washing, rinsing, drying)?: A Little Help from another person to put on and taking off regular upper body clothing?: None Help from another person to put on and taking off regular lower body clothing?: A Little 6 Click Score: 22   End of Session Nurse Communication: Mobility status;Precautions  Activity Tolerance: Patient tolerated treatment well Patient left: in bed;with call bell/phone within reach;with family/visitor present  OT Visit Diagnosis: Muscle weakness (generalized) (M62.81)                Time:  -    Charges:  OT General Charges $OT Visit: 1 Visit OT Evaluation $OT Eval Low Complexity: 1 Low   Brynn, OTR/L  Acute Rehabilitation Services Pager: [phone] Office: [phone] .   Jeri Modena 09/18/2019, 3:07 PM

## 2019-09-18 NOTE — Consult Note (Signed)
Chief Complaint: Patient was seen in consultation today for IVH/diagnostic cerebral arteriogram.  Referring Physician(s): Amie Portland  Supervising Physician: Pedro Earls  Patient Status: [facility] - In-pt  History of Present Illness: Andrew Perez is a 54 y.o. 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 He presented to [facility] ED 09/17/2019 with complaints of headache and dizziness with associated N/V. In ED, CT head revealed an acute IVH. He was admitted for further management. Neurology was consulted who recommended NIR consultation for possible diagnostic cerebral arteriogram to evaluate possible etiology of IVH.  CT head 09/17/2019: 1. Small volume acute intraventricular hemorrhage involving the fourth ventricle. No evidence for obstructive hydrocephalus. 2. Suspected at least partial interval resection of previously identified infiltrative mass lesion at the central skull base. Evaluation for residual tumor limited on this noncontrast CT examination. 3. No other acute intracranial abnormality. 4. Chronic right mastoid and middle ear effusion.  CT head this AM: 1. Allowing for redistribution, no convincing increase in the intraventricular hemorrhage. No hydrocephalus. 2. Fourth ventricular hemorrhage was also seen in 2018, recommend updated MRI/MRA to evaluate for underlying lesion. 3. Known sellar mass with skull base erosion.  NIR consulted by Dr. Rory Percy for possible image-guided diagnostic cerebral arteriogram. Patient awake and alert sitting in bed with no complaints at this time. States he has not had additional "dizzy episodes" since admission. States he had a "stress headache" earlier this AM while he was getting blood drawn, denies headaches at this present time. Denies fever, chills, chest pain, dyspnea, abdominal pain, or headache.   Past Medical History:  Diagnosis Date    Hypertension    Sleep apnea     Past Surgical History:  Procedure Laterality Date   TONSILLECTOMY      Allergies: Codeine, Other, and Shellfish allergy  Medications: Prior to Admission medications   Medication Sig Start Date End Date Taking? Authorizing Provider  amLODipine (NORVASC) 10 MG tablet Take 1 tablet (10 mg total) by mouth daily. 07/04/16  Yes Rinehuls, Early Chars, PA-C  cabergoline (DOSTINEX) 0.5 MG tablet Take 0.5 mg by mouth 2 (two) times a week. Saturday and wednesday 06/24/16  Yes [provider]  cloNIDine (CATAPRES - DOSED IN MG/24 HR) 0.3 mg/24hr patch Place 0.3 mg onto the skin every 7 (seven) days. Saturday 03/12/18  Yes [provider]  hydrALAZINE (APRESOLINE) 100 MG tablet Take 1 tablet (100 mg total) by mouth every 8 (eight) hours. Patient taking differently: Take 100 mg by mouth 3 (three) times daily.  07/03/16  Yes Rinehuls, Early Chars, PA-C  levothyroxine (SYNTHROID, LEVOTHROID) 100 MCG tablet Take 100 mcg by mouth daily. 02/22/18  Yes [provider]  losartan (COZAAR) 100 MG tablet Take 100 mg by mouth daily. 03/20/18  Yes [provider]  cloNIDine (CATAPRES) 0.1 MG tablet One tablet three times daily Patient not taking: Reported on 04/13/2018 07/03/16   Rinehuls, Early Chars, PA-C  lisinopril (PRINIVIL,ZESTRIL) 20 MG tablet Take 1 tablet (20 mg total) by mouth 2 (two) times daily. Patient not taking: Reported on 04/13/2018 07/03/16   Rinehuls, Early Chars, PA-C     History reviewed. No pertinent family history.  Social History   Socioeconomic History   Marital status: Married    Spouse name: Not on file   Number of children: Not on file   Years of education: Not on file   Highest education level: Not on file  Occupational History  Not on file  Tobacco Use   Smoking status: Never Smoker   Smokeless tobacco: Never Used  Substance and Sexual Activity   Alcohol use: No   Drug use: No   Sexual activity: Not on file    Other Topics Concern   Not on file  Social History Narrative   Not on file   Social Determinants of Health   Financial Resource Strain:    Difficulty of Paying Living Expenses:   Food Insecurity:    Worried About Charity fundraiser in the Last Year:    Arboriculturist in the Last Year:   Transportation Needs:    Film/video editor (Medical):    Lack of Transportation (Non-Medical):   Physical Activity:    Days of Exercise per Week:    Minutes of Exercise per Session:   Stress:    Feeling of Stress :   Social Connections:    Frequency of Communication with Friends and Family:    Frequency of Social Gatherings with Friends and Family:    Attends Religious Services:    Active Member of Clubs or Organizations:    Attends Archivist Meetings:    Marital Status:      Review of Systems: A 12 point ROS discussed and pertinent positives are indicated in the HPI above.  All other systems are negative.  Review of Systems  Constitutional: Negative for chills and fever.  Respiratory: Negative for shortness of breath and wheezing.   Cardiovascular: Negative for chest pain and palpitations.  Gastrointestinal: Negative for abdominal pain.  Neurological: Negative for headaches.  Psychiatric/Behavioral: Negative for behavioral problems and confusion.    Vital Signs: BP 133/68    Pulse 64    Temp 98.4 F (36.9 C) (Axillary)    Resp 16    Ht 5\' 11"  (1.803 m)    Wt (!) 418 lb 6.9 oz (189.8 kg)    SpO2 92%    BMI 58.36 kg/m   Physical Exam Vitals and nursing note reviewed.  Constitutional:      General: He is not in acute distress.    Appearance: Normal appearance.  Cardiovascular:     Rate and Rhythm: Normal rate and regular rhythm.     Heart sounds: Normal heart sounds. No murmur.  Pulmonary:     Effort: Pulmonary effort is normal. No respiratory distress.     Breath sounds: Normal breath sounds. No wheezing.  Skin:    General: Skin is warm and  dry.  Neurological:     Mental Status: He is alert and oriented to person, place, and time.      MD Evaluation Airway: WNL Heart: WNL Abdomen: WNL Chest/ Lungs: WNL ASA  Classification: 3 Mallampati/Airway Score: Two   Imaging: CT HEAD WO CONTRAST  Result Date: 09/18/2019 CLINICAL DATA:  Follow-up subarachnoid hemorrhage EXAM: CT HEAD WITHOUT CONTRAST TECHNIQUE: Contiguous axial images were obtained from the base of the skull through the vertex without intravenous contrast. COMPARISON:  Head CT from yesterday and 06/28/2016 FINDINGS: Brain: Slight interval redistribution of small volume intraventricular hemorrhage seen at the fourth, third, and lateral ventricles. Potentially parenchymal high-density at the roof of the fourth ventricle. Fourth ventricular hemorrhage was seen in 2018 as well. Lytic and low-density skull base mass with well-defined margins and very low-density. 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 No evidence of infarct.  No hydrocephalus. Vascular: Negative by noncontrast CT Skull: Tumoral skull base erosion centered at the  sella. Sinuses/Orbits: Negative IMPRESSION: 1. Allowing for redistribution, no convincing increase in the intraventricular hemorrhage. No hydrocephalus. 2. Fourth ventricular hemorrhage was also seen in 2018, recommend updated MRI/MRA to evaluate for underlying lesion. 3. Known sellar mass with skull base erosion. Electronically Signed   By: Monte Fantasia M.D.   On: 09/18/2019 04:36   CT HEAD WO CONTRAST  Result Date: 09/17/2019 CLINICAL DATA:  Headache. 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 EXAM: CT HEAD WITHOUT CONTRAST TECHNIQUE: Contiguous axial images were obtained from the base of the skull through the vertex without intravenous contrast. COMPARISON:  Brain MR 06/28/2016.  CT 06/28/2016. FINDINGS: Brain: Previously identified infiltrative mass lesion at the central skull base appears to have at least  been partially resected since previous. Evaluation for residual tumor limited on this noncontrast CT examination. Small amount of hyperdensity seen within the fourth ventricle, suspicious for acute intraventricular hemorrhage (series 3, image 11). This measures up to 1 cm in maximal diameter. Overall ventricular size and morphology is fairly stable without obstructive hydrocephalus or trapping. No other acute intracranial hemorrhage. No acute large vessel territory infarct. No other mass lesion, mass effect, or midline shift. No extra-axial fluid collection. Vascular: No hyperdense vessel. Scattered vascular calcifications noted within the carotid siphons. Skull: Scalp soft tissues demonstrate no acute abnormality. Calvarium intact. Sinuses/Orbits: Globes and orbital soft tissues within normal limits. Small left maxillary sinus retention cyst noted. Paranasal sinuses are otherwise largely clear. Chronic right mastoid and middle ear effusion noted. IMPRESSION: 1. Small volume acute intraventricular hemorrhage involving the fourth ventricle. No evidence for obstructive hydrocephalus. 2. Suspected at least partial interval resection of previously identified infiltrative mass lesion at the central skull base. Evaluation for residual tumor limited on this noncontrast CT examination. 3. No other acute intracranial abnormality. 4. Chronic right mastoid and middle ear effusion. Critical Value/emergent results were called by telephone at the time of interpretation on 09/17/2019 at 7:38 pm to provider [facility] , who verbally acknowledged these results. Electronically Signed   By: Jeannine Boga M.D.   On: 09/17/2019 19:39    Labs:  CBC: Recent Labs    09/17/19 1619  WBC 6.6  HGB 13.2  HCT 40.0  PLT 220    COAGS: No results for input(s): INR, APTT in the last 8760 hours.  BMP: Recent Labs    09/17/19 1619  NA 141  K 4.0  CL 103  CO2 27  GLUCOSE 116*  BUN 12  CALCIUM 9.5  CREATININE 0.94    GFRNONAA >60  GFRAA >60    LIVER FUNCTION TESTS:  Assessment and Plan:  IVH (nontraumatic), without etiology. Plan for image-guided diagnostic cerebral arteriogram tentatively for today in IR with Dr. Karenann Cai. Patient is NPO. Afebrile. He does not take blood thinners. INR pending.  Risks and benefits of diagnostic cerebral arteriogram were discussed with the patient including, but not limited to bleeding, infection, vascular injury, stroke, or contrast induced renal failure. This interventional procedure involves the use of X-rays and because of the nature of the planned procedure, it is possible that we will have prolonged use of X-ray fluoroscopy. Potential radiation risks to you include (but are not limited to) the following: - A slightly elevated risk for cancer  several years later in life. This risk is typically less than 0.5% percent. This risk is low in comparison to the normal incidence of human cancer, which is 33% for women and 50% for men according to the [employer]. -  Radiation induced injury can include skin redness, resembling a rash, tissue breakdown / ulcers and hair loss (which can be temporary or permanent).  The likelihood of either of these occurring depends on the difficulty of the procedure and whether you are sensitive to radiation due to previous procedures, disease, or genetic conditions.  IF your procedure requires a prolonged use of radiation, you will be notified and given written instructions for further action.  It is your responsibility to monitor the irradiated area for the 2 weeks following the procedure and to notify your physician if you are concerned that you have suffered a radiation induced injury.   All of the patient's questions were answered, patient is agreeable to proceed. Consent signed and in IR control room.   Thank you for this interesting consult.  I greatly enjoyed meeting Andrew Perez and look forward to  participating in their care.  A copy of this report was sent to the requesting provider on this date.  Electronically Signed: Earley Abide, PA-C 09/18/2019, 9:40 AM   I spent a total of 40 Minutes in face to face in clinical consultation, greater than 50% of which was counseling/coordinating care for IVH/diagnostic cerebral arteriogram.

## 2019-09-19 ENCOUNTER — Inpatient Hospital Stay (HOSPITAL_COMMUNITY): Payer: 59 | Admitting: Certified Registered Nurse Anesthetist

## 2019-09-19 ENCOUNTER — Other Ambulatory Visit: Payer: Self-pay | Admitting: Interventional Radiology

## 2019-09-19 ENCOUNTER — Inpatient Hospital Stay (HOSPITAL_COMMUNITY): Payer: 59

## 2019-09-19 ENCOUNTER — Encounter (HOSPITAL_COMMUNITY): Payer: Self-pay | Admitting: Student in an Organized Health Care Education/Training Program

## 2019-09-19 ENCOUNTER — Encounter (HOSPITAL_COMMUNITY)
Admission: EM | Disposition: A | Discharge status: Rehabilitation Facility | Payer: Self-pay | Source: Home / Self Care | Attending: Neurology

## 2019-09-19 DIAGNOSIS — I639 Cerebral infarction, unspecified: Secondary | ICD-10-CM

## 2019-09-19 HISTORY — PX: IR NEURO EACH ADD'L AFTER BASIC UNI RIGHT (MS): IMG5374

## 2019-09-19 HISTORY — PX: IR ANGIOGRAM FOLLOW UP STUDY: IMG697

## 2019-09-19 HISTORY — PX: IR TRANSCATH/EMBOLIZ: IMG695

## 2019-09-19 HISTORY — PX: IR US GUIDE VASC ACCESS RIGHT: IMG2390

## 2019-09-19 HISTORY — PX: IR ANGIO VERTEBRAL SEL VERTEBRAL UNI R MOD SED: IMG5368

## 2019-09-19 HISTORY — PX: IR 3D INDEPENDENT WKST: IMG2385

## 2019-09-19 HISTORY — PX: IR CT HEAD LTD: IMG2386

## 2019-09-19 HISTORY — PX: RADIOLOGY WITH ANESTHESIA: SHX6223

## 2019-09-19 LAB — PROTIME-INR
INR: 1 (ref 0.8–1.2)
Prothrombin Time: 13.2 seconds (ref 11.4–15.2)

## 2019-09-19 LAB — BASIC METABOLIC PANEL
Anion gap: 7 (ref 5–15)
BUN: 14 mg/dL (ref 6–20)
CO2: 26 mmol/L (ref 22–32)
Calcium: 9.1 mg/dL (ref 8.9–10.3)
Chloride: 108 mmol/L (ref 98–111)
Creatinine, Ser: 0.94 mg/dL (ref 0.61–1.24)
GFR calc Af Amer: >60 mL/min (ref >60)
GFR calc non Af Amer: >60 mL/min (ref >60)
Glucose, Bld: 97 mg/dL (ref 70–99)
Potassium: 4.1 mmol/L (ref 3.5–5.1)
Sodium: 141 mmol/L (ref 135–145)

## 2019-09-19 LAB — HEPARIN LEVEL (UNFRACTIONATED): Heparin Unfractionated: <0.1 IU/mL — ABNORMAL LOW (ref 0.30–0.70)

## 2019-09-19 LAB — CBC
HCT: 36.2 % — ABNORMAL LOW (ref 39.0–52.0)
Hemoglobin: 11.7 g/dL — ABNORMAL LOW (ref 13.0–17.0)
MCH: 29.3 pg (ref 26.0–34.0)
MCHC: 32.3 g/dL (ref 30.0–36.0)
MCV: 90.5 fL (ref 80.0–100.0)
Platelets: 216 10*3/uL (ref 150–400)
RBC: 4 MIL/uL — ABNORMAL LOW (ref 4.22–5.81)
RDW: 14.9 % (ref 11.5–15.5)
WBC: 7.4 10*3/uL (ref 4.0–10.5)
nRBC: 0 % (ref 0.0–0.2)

## 2019-09-19 LAB — LIPID PANEL
Cholesterol: 159 mg/dL (ref 0–200)
HDL: 35 mg/dL — ABNORMAL LOW (ref >40)
LDL Cholesterol: 104 mg/dL — ABNORMAL HIGH (ref 0–99)
Total CHOL/HDL Ratio: 4.5 RATIO
Triglycerides: 100 mg/dL (ref <150)
VLDL: 20 mg/dL (ref 0–40)

## 2019-09-19 LAB — HIV ANTIBODY (ROUTINE TESTING W REFLEX): HIV Screen 4th Generation wRfx: NONREACTIVE

## 2019-09-19 LAB — POCT ACTIVATED CLOTTING TIME
Activated Clotting Time: 125 seconds
Activated Clotting Time: 153 seconds
Activated Clotting Time: 164 seconds

## 2019-09-19 SURGERY — RADIOLOGY WITH ANESTHESIA
Anesthesia: General

## 2019-09-19 MED ORDER — ONDANSETRON HCL 4 MG/2ML IJ SOLN
INTRAMUSCULAR | Status: AC
  Filled 2019-09-19: qty 2

## 2019-09-19 MED ORDER — FENTANYL CITRATE (PF) 100 MCG/2ML IJ SOLN
INTRAMUSCULAR | Status: DC | PRN
  Administered 2019-09-19: 150 ug via INTRAVENOUS

## 2019-09-19 MED ORDER — NIMODIPINE 30 MG PO CAPS: 0.0000 mg | ORAL_CAPSULE | ORAL | Status: DC

## 2019-09-19 MED ORDER — GLYCOPYRROLATE 0.2 MG/ML IJ SOLN
INTRAMUSCULAR | Status: DC | PRN
Start: 2019-09-19 — End: 2019-09-19
  Administered 2019-09-19: .2 mg via INTRAVENOUS

## 2019-09-19 MED ORDER — IOHEXOL 240 MG/ML SOLN: 150.0000 mL | Freq: Once | INTRAMUSCULAR | Status: DC | PRN

## 2019-09-19 MED ORDER — LABETALOL HCL 5 MG/ML IV SOLN
10.0000 mg | Freq: Once | INTRAVENOUS | Status: AC
  Administered 2019-09-19: 10 mg via INTRAVENOUS

## 2019-09-19 MED ORDER — PROPOFOL 10 MG/ML IV BOLUS
INTRAVENOUS | Status: DC | PRN
  Administered 2019-09-19: 200 mg via INTRAVENOUS

## 2019-09-19 MED ORDER — HEPARIN (PORCINE) 25000 UT/250ML-% IV SOLN
1050.0000 [IU]/h | INTRAVENOUS | Status: DC
  Administered 2019-09-19: 900 [IU]/h via INTRAVENOUS
  Filled 2019-09-19 (×2): qty 250

## 2019-09-19 MED ORDER — FENTANYL CITRATE (PF) 250 MCG/5ML IJ SOLN
INTRAMUSCULAR | Status: AC
  Filled 2019-09-19: qty 5

## 2019-09-19 MED ORDER — LABETALOL HCL 5 MG/ML IV SOLN
INTRAVENOUS | Status: AC
  Filled 2019-09-19: qty 4

## 2019-09-19 MED ORDER — SODIUM CHLORIDE 0.9 % IV SOLN: INTRAVENOUS | Status: DC

## 2019-09-19 MED ORDER — VERAPAMIL HCL 2.5 MG/ML IV SOLN
INTRAVENOUS | Status: AC
  Filled 2019-09-19: qty 2

## 2019-09-19 MED ORDER — CHLORHEXIDINE GLUCONATE 0.12 % MT SOLN
15.0000 mL | Freq: Once | OROMUCOSAL | Status: AC
  Administered 2019-09-19: 15 mL via OROMUCOSAL
  Filled 2019-09-19: qty 15

## 2019-09-19 MED ORDER — DEXTROSE 5 % IV SOLN
3.0000 g | INTRAVENOUS | Status: AC
  Administered 2019-09-19: 3 g via INTRAVENOUS
  Filled 2019-09-19: qty 3000

## 2019-09-19 MED ORDER — HYDRALAZINE HCL 20 MG/ML IJ SOLN
10.0000 mg | INTRAMUSCULAR | Status: DC | PRN
  Administered 2019-09-19 – 2019-09-21 (×5): 10 mg via INTRAVENOUS
  Filled 2019-09-19 (×5): qty 1

## 2019-09-19 MED ORDER — NITROGLYCERIN 1 MG/10 ML FOR IR/CATH LAB
INTRA_ARTERIAL | Status: AC | PRN
  Administered 2019-09-19: 300 ug via INTRA_ARTERIAL

## 2019-09-19 MED ORDER — ALBUMIN HUMAN 25 % IV SOLN: INTRAVENOUS | Status: DC | PRN

## 2019-09-19 MED ORDER — IOHEXOL 240 MG/ML SOLN
INTRAMUSCULAR | Status: AC
  Administered 2019-09-19: 40 mL via INTRA_ARTERIAL
  Filled 2019-09-19: qty 100

## 2019-09-19 MED ORDER — ONDANSETRON HCL 4 MG/2ML IJ SOLN
INTRAMUSCULAR | Status: DC | PRN
  Administered 2019-09-19: 4 mg via INTRAVENOUS

## 2019-09-19 MED ORDER — VERAPAMIL HCL 2.5 MG/ML IV SOLN
INTRAVENOUS | Status: AC | PRN
  Administered 2019-09-19 (×2): 5 mg via INTRA_ARTERIAL

## 2019-09-19 MED ORDER — HEPARIN SODIUM (PORCINE) 1000 UNIT/ML IJ SOLN
INTRAMUSCULAR | Status: DC | PRN
  Administered 2019-09-19: 2000 [IU] via INTRAVENOUS

## 2019-09-19 MED ORDER — ONDANSETRON HCL 4 MG/2ML IJ SOLN
4.0000 mg | Freq: Once | INTRAMUSCULAR | Status: AC | PRN
  Administered 2019-09-19: 4 mg via INTRAVENOUS

## 2019-09-19 MED ORDER — ROCURONIUM BROMIDE 10 MG/ML (PF) SYRINGE
PREFILLED_SYRINGE | INTRAVENOUS | Status: DC | PRN
  Administered 2019-09-19: 40 mg via INTRAVENOUS
  Administered 2019-09-19 (×3): 20 mg via INTRAVENOUS
  Administered 2019-09-19: 60 mg via INTRAVENOUS

## 2019-09-19 MED ORDER — FENTANYL CITRATE (PF) 100 MCG/2ML IJ SOLN: 25.0000 ug | INTRAMUSCULAR | Status: DC | PRN

## 2019-09-19 MED ORDER — LIDOCAINE HCL 1 % IJ SOLN
INTRAMUSCULAR | Status: AC
  Filled 2019-09-19: qty 20

## 2019-09-19 MED ORDER — DEXAMETHASONE SODIUM PHOSPHATE 10 MG/ML IJ SOLN
INTRAMUSCULAR | Status: DC | PRN
  Administered 2019-09-19: 4 mg via INTRAVENOUS

## 2019-09-19 MED ORDER — SUGAMMADEX SODIUM 200 MG/2ML IV SOLN
INTRAVENOUS | Status: DC | PRN
  Administered 2019-09-19: 379.6 mg via INTRAVENOUS

## 2019-09-19 MED ORDER — CLEVIDIPINE BUTYRATE 0.5 MG/ML IV EMUL
0.0000 mg/h | INTRAVENOUS | Status: DC
  Administered 2019-09-19: 21 mg/h via INTRAVENOUS
  Administered 2019-09-19: 8 mg/h via INTRAVENOUS
  Administered 2019-09-19: 1 mg/h via INTRAVENOUS
  Administered 2019-09-19 – 2019-09-20 (×2): 21 mg/h via INTRAVENOUS
  Administered 2019-09-20 (×5): 32 mg/h via INTRAVENOUS
  Administered 2019-09-20: 28 mg/h via INTRAVENOUS
  Administered 2019-09-20: 30 mg/h via INTRAVENOUS
  Administered 2019-09-20: 32 mg/h via INTRAVENOUS
  Administered 2019-09-20: 30 mg/h via INTRAVENOUS
  Administered 2019-09-20: 28 mg/h via INTRAVENOUS
  Administered 2019-09-20 – 2019-09-21 (×6): 32 mg/h via INTRAVENOUS
  Administered 2019-09-21: 30 mg/h via INTRAVENOUS
  Administered 2019-09-21: 22 mg/h via INTRAVENOUS
  Administered 2019-09-21 (×4): 32 mg/h via INTRAVENOUS
  Administered 2019-09-21: 30 mg/h via INTRAVENOUS
  Administered 2019-09-22 (×2): 32 mg/h via INTRAVENOUS
  Administered 2019-09-22: 20 mg/h via INTRAVENOUS
  Administered 2019-09-22: 32 mg/h via INTRAVENOUS
  Administered 2019-09-22: 28 mg/h via INTRAVENOUS
  Administered 2019-09-22: 18 mg/h via INTRAVENOUS
  Administered 2019-09-22: 32 mg/h via INTRAVENOUS
  Administered 2019-09-22: 24 mg/h via INTRAVENOUS
  Administered 2019-09-22: 32 mg/h via INTRAVENOUS
  Administered 2019-09-22: 20 mg/h via INTRAVENOUS
  Administered 2019-09-22 (×3): 32 mg/h via INTRAVENOUS
  Administered 2019-09-23: 20 mg/h via INTRAVENOUS
  Administered 2019-09-23: 24 mg/h via INTRAVENOUS
  Administered 2019-09-24: 26 mg/h via INTRAVENOUS
  Administered 2019-09-24: 2 mg/h via INTRAVENOUS
  Administered 2019-09-24: 26 mg/h via INTRAVENOUS
  Administered 2019-09-24: 32 mg/h via INTRAVENOUS
  Administered 2019-09-24: 20 mg/h via INTRAVENOUS
  Administered 2019-09-24: 32 mg/h via INTRAVENOUS
  Administered 2019-09-25: 6 mg/h via INTRAVENOUS
  Administered 2019-09-25 (×2): 10 mg/h via INTRAVENOUS
  Administered 2019-09-25 (×2): 16 mg/h via INTRAVENOUS
  Administered 2019-09-26: 7 mg/h via INTRAVENOUS
  Filled 2019-09-19: qty 50
  Filled 2019-09-19: qty 100
  Filled 2019-09-19 (×3): qty 50
  Filled 2019-09-19: qty 100
  Filled 2019-09-19: qty 150
  Filled 2019-09-19: qty 100
  Filled 2019-09-19: qty 150
  Filled 2019-09-19 (×3): qty 50
  Filled 2019-09-19: qty 100
  Filled 2019-09-19 (×2): qty 50
  Filled 2019-09-19 (×2): qty 100
  Filled 2019-09-19 (×11): qty 50
  Filled 2019-09-19: qty 100
  Filled 2019-09-19 (×3): qty 50
  Filled 2019-09-19: qty 100
  Filled 2019-09-19: qty 50
  Filled 2019-09-19 (×2): qty 150
  Filled 2019-09-19 (×4): qty 100
  Filled 2019-09-19: qty 50

## 2019-09-19 MED ORDER — VERAPAMIL HCL 2.5 MG/ML IV SOLN
INTRAVENOUS | Status: AC | PRN
  Administered 2019-09-19: 5 mg via INTRAVENOUS

## 2019-09-19 MED ORDER — HEPARIN (PORCINE) 25000 UT/250ML-% IV SOLN
INTRAVENOUS | Status: AC
  Filled 2019-09-19: qty 250

## 2019-09-19 MED ORDER — HEPARIN SODIUM (PORCINE) 1000 UNIT/ML IJ SOLN
INTRAMUSCULAR | Status: AC
  Filled 2019-09-19: qty 1

## 2019-09-19 MED ORDER — SUCCINYLCHOLINE CHLORIDE 200 MG/10ML IV SOSY
PREFILLED_SYRINGE | INTRAVENOUS | Status: DC | PRN
  Administered 2019-09-19: 160 mg via INTRAVENOUS

## 2019-09-19 MED ORDER — NITROGLYCERIN 1 MG/10 ML FOR IR/CATH LAB
INTRA_ARTERIAL | Status: AC
  Administered 2019-09-19: 200 ug
  Filled 2019-09-19: qty 10

## 2019-09-19 MED ORDER — LIDOCAINE 2% (20 MG/ML) 5 ML SYRINGE
INTRAMUSCULAR | Status: DC | PRN
  Administered 2019-09-19: 80 mg via INTRAVENOUS

## 2019-09-19 MED ORDER — ORAL CARE MOUTH RINSE: 15.0000 mL | Freq: Once | OROMUCOSAL | Status: AC

## 2019-09-19 MED ORDER — PHENYLEPHRINE HCL-NACL 10-0.9 MG/250ML-% IV SOLN
INTRAVENOUS | Status: DC | PRN
  Administered 2019-09-19: 25 ug/min via INTRAVENOUS

## 2019-09-19 MED ORDER — HEPARIN SODIUM (PORCINE) 1000 UNIT/ML IJ SOLN
INTRAMUSCULAR | Status: AC | PRN
  Administered 2019-09-19: 5000 [IU] via INTRAVENOUS

## 2019-09-19 MED ORDER — LIDOCAINE HCL 1 % IJ SOLN
INTRAMUSCULAR | Status: AC | PRN
  Administered 2019-09-19: 1 mL

## 2019-09-19 MED ORDER — ALBUMIN HUMAN 25 % IV SOLN
INTRAVENOUS | Status: AC
  Filled 2019-09-19: qty 200

## 2019-09-19 NOTE — Progress Notes (Signed)
ANTICOAGULATION CONSULT NOTE  Pharmacy Consult for Heparin Indication: s/p aneurysm coiling  Allergies  Allergen Reactions  . Codeine Shortness Of Breath  . Shellfish Allergy Shortness Of Breath    Patient Measurements: Height: 5\' 11"  (180.3 cm) Weight: (!) 189.8 kg (418 lb 6.9 oz) IBW/kg (Calculated) : 75.3 Heparin Dosing Weight: 121 kg  Vital Signs: Temp: 97.5 F (36.4 C) (06/03 2000) Temp Source: Axillary (06/03 2000) BP: 170/63 (06/03 2045) Pulse Rate: 63 (06/03 2200)  Labs: Recent Labs    09/17/19 1619 09/19/19 0515 09/19/19 2100  HGB 13.2 11.7*  --   HCT 40.0 36.2*  --   PLT 220 216  --   LABPROT  --  13.2  --   INR  --  1.0  --   HEPARINUNFRC  --   --  <0.10*  CREATININE 0.94 0.94  --     Estimated Creatinine Clearance: 155.7 mL/min (by C-G formula based on SCr of 0.94 mg/dL).   Medical History: Past Medical History:  Diagnosis Date  . Hypertension   . Sleep apnea     Assessment: 62 YOM admitted on 6/2 with headache and found to have a recurrent small IVH in the 4th ventricle. 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 Heparin consult to start low-dose Heparin post-IR.   The IR-MD (de Sindy Messing) is aware of the concurrent IVH and to monitor for bleeding with Heparin and obtain a stat CT for any clinical deterioration. Will aim for low goal s/p IR of 0.1-0.25.  Initial heparin level undetectable, no S/Sx bleeding per RN.  Goal of Therapy:  Heparin level 0.1-0.25 units/ml Monitor platelets by anticoagulation protocol: Yes   Plan:  -Increase heparin to 1050 units/h -Recheck heparin level in 6h   Arrie Senate, PharmD, BCPS Clinical Pharmacist [phone] Please check AMION for all [place] numbers 09/19/2019

## 2019-09-19 NOTE — Transfer of Care (Signed)
Immediate Anesthesia Transfer of Care Note  Patient: RONIN FARB  Procedure(s) Performed: RADIOLOGY WITH ANESTHESIA ANUERYSM COILING (N/A )  Patient Location: PACU  Anesthesia Type:General  Level of Consciousness: drowsy and patient cooperative  Airway & Oxygen Therapy: Patient Spontanous Breathing and Patient connected to face mask oxygen  Post-op Assessment: Report given to RN and Post -op Vital signs reviewed and stable  Post vital signs: Reviewed and stable  Last Vitals:  Vitals Value Taken Time  BP 141/60   Temp    Pulse 49 09/19/19 1431  Resp 10 09/19/19 1431  SpO2 97 % 09/19/19 1431  Vitals shown include unvalidated device data.  Last Pain:  Vitals:   09/19/19 0800  TempSrc:   PainSc: 0-No pain      Patients Stated Pain Goal: 0 ([id number])  Complications: No apparent anesthesia complications

## 2019-09-19 NOTE — Progress Notes (Signed)
Alfred Levins, PA if Nimotop was needed.  PA called MD for further orders. MD did not want patient to have Nimotop at this time.

## 2019-09-19 NOTE — Progress Notes (Signed)
SLP Cancellation Note  Patient Details Name: IBRAHEM WIECEK MRN: [id number] DOB: 1965-08-11   Cancelled treatment:       Reason Eval/Treat Not Completed: Patient at procedure or test/unavailable     Osie Bond., M.A. [place] Acute Rehabilitation Services Pager [phone] Office [phone]  09/19/2019, 9:01 AM

## 2019-09-19 NOTE — Progress Notes (Signed)
ANTICOAGULATION CONSULT NOTE - Initial Consult  Pharmacy Consult for Heparin Indication: s/p aneurysm coiling  Allergies  Allergen Reactions  . Codeine Shortness Of Breath  . Shellfish Allergy Shortness Of Breath    Patient Measurements: Height: 5\' 11"  (180.3 cm) Weight: (!) 189.8 kg (418 lb 6.9 oz) IBW/kg (Calculated) : 75.3 Heparin Dosing Weight: 121 kg  Vital Signs: Temp: 99.1 F (37.3 C) (06/03 0800) Temp Source: Oral (06/03 0400) BP: 146/88 (06/03 0845) Pulse Rate: 64 (06/03 0845)  Labs: Recent Labs    09/17/19 1619 09/19/19 0515  HGB 13.2 11.7*  HCT 40.0 36.2*  PLT 220 216  LABPROT  --  13.2  INR  --  1.0  CREATININE 0.94 0.94    Estimated Creatinine Clearance: 155.7 mL/min (by C-G formula based on SCr of 0.94 mg/dL).   Medical History: Past Medical History:  Diagnosis Date  . Hypertension   . Sleep apnea     Assessment: 43 YOM admitted on 6/2 with headache and found to have a recurrent small IVH in the 4th ventricle. 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 Heparin consult to start low-dose Heparin post-IR.   The IR-MD (de Sindy Messing) is aware of the concurrent IVH and to monitor for bleeding with Heparin and obtain a stat CT for any clinical deterioration. Will aim for low goal s/p IR of 0.1-0.25 and   Goal of Therapy:  Heparin level 0.1-0.25 units/ml Monitor platelets by anticoagulation protocol: Yes   Plan:  - Start Heparin at 900 units/hr (~8 units/kg/hr of heparin dosing weight) - Will aim for low goal of 0.1-0.25 s/p IR - Will continue to monitor for any signs/symptoms of bleeding and will follow up with heparin level in 6 hours   Thank you for allowing pharmacy to be a part of this patient's care.  Alycia Rossetti, PharmD, BCPS Clinical Pharmacist Clinical phone for 09/19/2019: [phone] 09/19/2019 2:56 PM   **Pharmacist phone directory can now be found on [email or web address] ([email or web address]).  Listed under  [place].

## 2019-09-19 NOTE — Progress Notes (Signed)
PT Cancellation Note  Patient Details Name: Andrew Perez MRN: [id number] DOB: [birth year]   Cancelled Treatment:    Reason Eval/Treat Not Completed: Patient at procedure or test/unavailable. Pt off unit for aneurysm clipping. PT will follow up when pt is medically stable and able to participate in skilled PT intervention.   Zenaida Niece 09/19/2019, 3:39 PM

## 2019-09-19 NOTE — Progress Notes (Signed)
iStat ACT value is 125

## 2019-09-19 NOTE — Anesthesia Preprocedure Evaluation (Addendum)
Anesthesia Evaluation  Patient identified by MRN, date of birth, ID band Patient awake    Reviewed: Allergy & Precautions, NPO status , Patient's Chart, lab work & pertinent test results, reviewed documented beta blocker date and time   Airway Mallampati: IV  TM Distance: >3 FB Neck ROM: Limited    Dental no notable dental hx. (+) Teeth Intact, Caps   Pulmonary sleep apnea and Continuous Positive Airway Pressure Ventilation ,    Pulmonary exam normal breath sounds clear to auscultation       Cardiovascular hypertension, Pt. on medications Normal cardiovascular exam Rhythm:Regular Rate:Normal     Neuro/Psych Hx/o pituitary tumor Cerebral aneurysm negative psych ROS   GI/Hepatic negative GI ROS, Neg liver ROS,   Endo/Other  Morbid obesityHyperlipidemia  Renal/GU negative Renal ROS  negative genitourinary   Musculoskeletal negative musculoskeletal ROS (+)   Abdominal (+) + obese,   Peds  Hematology   Anesthesia Other Findings   Reproductive/Obstetrics                             Anesthesia Physical Anesthesia Plan  ASA: III  Anesthesia Plan: General   Post-op Pain Management:    Induction: Intravenous  PONV Risk Score and Plan: 3 and Ondansetron, Dexamethasone, Treatment may vary due to age or medical condition and Midazolam  Airway Management Planned: Oral ETT  Additional Equipment:   Intra-op Plan:   Post-operative Plan: Extubation in OR  Informed Consent: I have reviewed the patients History and Physical, chart, labs and discussed the procedure including the risks, benefits and alternatives for the proposed anesthesia with the patient or authorized representative who has indicated his/her understanding and acceptance.     Dental advisory given  Plan Discussed with: CRNA and Surgeon  Anesthesia Plan Comments:         Anesthesia Quick Evaluation

## 2019-09-19 NOTE — Procedures (Signed)
INTERVENTIONAL NEURORADIOLOGY BRIEF POSTPROCEDURE NOTE  DIAGNOSTIC CEREBRAL ANGIOGRAM  ENDOVASCULAR EMBOLIZATION OF RIGHT PICA ANEURYSM  Attending: Dr. Pedro Earls  Assistant: None  Diagnosis: Dissecting right PICA aneurysm  Access site: Right radial artery  Access closure: Inflatable band  Anesthesia: General  Medication used: Refer to anesthesia documentation.  Complications: Right PICA occlusion  Estimated blood loss: Minimal  Specimen: None  Findings: There was a 5 mm dissecting aneurysm from a small branch off of the telovelotonsillar segment of the right PICA. Embolization performed with Onyx 18 with occlusion of the branch vessel. Reflux into the PICA at this level noted. Follow-up angiogram showed occlusion of the PICA at the telovelotonsillar level.   PLAN: maintain SBP 130-160 mmHg. Low dose heparin drip. STAT head CT for any clinical deterioration.

## 2019-09-19 NOTE — Progress Notes (Signed)
iStat ACT value is 153 at 12:00  iStat ACT value is 164 at 12:23

## 2019-09-19 NOTE — Progress Notes (Signed)
STROKE TEAM PROGRESS NOTE   INTERVAL HISTORY Wife and RN at bedside. Pt sleepy but easily arousable. Orientated x 3. Denies HA or dizziness. However, right gaze nystagmus noted. No ataxia. On heparin and cleviprix. Will put on IVF   Vitals:   09/19/19 0840 09/19/19 0842 09/19/19 0845 09/19/19 0859  BP: (!) 147/78 125/75 (!) 146/88   Pulse: (!) 56 (!) 58 64   Resp: 13 16 19    Temp:      TempSrc:      SpO2: 95% 96% 96%   Weight:    (!) 189.8 kg  Height:    5\' 11"  (1.803 m)   CBC:  Recent Labs  Lab 09/17/19 1619 09/19/19 0515  WBC 6.6 7.4  HGB 13.2 11.7*  HCT 40.0 36.2*  MCV 88.9 90.5  PLT 220 [id number]   Basic Metabolic Panel:  Recent Labs  Lab 09/17/19 1619 09/19/19 0515  NA 141 141  K 4.0 4.1  CL 103 108  CO2 27 26  GLUCOSE 116* 97  BUN 12 14  CREATININE 0.94 0.94  CALCIUM 9.5 9.1   Lipid Panel:     Component Value Date/Time   CHOL 159 09/19/2019 0515   TRIG 100 09/19/2019 0515   HDL 35 (L) 09/19/2019 0515   CHOLHDL 4.5 09/19/2019 0515   VLDL 20 09/19/2019 0515   LDLCALC 104 (H) 09/19/2019 0515   HgbA1c:  Lab Results  Component Value Date   HGBA1C 4.7 (L) 09/18/2019   Urine Drug Screen:     Component Value Date/Time   LABOPIA POSITIVE (A) 09/17/2019 2126   COCAINSCRNUR NONE DETECTED 09/17/2019 2126   LABBENZ NONE DETECTED 09/17/2019 2126   AMPHETMU NONE DETECTED 09/17/2019 2126   THCU NONE DETECTED 09/17/2019 2126   LABBARB NONE DETECTED 09/17/2019 2126    Alcohol Level No results found for: [facility]  IMAGING past 24 hours ECHOCARDIOGRAM COMPLETE  Result Date: 09/18/2019    ECHOCARDIOGRAM REPORT   Patient Name:   Andrew Perez Date of Exam: 09/18/2019 Medical Rec #:  [id number]     Height:       71.0 in Accession #:    [id number]    Weight:       418.4 lb Date of Birth:  1965/07/02     BSA:          2.886 m Patient Age:    54 years      BP:           124/54 mmHg Patient Gender: M             HR:           62 bpm. Exam Location:  Inpatient Procedure: 2D Echo,  Color Doppler, Cardiac Doppler and Intracardiac            Opacification Agent Indications:    Stroke i163.9  History:        Patient has prior history of Echocardiogram examinations, most                 recent 06/28/2016. Risk Factors:Hypertension, Dyslipidemia and                 Sleep Apnea.  Sonographer:    Raquel Sarna Senior RDCS Referring Phys: [id number] ASHISH ARORA  Sonographer Comments: Technically difficult study due to patient body habitus and poor echo windows. IMPRESSIONS  1. Left ventricular ejection fraction, by estimation, is 55 to 60%. The left ventricle has normal function. The left ventricle has no regional wall  motion abnormalities. There is moderate left ventricular hypertrophy. Left ventricular diastolic parameters are consistent with Grade II diastolic dysfunction (pseudonormalization).  2. Right ventricular systolic function is normal. The right ventricular size is mildly enlarged. Tricuspid regurgitation signal is inadequate for assessing PA pressure.  3. Left atrial size was moderately dilated.  4. Right atrial size was mildly dilated.  5. The mitral valve is normal in structure. No evidence of mitral valve regurgitation. No evidence of mitral stenosis.  6. The aortic valve is tricuspid. Aortic valve regurgitation is not visualized. No aortic stenosis is present.  7. Aortic dilatation noted. There is mild dilatation of the ascending aorta measuring 39 mm.  8. The inferior vena cava is dilated in size with <50% respiratory variability, suggesting right atrial pressure of 15 mmHg.  9. Technically difficult study with poor acoustic windows. FINDINGS  Left Ventricle: Left ventricular ejection fraction, by estimation, is 55 to 60%. The left ventricle has normal function. The left ventricle has no regional wall motion abnormalities. Definity contrast agent was given IV to delineate the left ventricular  endocardial borders. The left ventricular internal cavity size was normal in size. There is moderate  left ventricular hypertrophy. Left ventricular diastolic parameters are consistent with Grade II diastolic dysfunction (pseudonormalization). Right Ventricle: The right ventricular size is mildly enlarged. No increase in right ventricular wall thickness. Right ventricular systolic function is normal. Tricuspid regurgitation signal is inadequate for assessing PA pressure. Left Atrium: Left atrial size was moderately dilated. Right Atrium: Right atrial size was mildly dilated. Pericardium: There is no evidence of pericardial effusion. Mitral Valve: The mitral valve is normal in structure. No evidence of mitral valve regurgitation. No evidence of mitral valve stenosis. Tricuspid Valve: The tricuspid valve is normal in structure. Tricuspid valve regurgitation is not demonstrated. Aortic Valve: The aortic valve is tricuspid. Aortic valve regurgitation is not visualized. No aortic stenosis is present. Pulmonic Valve: The pulmonic valve was normal in structure. Pulmonic valve regurgitation is not visualized. Aorta: Aortic dilatation noted. There is mild dilatation of the ascending aorta measuring 39 mm. Venous: The inferior vena cava is dilated in size with less than 50% respiratory variability, suggesting right atrial pressure of 15 mmHg. IAS/Shunts: The interatrial septum was not well visualized.  LEFT VENTRICLE PLAX 2D LVIDd:         5.47 cm  Diastology LVIDs:         2.83 cm  LV e' lateral:   7.72 cm/s LV PW:         1.87 cm  LV E/e' lateral: 14.6 LV IVS:        1.67 cm  LV e' medial:    7.40 cm/s LVOT diam:     2.30 cm  LV E/e' medial:  15.3 LV SV:         94 LV SV Index:   33 LVOT Area:     4.15 cm  RIGHT VENTRICLE RV S prime:     16.90 cm/s TAPSE (M-mode): 3.2 cm LEFT ATRIUM              Index       RIGHT ATRIUM           Index LA diam:        3.50 cm  1.21 cm/m  RA Area:     25.10 cm LA Vol (A2C):   105.0 ml 36.39 ml/m RA Volume:   78.70 ml  27.27 ml/m LA Vol (A4C):   149.0 ml 51.64 ml/m LA  Biplane Vol: 138.0  ml 47.82 ml/m  AORTIC VALVE LVOT Vmax:   112.00 cm/s LVOT Vmean:  70.000 cm/s LVOT VTI:    0.227 m  AORTA Ao Root diam: 3.60 cm Ao Asc diam:  3.85 cm MITRAL VALVE MV Area (PHT): 2.80 cm     SHUNTS MV Decel Time: 271 msec     Systemic VTI:  0.23 m MV E velocity: 113.00 cm/s  Systemic Diam: 2.30 cm MV A velocity: 112.00 cm/s MV E/A ratio:  1.01 Loralie Champagne MD Electronically signed by Loralie Champagne MD Signature Date/Time: 09/18/2019/4:52:31 PM    Final     PHYSICAL EXAM   Temp:  [97.6 F (36.4 C)-99.1 F (37.3 C)] 99.1 F (37.3 C) (06/03 0800) Pulse Rate:  [49-70] 64 (06/03 0845) Resp:  [12-21] 19 (06/03 0845) BP: (114-161)/(53-88) 146/88 (06/03 0845) SpO2:  [87 %-99 %] 96 % (06/03 0845) Weight:  [189.8 kg] 189.8 kg (06/03 0859)  General - morbid obesity, well developed, sleepy but easily arousable.  Ophthalmologic - fundi not visualized due to noncooperation.  Cardiovascular - Regular rhythm and rate.  Neuro - sleepy but easily arousable, orientated x 3. No aphasia, but paucity of speech, slight dysarthria due to sleepiness. Able to name and repeat. PERRL, left gaze complete without nystagmus, however, right gaze horizontal nystagmus. Mild right facial nasolabial fold flattening. visual field full. Tongue midline. Moving all extremities equally. B/l FTN intact no ataxia. Sensation intact, gait not tested.    ASSESSMENT/PLAN Mr. NAHUN CYPHERT is a 54 y.o. 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 SBP > 190.  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 small volume IVH in 4th  ventricle. Interval resection previous central skull base infiltrative mass. 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 No hydrocephalus. Known sellar mass w/ skull base erosion.   Cerebral angio right PICA 13mm aneurysm   2D Echo EF 55-60%. No source of embolus. LA moderate dilated  LDL 104  HgbA1c 4.7  UDS + opiates  SCDs for VTE prophylaxis  No antithrombotic prior to admission, was on No antithrombotic given hemorrhage. Post aneurysm embolization, now on heparin IV.   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  Carotid Doppler negative.  EF 55 to 60%.  LDL 27 and A1c 4.6.  CTA head at that time showed right PICA patent.  Prolactinoma    followed by Dr. Maurie Boettcher and endocrinologist at [facility]  on cabergoline therapy since March 2018 with decrease in mass size on follow-up imaging  being followed by surveillance with MRIs  Hypertensive Emergency  SBP > 190 on arrival   Home meds:  amlodipine 10, hydralazine 100 q8, losartan 100  Now on  cleviprex  Resume home medication  SBP goal 130-160 now post IR    Long-term BP goal normotensive  Hyperlipidemia  LDL 104  on no statin PTA  Hold statin in setting of hemorrhage  Dysphagia   NPO post procedure  On IVF  Other Stroke Risk Factors  Morbid Obesity, Body mass index is 58.36 kg/m., recommend weight loss, diet and exercise as appropriate   Obstructive sleep apnea  Other Active Problems  Hypothyroid on synthroid  Hospital day # 2  Patient continues to be  critically ill for the last 24 hours, has underwent aneurysm management with right PICA embolization, developed nystagmus and lethargy, likely due to cerebellar infarct, and I continued his heparin IV and kept NPO with IVF, BP management with cleviprex. I discussed with Dr. Norma Fredrickson.   This patient is critically ill due to IVH, hypertensive emergency, history of IVH and at significant risk of neurological worsening, death form recurrent IVH, worsening IVH, hydrocephalus, hypertensive encephalopathy, seizure. This patient's care requires constant monitoring of vital signs, hemodynamics, respiratory and cardiac monitoring, review of multiple databases, neurological assessment, discussion with family, other specialists and medical decision making of high complexity. I spent 35 minutes of neurocritical care time in the care of this patient. I had long discussion with wife at bedside, updated pt current condition, treatment plan and potential prognosis, and answered all the questions.  She expressed understanding and appreciation.    Rosalin Hawking, MD PhD Stroke Neurology 09/19/2019 10:08 AM  To contact Stroke Continuity provider, please refer to [email or web address] After hours, contact General Neurology

## 2019-09-19 NOTE — Progress Notes (Signed)
OT Cancellation Note  Patient Details Name: Andrew Perez MRN: [id number] DOB: 04-19-1965   Cancelled Treatment:    Reason Eval/Treat Not Completed: Patient at procedure or test/ unavailable.  Will reattempt.  Nilsa Nutting., OTR/L Acute Rehabilitation Services Pager [phone] Office [phone]   Lucille Passy M 09/19/2019, 8:55 AM

## 2019-09-19 NOTE — Progress Notes (Signed)
Notified MD BP 180/63 and cleviprex at max.  Increased Cleviprex to 32 per MD order.

## 2019-09-19 NOTE — Anesthesia Procedure Notes (Signed)
Procedure Name: Intubation Date/Time: 09/19/2019 10:25 AM Performed by: Imagene Riches, CRNA Pre-anesthesia Checklist: Patient identified, Emergency Drugs available, Suction available and Patient being monitored Patient Re-evaluated:Patient Re-evaluated prior to induction Oxygen Delivery Method: Circle System Utilized Preoxygenation: Pre-oxygenation with 100% oxygen Induction Type: IV induction Laryngoscope Size: Glidescope and 4 Grade View: Grade I Tube type: Oral Number of attempts: 1 Airway Equipment and Method: Stylet and Oral airway Placement Confirmation: ETT inserted through vocal cords under direct vision,  positive ETCO2 and breath sounds checked- equal and bilateral Secured at: 24 cm Tube secured with: Tape Dental Injury: Teeth and Oropharynx as per pre-operative assessment  Comments: Initial DL by Sharyn Dross, CRNA unable to get blade around patient's tongue. Dr. Royce Macadamia was able to maneuver blade around tongue, obtaining grade 1 view and atraumatic intubation.

## 2019-09-20 ENCOUNTER — Inpatient Hospital Stay (HOSPITAL_COMMUNITY): Payer: 59

## 2019-09-20 ENCOUNTER — Encounter: Payer: Self-pay | Admitting: *Deleted

## 2019-09-20 DIAGNOSIS — E7800 Pure hypercholesterolemia, unspecified: Secondary | ICD-10-CM

## 2019-09-20 DIAGNOSIS — I63543 Cerebral infarction due to unspecified occlusion or stenosis of bilateral cerebellar arteries: Secondary | ICD-10-CM

## 2019-09-20 LAB — CBC
HCT: 35.9 % — ABNORMAL LOW (ref 39.0–52.0)
Hemoglobin: 12 g/dL — ABNORMAL LOW (ref 13.0–17.0)
MCH: 29.8 pg (ref 26.0–34.0)
MCHC: 33.4 g/dL (ref 30.0–36.0)
MCV: 89.1 fL (ref 80.0–100.0)
Platelets: 237 10*3/uL (ref 150–400)
RBC: 4.03 MIL/uL — ABNORMAL LOW (ref 4.22–5.81)
RDW: 14.4 % (ref 11.5–15.5)
WBC: 14.6 10*3/uL — ABNORMAL HIGH (ref 4.0–10.5)
nRBC: 0 % (ref 0.0–0.2)

## 2019-09-20 LAB — BASIC METABOLIC PANEL
Anion gap: 8 (ref 5–15)
BUN: 17 mg/dL (ref 6–20)
CO2: 23 mmol/L (ref 22–32)
Calcium: 8.9 mg/dL (ref 8.9–10.3)
Chloride: 106 mmol/L (ref 98–111)
Creatinine, Ser: 0.88 mg/dL (ref 0.61–1.24)
GFR calc Af Amer: >60 mL/min (ref >60)
GFR calc non Af Amer: >60 mL/min (ref >60)
Glucose, Bld: 136 mg/dL — ABNORMAL HIGH (ref 70–99)
Potassium: 3.8 mmol/L (ref 3.5–5.1)
Sodium: 137 mmol/L (ref 135–145)

## 2019-09-20 MED ORDER — HYDROCHLOROTHIAZIDE 12.5 MG PO CAPS
12.5000 mg | ORAL_CAPSULE | Freq: Every day | ORAL | Status: DC
  Administered 2019-09-20: 12.5 mg via ORAL
  Filled 2019-09-20: qty 1

## 2019-09-20 MED ORDER — HYDROCHLOROTHIAZIDE 25 MG PO TABS
25.0000 mg | ORAL_TABLET | Freq: Every day | ORAL | Status: DC
  Administered 2019-09-21 – 2019-09-27 (×7): 25 mg via ORAL
  Filled 2019-09-20 (×7): qty 1

## 2019-09-20 MED ORDER — ASPIRIN 325 MG PO TABS
325.0000 mg | ORAL_TABLET | Freq: Every day | ORAL | Status: DC
  Administered 2019-09-20 – 2019-09-27 (×8): 325 mg via ORAL
  Filled 2019-09-20 (×8): qty 1

## 2019-09-20 MED ORDER — ATORVASTATIN CALCIUM 40 MG PO TABS
40.0000 mg | ORAL_TABLET | Freq: Every day | ORAL | Status: DC
  Administered 2019-09-20 – 2019-09-27 (×8): 40 mg via ORAL
  Filled 2019-09-20 (×8): qty 1

## 2019-09-20 MED ORDER — CABERGOLINE 0.5 MG PO TABS
0.5000 mg | ORAL_TABLET | ORAL | Status: DC
  Administered 2019-09-21 – 2019-09-25 (×2): 0.5 mg via ORAL
  Filled 2019-09-20 (×3): qty 1

## 2019-09-20 MED ORDER — CLONIDINE HCL 0.1 MG PO TABS
0.1000 mg | ORAL_TABLET | Freq: Three times a day (TID) | ORAL | Status: DC
  Administered 2019-09-20 – 2019-09-21 (×2): 0.1 mg via ORAL
  Filled 2019-09-20 (×2): qty 1

## 2019-09-20 NOTE — Progress Notes (Signed)
Physical Therapy Treatment Patient Details Name: Andrew Perez MRN: [id number] DOB: 19-May-1965 Today's Date: 09/20/2019    History of Present Illness 54 yo male admitted with IVH of 4th ventricle  PMH HTN  morbid obese previous IVH 2018. 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     PT Comments    PT performs re-evaluation after pt underwent embolization of R PICA aneurysm with sacrifice of the right PICA at the  telovelotonsillar segment on 6/3. Pt demonstrates slowed cognition compared to evaluation as well as impaired vision vs coordination. Pt also requiring physical assistance to mobilize into standing and to steady when taking short side steps at edge of bed. Pt does have one posterior LOB resulting in a return to a seated position at the edge of bed during session, and is at a high falls risk due to his current deficits. PT updates goals and plan of care according to the patient's current functional status and updates recommendations to CIR at this time due to new deficits.  Follow Up Recommendations  CIR;Supervision/Assistance - 24 hour     Equipment Recommendations  Rolling walker with 5" wheels    Recommendations for Other Services Rehab consult     Precautions / Restrictions Precautions Precautions: Fall Precaution Comments: BP<160, RUE elevated 24 hours post-cath Restrictions Weight Bearing Restrictions: No    Mobility  Bed Mobility Overal bed mobility: Needs Assistance Bed Mobility: Supine to Sit;Sit to Supine     Supine to sit: Min guard Sit to supine: Min guard      Transfers Overall transfer level: Needs assistance Equipment used: 1 person hand held assist Transfers: Sit to/from Stand Sit to Stand: Min assist         General transfer comment: cues to not push with RUE s/p cath  Ambulation/Gait Ambulation/Gait assistance: Min assist Gait Distance (Feet): 2 Feet Assistive  device: 1 person hand held assist Gait Pattern/deviations: Shuffle Gait velocity: reduced Gait velocity interpretation: <1.8 ft/sec, indicate of risk for recurrent falls General Gait Details: short side steps toward head of bed. Pt initially taking step forward when asked to side step, requires tactile cue to facilitate side steps to left   Stairs             Wheelchair Mobility    Modified Rankin (Stroke Patients Only) Modified Rankin (Stroke Patients Only) Pre-Morbid Rankin Score: No symptoms Modified Rankin: Moderate disability     Balance Overall balance assessment: Needs assistance Sitting-balance support: No upper extremity supported;Feet supported Sitting balance-Leahy Scale: Fair Sitting balance - Comments: close supervision at the edge of bed   Standing balance support: No upper extremity supported Standing balance-Leahy Scale: Fair Standing balance comment: minG-minA without UE support at edge of bed                            Cognition Arousal/Alertness: Awake/alert Behavior During Therapy: Flat affect Overall Cognitive Status: Impaired/Different from baseline Area of Impairment: Problem solving;Awareness;Safety/judgement                         Safety/Judgement: Decreased awareness of safety;Decreased awareness of deficits Awareness: Intellectual Problem Solving: Slow processing        Exercises      General Comments General comments (skin integrity, edema, etc.): BP pre-mobility 146/64, BP after bed-level assessment 151/63, BP after mobilizing 167/67 (95). PT makes  RN aware. Pt with impaired coordination vs vision with BUE, demonstrating dysmetria when attempting to touch finger in R visual field with both UEs      Pertinent Vitals/Pain Pain Assessment: 0-10 Pain Score: 8  Pain Location: head Pain Descriptors / Indicators: Aching Pain Intervention(s): Monitored during session    Home Living                       Prior Function            PT Goals (current goals can now be found in the care plan section) Acute Rehab PT Goals Patient Stated Goal: To return to independent mobility PT Goal Formulation: With patient Time For Goal Achievement: 10/04/19 Potential to Achieve Goals: Good Additional Goals Additional Goal #1: pt will maintain balance within 10 inches of his base of support without UE support with supervision Progress towards PT goals: Goals downgraded-see care plan    Frequency    Min 4X/week      PT Plan Discharge plan needs to be updated    Co-evaluation              AM-PAC PT "6 Clicks" Mobility   Outcome Measure  Help needed turning from your back to your side while in a flat bed without using bedrails?: None Help needed moving from lying on your back to sitting on the side of a flat bed without using bedrails?: A Little Help needed moving to and from a bed to a chair (including a wheelchair)?: A Little Help needed standing up from a chair using your arms (e.g., wheelchair or bedside chair)?: A Little Help needed to walk in hospital room?: A Little Help needed climbing 3-5 steps with a railing? : A Lot 6 Click Score: 18    End of Session Equipment Utilized During Treatment: Oxygen Activity Tolerance: Patient limited by pain(headache) Patient left: in bed;with call bell/phone within reach;with bed alarm set Nurse Communication: Mobility status PT Visit Diagnosis: Unsteadiness on feet (R26.81);Other abnormalities of gait and mobility (R26.89);Other symptoms and signs involving the nervous system (U20.254)     Time: 2706-2376 PT Time Calculation (min) (ACUTE ONLY): 19 min  Charges:                        Zenaida Niece, PT, DPT Acute Rehabilitation Pager: [phone]    Zenaida Niece 09/20/2019, 3:40 PM

## 2019-09-20 NOTE — Plan of Care (Signed)
C/o nausea BP: 155/61  Recs: Zofran IV 4mg  x1 CTH stat  -- Amie Portland, MD [facility] Pager: [phone] If 7pm to 7am, please call on call as listed on AMION.

## 2019-09-20 NOTE — Progress Notes (Addendum)
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 Karenann Cai. Patient awake and alert sitting in bed. Complains of mild headache. Did have some nausea last evening (no vomiting), it has subsided at this time. Can spontaneously move all extremities. Right radial incision c/d/i.  CT head this AM: 1. 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 No hydrocephalus or interval hemorrhage.   Allergies: Codeine and Shellfish allergy  Medications: Prior to Admission medications   Medication Sig Start Date End Date Taking? Authorizing Provider  amLODipine (NORVASC) 10 MG tablet Take 1 tablet (10 mg total) by mouth daily. 07/04/16  Yes Rinehuls, Early Chars, PA-C  cabergoline (DOSTINEX) 0.5 MG tablet Take 0.5 mg by mouth 2 (two) times a week. Saturday and wednesday 06/24/16  Yes [provider]  cloNIDine (CATAPRES - DOSED IN MG/24 HR) 0.3 mg/24hr patch Place 0.3 mg onto the skin every 7 (seven) days. Saturday 03/12/18  Yes [provider]  hydrALAZINE (APRESOLINE) 100 MG tablet Take 1 tablet (100 mg total) by mouth every 8 (eight) hours. Patient taking differently: Take 100 mg by mouth 3 (three) times daily.  07/03/16  Yes Rinehuls, Early Chars, PA-C  levothyroxine (SYNTHROID, LEVOTHROID) 100 MCG tablet Take 100 mcg by mouth daily. 02/22/18  Yes [provider]  losartan (COZAAR) 100 MG tablet Take 100 mg by mouth daily. 03/20/18  Yes [provider]  cloNIDine (CATAPRES) 0.1 MG tablet One tablet three times daily Patient not taking: Reported on 04/13/2018 07/03/16   Rinehuls, Early Chars, PA-C  lisinopril (PRINIVIL,ZESTRIL) 20 MG tablet Take 1  tablet (20 mg total) by mouth 2 (two) times daily. Patient not taking: Reported on 04/13/2018 07/03/16   Rinehuls, Early Chars, PA-C     Vital Signs: BP (!) 185/65   Pulse 65   Temp 97.8 F (36.6 C) (Oral)   Resp 18   Ht 5' 11"  (1.803 m)   Wt (!) 418 lb 6.9 oz (189.8 kg)   SpO2 95%   BMI 58.36 kg/m   Physical Exam Vitals and nursing note reviewed.  Constitutional:      General: He is not in acute distress.    Appearance: Normal appearance.  Pulmonary:     Effort: Pulmonary effort is normal. No respiratory distress.  Skin:    General: Skin is warm and dry.     Comments: Right radial incision soft without active bleeding or hematoma.  Neurological:     Mental Status: He is alert.     Comments: Alert, awake, and oriented x3. Speech and comprehension intact. PERRL bilaterally. EOMs intact bilaterally without nystagmus. No facial asymmetry. Tongue midline. Can spontaneously move all extremities, hand grip strength equal bilaterally. No pronator drift. Radial pulses 2+ bilaterally.     Imaging: CT HEAD WO CONTRAST  Result Date: 09/20/2019 CLINICAL DATA:  Stroke follow-up EXAM: CT HEAD WITHOUT CONTRAST TECHNIQUE: Contiguous axial images were obtained from the base of the skull through the vertex without intravenous contrast. 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 Onyx is seen at the level of vessel embolization. Narrowing of the lower fourth ventricle without hydrocephalus. There is small volume intraventricular hemorrhage which is not progressed. Vascular: As  above Skull: No acute or aggressive finding. Sinuses/Orbits: Right mastoid and middle ear opacification. 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 No hydrocephalus or interval hemorrhage. Electronically Signed   By: Monte Fantasia M.D.   On: 09/20/2019 07:13   CT HEAD WO CONTRAST  Result Date:  09/18/2019 CLINICAL DATA:  Follow-up subarachnoid hemorrhage EXAM: CT HEAD WITHOUT CONTRAST TECHNIQUE: Contiguous axial images were obtained from the base of the skull through the vertex without intravenous contrast. COMPARISON:  Head CT from yesterday and 06/28/2016 FINDINGS: Brain: Slight interval redistribution of small volume intraventricular hemorrhage seen at the fourth, third, and lateral ventricles. Potentially parenchymal high-density at the roof of the fourth ventricle. Fourth ventricular hemorrhage was seen in 2018 as well. Lytic and low-density skull base mass with well-defined margins and very low-density. 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 No evidence of infarct.  No hydrocephalus. Vascular: Negative by noncontrast CT Skull: Tumoral skull base erosion centered at the sella. Sinuses/Orbits: Negative IMPRESSION: 1. Allowing for redistribution, no convincing increase in the intraventricular hemorrhage. No hydrocephalus. 2. Fourth ventricular hemorrhage was also seen in 2018, recommend updated MRI/MRA to evaluate for underlying lesion. 3. Known sellar mass with skull base erosion. Electronically Signed   By: Monte Fantasia M.D.   On: 09/18/2019 04:36   CT HEAD WO CONTRAST  Result Date: 09/17/2019 CLINICAL DATA:  Headache. 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 EXAM: CT HEAD WITHOUT CONTRAST TECHNIQUE: Contiguous axial images were obtained from the base of the skull through the vertex without intravenous contrast. COMPARISON:  Brain MR 06/28/2016.  CT 06/28/2016. FINDINGS: Brain: Previously identified infiltrative mass lesion at the central skull base appears to have at least been partially resected since previous. Evaluation for residual tumor limited on this noncontrast CT examination. Small amount of hyperdensity seen within the fourth ventricle, suspicious for acute intraventricular hemorrhage (series 3, image 11). This measures up to 1 cm in  maximal diameter. Overall ventricular size and morphology is fairly stable without obstructive hydrocephalus or trapping. No other acute intracranial hemorrhage. No acute large vessel territory infarct. No other mass lesion, mass effect, or midline shift. No extra-axial fluid collection. Vascular: No hyperdense vessel. Scattered vascular calcifications noted within the carotid siphons. Skull: Scalp soft tissues demonstrate no acute abnormality. Calvarium intact. Sinuses/Orbits: Globes and orbital soft tissues within normal limits. Small left maxillary sinus retention cyst noted. Paranasal sinuses are otherwise largely clear. Chronic right mastoid and middle ear effusion noted. IMPRESSION: 1. Small volume acute intraventricular hemorrhage involving the fourth ventricle. No evidence for obstructive hydrocephalus. 2. Suspected at least partial interval resection of previously identified infiltrative mass lesion at the central skull base. Evaluation for residual tumor limited on this noncontrast CT examination. 3. No other acute intracranial abnormality. 4. Chronic right mastoid and middle ear effusion. Critical Value/emergent results were called by telephone at the time of interpretation on 09/17/2019 at 7:38 pm to provider [facility] , who verbally acknowledged these results. Electronically Signed   By: Jeannine Boga M.D.   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 He presented with acute onset of posterior headache, dizziness and vomiting on 09/17/2019. 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 Diagnostic cerebral angiogram performed on 09/18/2019 showed a dissected right PICA aneurysm, most likely  the source of bleed. EXAM: DIAGNOSTIC CEREBRAL ANGIOGRAM AND  ENDOVASCULAR EMBOLIZATION COMPARISON:  Diagnostic cerebral angiogram 09/18/2019 MEDICATIONS: No antibiotic administered. ANESTHESIA/SEDATION: The procedure was performed under general anesthesia. FLUOROSCOPY TIME:  Fluoroscopy Time: 74 minutes 42 seconds (5436 mGy). COMPLICATIONS: SIR LEVEL B - Normal therapy, includes overnight admission for observation. TECHNIQUE: Informed written consent was obtained from the patient after a thorough discussion of the procedural risks, benefits and alternatives. All questions were addressed. Maximal Sterile Barrier Technique was utilized including caps, mask, sterile gowns, sterile gloves, sterile drape, hand hygiene and skin antiseptic. A timeout was performed prior to the initiation of the procedure. Real-time ultrasound guidance was utilized for vascular access including the acquisition of a permanent ultrasound image documenting patency of the accessed vessel. Using the modified Seldinger technique and a micropuncture kit, access was gained to the right radial artery at the wrist and a 6 French sheath was placed. Slow intra arterial infusion of 5,000 IU heparin, 5 mg Verapamil and 200 mcg nitroglycerin diluted in patient's own blood was performed. No significant fluctuation in patient's blood pressure seen. Then, a right radial artery angiogram was obtained via sheath side port. Normal brachial artery branching pattern seen. No significant anatomical variation. The right radial artery caliber is adequate for vascular access. Then, a 5 [place] Simmons 2 glide catheter was navigated over a 0.035 inch Terumo Glidewire into the right subclavian artery. A right subclavian artery roadmap was obtained. The catheter was then advanced into the V2 segment of the right vertebral artery. Townes and lateral views of the head were obtained centered on the posterior fossa. 3D rotational angiograms were acquired and post processed in a separate workstation under concurrent attending physician  supervision. Selected images were sent to PACS. FINDINGS: Again seen is a 5 mm dissecting aneurysm as seen on prior angiogram. However, it appears that this aneurysm originates from a dissected branch off of the telovelotonsillar segment of the right posterior inferior cerebellar artery (PICA). The right vertebral artery, basilar artery, and bilateral posterior cerebral arteries are unremarkable. No abnormally high-flow, early draining veins are seen. No regions of abnormal hypervascularity are noted. The visualized dural sinuses are patent. PROCEDURE: Under biplane roadmap, the Simmons 2 catheter was removed over the wire. Intra arterial infusion of 5000 units of heparin, 200 mcg of nitroglycerin and 5 mg of verapamil was performed to the right radial artery via sheath side port. Then, a benchmark guide catheter was advanced over the wire and under fluoroscopic guidance into the distal V2 segment of the right vertebral artery. Magnified frontal and lateral views of the head were obtained in the working projections. Using biplane roadmap, a headway duo microcatheter was coaxially advanced over a synchro 2 micro guidewire into the right vertebral artery and then navigated into the right posteroinferior cerebellar artery. Frontal and lateral angiograms were obtained with microcatheter contrast injection. The microcatheter was advanced near the origin of the target vessel. Attempted placement of a 2 mm x 4 cm Target 360 Nano coil proved unsuccessful. Follow-up right vertebral artery angiograms with magnified frontal and lateral views of the head were projections obtained. The microcatheter was then advanced and wedged at the origin of the right targeted PICA branch. The microcatheter was then prepped with DMSO. Following, slow injection of onyx 18 was performed under fluoroscopy. Embolization of the target vessel achieved. Small reflux into the PICA noted. The catheter was removed under constant aspiration. Follow-up  angiogram showed occlusion of PICA at the telovelotonsillar segment. Right vertebral artery angiograms  with frontal and lateral views of the entire head to evidence of distal thromboembolic complication it. Flat panel CT of the head was obtained and post processed in a separate workstation with concurrent attending physician supervision. Selected images were sent to PACS. No evidence of hemorrhagic complication noted. The catheter was subsequently withdrawn. The right radial sheath was removed and inflatable band was placed over the access site for patent hemostasis. 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 PLAN: 1. Continued observation in ICU. 2. Stat head CT for any neurological deterioration. Patient and spouse communicated in person. Electronically Signed   By: Pedro Earls M.D.   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 He presented with acute onset of posterior headache, dizziness and vomiting on 09/17/2019. 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 EXAM: DIAGNOSTIC CEREBRAL ANGIOGRAM AND ENDOVASCULAR EMBOLIZATION COMPARISON:  Diagnostic cerebral angiogram 09/18/2019 MEDICATIONS: No antibiotic administered. ANESTHESIA/SEDATION: The procedure was performed under general anesthesia. FLUOROSCOPY TIME:  Fluoroscopy Time: 74 minutes 42 seconds (5436 mGy). COMPLICATIONS: SIR LEVEL B - Normal therapy, includes overnight admission for observation. TECHNIQUE: Informed written consent was obtained from the patient after a thorough  discussion of the procedural risks, benefits and alternatives. All questions were addressed. Maximal Sterile Barrier Technique was utilized including caps, mask, sterile gowns, sterile gloves, sterile drape, hand hygiene and skin antiseptic. A timeout was performed prior to the initiation of the procedure. Real-time ultrasound guidance was utilized for vascular access including the acquisition of a permanent ultrasound image documenting patency of the accessed vessel. Using the modified Seldinger technique and a micropuncture kit, access was gained to the right radial artery at the wrist and a 6 French sheath was placed. Slow intra arterial infusion of 5,000 IU heparin, 5 mg Verapamil and 200 mcg nitroglycerin diluted in patient's own blood was performed. No significant fluctuation in patient's blood pressure seen. Then, a right radial artery angiogram was obtained via sheath side port. Normal brachial artery branching pattern seen. No significant anatomical variation. The right radial artery caliber is adequate for vascular access. Then, a 5 [place] Simmons 2 glide catheter was navigated over a 0.035 inch Terumo Glidewire into the right subclavian artery. A right subclavian artery roadmap was obtained. The catheter was then advanced into the V2 segment of the right vertebral artery. Townes and lateral views of the head were obtained centered on the posterior fossa. 3D rotational angiograms were acquired and post processed in a separate workstation under concurrent attending physician supervision. Selected images were sent to PACS. FINDINGS: Again seen is a 5 mm dissecting aneurysm as seen on prior angiogram. However, it appears that this aneurysm originates from a dissected branch off of the telovelotonsillar segment of the right posterior inferior cerebellar artery (PICA). The right vertebral artery, basilar artery, and bilateral posterior cerebral arteries are unremarkable. No abnormally high-flow, early draining  veins are seen. No regions of abnormal hypervascularity are noted. The visualized dural sinuses are patent. PROCEDURE: Under biplane roadmap, the Simmons 2 catheter was removed over the wire. Intra arterial infusion of 5000 units of heparin, 200 mcg of nitroglycerin and 5 mg of verapamil was performed to the  right radial artery via sheath side port. Then, a benchmark guide catheter was advanced over the wire and under fluoroscopic guidance into the distal V2 segment of the right vertebral artery. Magnified frontal and lateral views of the head were obtained in the working projections. Using biplane roadmap, a headway duo microcatheter was coaxially advanced over a synchro 2 micro guidewire into the right vertebral artery and then navigated into the right posteroinferior cerebellar artery. Frontal and lateral angiograms were obtained with microcatheter contrast injection. The microcatheter was advanced near the origin of the target vessel. Attempted placement of a 2 mm x 4 cm Target 360 Nano coil proved unsuccessful. Follow-up right vertebral artery angiograms with magnified frontal and lateral views of the head were projections obtained. The microcatheter was then advanced and wedged at the origin of the right targeted PICA branch. The microcatheter was then prepped with DMSO. Following, slow injection of onyx 18 was performed under fluoroscopy. Embolization of the target vessel achieved. Small reflux into the PICA noted. The catheter was removed under constant aspiration. Follow-up angiogram showed occlusion of PICA at the telovelotonsillar segment. Right vertebral artery angiograms with frontal and lateral views of the entire head to evidence of distal thromboembolic complication it. Flat panel CT of the head was obtained and post processed in a separate workstation with concurrent attending physician supervision. Selected images were sent to PACS. No evidence of hemorrhagic complication noted. The catheter was  subsequently withdrawn. The right radial sheath was removed and inflatable band was placed over the access site for patent hemostasis. 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 PLAN: 1. Continued observation in ICU. 2. Stat head CT for any neurological deterioration. Patient and spouse communicated in person. Electronically Signed   By: Pedro Earls M.D.   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 He presented with acute onset of posterior headache, dizziness and vomiting on 09/17/2019. 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 EXAM: DIAGNOSTIC CEREBRAL ANGIOGRAM AND ENDOVASCULAR EMBOLIZATION COMPARISON:  Diagnostic cerebral angiogram 09/18/2019 MEDICATIONS: No antibiotic administered. ANESTHESIA/SEDATION: The procedure was performed under general anesthesia. FLUOROSCOPY TIME:  Fluoroscopy Time: 74 minutes 42 seconds (5436 mGy). COMPLICATIONS: SIR LEVEL B - Normal therapy, includes overnight admission for observation. TECHNIQUE: Informed written consent was obtained from the patient after a thorough discussion of the procedural risks, benefits and alternatives. All questions were addressed. Maximal Sterile Barrier Technique was utilized including caps, mask, sterile gowns, sterile gloves, sterile drape, hand hygiene and skin antiseptic. A timeout was performed prior to the initiation of the procedure. Real-time ultrasound guidance was utilized for vascular access including the acquisition of a permanent ultrasound image documenting patency  of the accessed vessel. Using the modified Seldinger technique and a micropuncture kit, access was gained to the right radial artery at the wrist and a 6 French sheath was placed. Slow intra arterial infusion of 5,000 IU heparin, 5 mg Verapamil and 200 mcg nitroglycerin diluted in patient's own blood was performed. No significant fluctuation in patient's blood pressure seen. Then, a right radial artery angiogram was obtained via sheath side port. Normal brachial artery branching pattern seen. No significant anatomical variation. The right  radial artery caliber is adequate for vascular access. Then, a 5 [place] Simmons 2 glide catheter was navigated over a 0.035 inch Terumo Glidewire into the right subclavian artery. A right subclavian artery roadmap was obtained. The catheter was then advanced into the V2 segment of the right vertebral artery. Townes and lateral views of the head were obtained centered on the posterior fossa. 3D rotational angiograms were acquired and post processed in a separate workstation under concurrent attending physician supervision. Selected images were sent to PACS. FINDINGS: Again seen is a 5 mm dissecting aneurysm as seen on prior angiogram. However, it appears that this aneurysm originates from a dissected branch off of the telovelotonsillar segment of the right posterior inferior cerebellar artery (PICA). The right vertebral artery, basilar artery, and bilateral posterior cerebral arteries are unremarkable. No abnormally high-flow, early draining veins are seen. No regions of abnormal hypervascularity are noted. The visualized dural sinuses are patent. PROCEDURE: Under biplane roadmap, the Simmons 2 catheter was removed over the wire. Intra arterial infusion of 5000 units of heparin, 200 mcg of nitroglycerin and 5 mg of verapamil was performed to the right radial artery via sheath side port. Then, a benchmark guide catheter was advanced over the wire and under fluoroscopic guidance into  the distal V2 segment of the right vertebral artery. Magnified frontal and lateral views of the head were obtained in the working projections. Using biplane roadmap, a headway duo microcatheter was coaxially advanced over a synchro 2 micro guidewire into the right vertebral artery and then navigated into the right posteroinferior cerebellar artery. Frontal and lateral angiograms were obtained with microcatheter contrast injection. The microcatheter was advanced near the origin of the target vessel. Attempted placement of a 2 mm x 4 cm Target 360 Nano coil proved unsuccessful. Follow-up right vertebral artery angiograms with magnified frontal and lateral views of the head were projections obtained. The microcatheter was then advanced and wedged at the origin of the right targeted PICA branch. The microcatheter was then prepped with DMSO. Following, slow injection of onyx 18 was performed under fluoroscopy. Embolization of the target vessel achieved. Small reflux into the PICA noted. The catheter was removed under constant aspiration. Follow-up angiogram showed occlusion of PICA at the telovelotonsillar segment. Right vertebral artery angiograms with frontal and lateral views of the entire head to evidence of distal thromboembolic complication it. Flat panel CT of the head was obtained and post processed in a separate workstation with concurrent attending physician supervision. Selected images were sent to PACS. No evidence of hemorrhagic complication noted. The catheter was subsequently withdrawn. The right radial sheath was removed and inflatable band was placed over the access site for patent hemostasis. 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 PLAN: 1. Continued observation in ICU. 2. Stat head CT for any neurological deterioration. Patient and spouse communicated in person. Electronically Signed   By: Pedro Earls M.D.   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 He presented with acute onset of posterior headache, dizziness and vomiting on 09/17/2019. 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 EXAM: DIAGNOSTIC CEREBRAL ANGIOGRAM AND ENDOVASCULAR EMBOLIZATION COMPARISON:  Diagnostic cerebral angiogram 09/18/2019  MEDICATIONS: No antibiotic administered. ANESTHESIA/SEDATION: The procedure was performed under general anesthesia. FLUOROSCOPY TIME:  Fluoroscopy Time: 74 minutes 42 seconds (5436 mGy). COMPLICATIONS: SIR LEVEL B - Normal therapy, includes overnight admission for observation. TECHNIQUE: Informed written consent was obtained from the patient after a thorough discussion of the procedural risks, benefits and alternatives. All questions were addressed. Maximal Sterile Barrier Technique was utilized including caps, mask, sterile gowns, sterile gloves, sterile drape, hand hygiene and skin antiseptic. A timeout was performed prior to the initiation of the procedure. Real-time ultrasound guidance was utilized for vascular access including the acquisition of a permanent ultrasound image documenting patency of the accessed vessel. Using the modified Seldinger technique and a micropuncture kit, access was gained to the right radial artery at the wrist and a 6 French sheath was placed. Slow intra arterial infusion of 5,000 IU heparin, 5 mg Verapamil and 200 mcg nitroglycerin diluted in patient's own blood was performed. No significant fluctuation in patient's blood pressure seen. Then, a right radial artery angiogram was obtained via sheath side port.  Normal brachial artery branching pattern seen. No significant anatomical variation. The right radial artery caliber is adequate for vascular access. Then, a 5 [place] Simmons 2 glide catheter was navigated over a 0.035 inch Terumo Glidewire into the right subclavian artery. A right subclavian artery roadmap was obtained. The catheter was then advanced into the V2 segment of the right vertebral artery. Townes and lateral views of the head were obtained centered on the posterior fossa. 3D rotational angiograms were acquired and post processed in a separate workstation under concurrent attending physician supervision. Selected images were sent to PACS. FINDINGS: Again seen is a 5 mm dissecting aneurysm as seen on prior angiogram. However, it appears that this aneurysm originates from a dissected branch off of the telovelotonsillar segment of the right posterior inferior cerebellar artery (PICA). The right vertebral artery, basilar artery, and bilateral posterior cerebral arteries are unremarkable. No abnormally high-flow, early draining veins are seen. No regions of abnormal hypervascularity are noted. The visualized dural sinuses are patent. PROCEDURE: Under biplane roadmap, the Simmons 2 catheter was removed over the wire. Intra arterial infusion of 5000 units of heparin, 200 mcg of nitroglycerin and 5 mg of verapamil was performed to the right radial artery via sheath side port. Then, a benchmark guide catheter was advanced over the wire and under fluoroscopic guidance into the distal V2 segment of the right vertebral artery. Magnified frontal and lateral views of the head were obtained in the working projections. Using biplane roadmap, a headway duo microcatheter was coaxially advanced over a synchro 2 micro guidewire into the right vertebral artery and then navigated into the right posteroinferior cerebellar artery. Frontal and lateral angiograms were obtained with microcatheter contrast injection. The  microcatheter was advanced near the origin of the target vessel. Attempted placement of a 2 mm x 4 cm Target 360 Nano coil proved unsuccessful. Follow-up right vertebral artery angiograms with magnified frontal and lateral views of the head were projections obtained. The microcatheter was then advanced and wedged at the origin of the right targeted PICA branch. The microcatheter was then prepped with DMSO. Following, slow injection of onyx 18 was performed under fluoroscopy. Embolization of the target vessel achieved. Small reflux into the PICA noted. The catheter was removed under constant aspiration. Follow-up angiogram showed occlusion of PICA at the telovelotonsillar segment. Right vertebral artery angiograms with frontal and lateral views of the entire head to evidence of distal thromboembolic complication it. Flat panel  CT of the head was obtained and post processed in a separate workstation with concurrent attending physician supervision. Selected images were sent to PACS. No evidence of hemorrhagic complication noted. The catheter was subsequently withdrawn. The right radial sheath was removed and inflatable band was placed over the access site for patent hemostasis. 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 PLAN: 1. Continued observation in ICU. 2. Stat head CT for any neurological deterioration. Patient and spouse communicated in person. Electronically Signed   By: Pedro Earls M.D.   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 He presented with acute onset of posterior headache, dizziness and vomiting on 09/17/2019. Head CT showed recurrent for ventricular hemorrhage without  hydrocephalus. 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 EXAM: DIAGNOSTIC CEREBRAL ANGIOGRAM AND ENDOVASCULAR EMBOLIZATION COMPARISON:  Diagnostic cerebral angiogram 09/18/2019 MEDICATIONS: No antibiotic administered. ANESTHESIA/SEDATION: The procedure was performed under general anesthesia. FLUOROSCOPY TIME:  Fluoroscopy Time: 74 minutes 42 seconds (5436 mGy). COMPLICATIONS: SIR LEVEL B - Normal therapy, includes overnight admission for observation. TECHNIQUE: Informed written consent was obtained from the patient after a thorough discussion of the procedural risks, benefits and alternatives. All questions were addressed. Maximal Sterile Barrier Technique was utilized including caps, mask, sterile gowns, sterile gloves, sterile drape, hand hygiene and skin antiseptic. A timeout was performed prior to the initiation of the procedure. Real-time ultrasound guidance was utilized for vascular access including the acquisition of a permanent ultrasound image documenting patency of the accessed vessel. Using the modified Seldinger technique and a micropuncture kit, access was gained to the right radial artery at the wrist and a 6 French sheath was placed. Slow intra arterial infusion of 5,000 IU heparin, 5 mg Verapamil and 200 mcg nitroglycerin diluted in patient's own blood was performed. No significant fluctuation in patient's blood pressure seen. Then, a right radial artery angiogram was obtained via sheath side port. Normal brachial artery branching pattern seen. No significant anatomical variation. The right radial artery caliber is adequate for vascular access. Then, a 5 [place] Simmons 2 glide catheter was navigated over a 0.035 inch Terumo Glidewire into the right subclavian artery. A right subclavian artery roadmap was obtained. The catheter was then advanced into the V2 segment of the right vertebral artery. Townes and lateral views  of the head were obtained centered on the posterior fossa. 3D rotational angiograms were acquired and post processed in a separate workstation under concurrent attending physician supervision. Selected images were sent to PACS. FINDINGS: Again seen is a 5 mm dissecting aneurysm as seen on prior angiogram. However, it appears that this aneurysm originates from a dissected branch off of the telovelotonsillar segment of the right posterior inferior cerebellar artery (PICA). The right vertebral artery, basilar artery, and bilateral posterior cerebral arteries are unremarkable. No abnormally high-flow, early draining veins are seen. No regions of abnormal hypervascularity are noted. The visualized dural sinuses are patent. PROCEDURE: Under biplane roadmap, the Simmons 2 catheter was removed over the wire. Intra arterial infusion of 5000 units of heparin, 200 mcg of nitroglycerin and 5 mg of verapamil was performed to the right radial artery via sheath side port. Then, a benchmark guide catheter was advanced over the wire  and under fluoroscopic guidance into the distal V2 segment of the right vertebral artery. Magnified frontal and lateral views of the head were obtained in the working projections. Using biplane roadmap, a headway duo microcatheter was coaxially advanced over a synchro 2 micro guidewire into the right vertebral artery and then navigated into the right posteroinferior cerebellar artery. Frontal and lateral angiograms were obtained with microcatheter contrast injection. The microcatheter was advanced near the origin of the target vessel. Attempted placement of a 2 mm x 4 cm Target 360 Nano coil proved unsuccessful. Follow-up right vertebral artery angiograms with magnified frontal and lateral views of the head were projections obtained. The microcatheter was then advanced and wedged at the origin of the right targeted PICA branch. The microcatheter was then prepped with DMSO. Following, slow injection of  onyx 18 was performed under fluoroscopy. Embolization of the target vessel achieved. Small reflux into the PICA noted. The catheter was removed under constant aspiration. Follow-up angiogram showed occlusion of PICA at the telovelotonsillar segment. Right vertebral artery angiograms with frontal and lateral views of the entire head to evidence of distal thromboembolic complication it. Flat panel CT of the head was obtained and post processed in a separate workstation with concurrent attending physician supervision. Selected images were sent to PACS. No evidence of hemorrhagic complication noted. The catheter was subsequently withdrawn. The right radial sheath was removed and inflatable band was placed over the access site for patent hemostasis. 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 PLAN: 1. Continued observation in ICU. 2. Stat head CT for any neurological deterioration. Patient and spouse communicated in person. Electronically Signed   By: Pedro Earls M.D.   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 He presented with acute onset of posterior headache, dizziness and vomiting on 09/17/2019. 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 Given recurrence of hemorrhage in the same location, further investigation with a diagnostic cerebral angiogram was recommended. EXAM: Diagnostic cerebral angiogram COMPARISON:  CT angiogram of the head July 01, 2016 MEDICATIONS: No antibiotics given. ANESTHESIA/SEDATION: Versed 0.5 mg IV; Fentanyl 50 mcg IV Moderate Sedation Time:  60 The patient was continuously monitored during the procedure by the interventional  radiology nurse under my direct supervision. FLUOROSCOPY TIME:  Fluoroscopy Time: 14 minutes 30 seconds (1656 mGy). COMPLICATIONS: None immediate. TECHNIQUE: Informed written consent was obtained from the patient after a thorough discussion of the procedural risks, benefits and alternatives. All questions were addressed. Maximal Sterile Barrier Technique was utilized including caps, mask, sterile gowns, sterile gloves, sterile drape, hand hygiene and skin antiseptic. A timeout was performed prior to the initiation of the procedure. Using the modified Seldinger technique and a micropuncture kit, access was gained to the right radial artery at the wrist and a 5 French sheath was placed. Slow intra arterial infusion of 5,000 IU heparin, 5 mg Verapamil and 628 mcg nitroglicerin diluted in patient's own blood was performed. No significant fluctuation in patient's blood pressure seen. Then, a right radial artery angiogram was obtained via sheath side port. Normal brachial artery branching pattern seen. No significant anatomical variation. The right radial artery caliber is adequate for vascular access. Then, a 5 [place] Simmons 2 glide catheter was navigated over a 0.035 inch Terumo  Glidewire into the right subclavian artery. A right subclavian artery roadmap was obtained. The catheter was then advanced into the V2 segment of the right vertebral artery. Townes, lateral and bilateral magnified oblique views of the head were obtained centered on the posterior fossa. The catheter was then placed into the aortic arch and then advanced into the left common carotid artery. Left anterior oblique view of the neck was obtained followed by frontal, lateral and bilateral oblique views of the head. The catheter was then placed into the left subclavian artery. Frontal and lateral angiograms of the head were obtained followed by frontal the angiogram of the neck. The catheter was subsequently placed in the right common carotid artery.  Right anterior oblique view of the neck was obtained. Following, frontal, lateral and bilateral oblique views of the head were obtained. The catheter was subsequently withdrawn. The right radial artery sheath was removed and inflatable band was placed over the access site for patent hemostasis. FINDINGS: Right radial artery ultrasound and right radial artery angiogram: The caliber of the distal right radial artery is appropriate for angiogram access. The right radial artery and the right ulnar artery have normal course and caliber. No significant anatomical variants noted. Right vertebral artery angiograms: The right vertebral artery, basilar artery, and bilateral posterior cerebral arteries are unremarkable. 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 No abnormally high-flow, early draining veins are seen. No regions of abnormal hypervascularity are noted. The visualized dural sinuses are patent. Left CCA angiograms: Cervical angiograms show normal course and caliber of the visualized left common carotid and internal carotid arteries. There are no significant stenoses. There is brisk vascular contrast filling of the the ACA and MCA vascular trees. Luminal caliber is smooth and tapering. No aneurysms or abnormally high-flow, early draining veins are seen. No regions of abnormal hypervascularity are noted. The intracranial branches of the left external carotid artery are unremarkable. The visualized dural sinuses are patent. Left subclavian and thyrocervical angiograms: The visualized subclavian artery, proximal left vertebral artery and visualized branches of the thyrocervical trunk are unremarkable. Suboptimal opacification of the intracranial vasculature obtained. The left vertebral artery is codominant and of normal caliber. Right CCA angiograms: Cervical angiograms show normal course and caliber of the visualized  right common carotid and internal carotid arteries. There are no significant stenoses. There is brisk vascular contrast filling of the ACA and MCA vascular trees. Luminal caliber is smooth and tapering. No aneurysms or abnormally high-flow, early draining veins are seen. No regions of abnormal hypervascularity are noted. The intracranial branches of the right external carotid artery are unremarkable. The visualized dural sinuses are patent. PROCEDURE: Not applicable. 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 PLAN: Angiogram findings discussed with the patient and his wife as well as with Dr. Erlinda Hong. Planned for angiogram with aneurysm embolization tomorrow. Electronically Signed   By: Pedro Earls M.D.   On: 09/19/2019 16:01   ECHOCARDIOGRAM COMPLETE  Result Date: 09/18/2019    ECHOCARDIOGRAM REPORT   Patient Name:   Andrew Perez Date of Exam: 09/18/2019 Medical Rec #:  [id number]     Height:       71.0 in Accession #:    [id number]    Weight:       418.4 lb Date of Birth:  1965-09-06     BSA:  2.886 m Patient Age:    89 years      BP:           124/54 mmHg Patient Gender: M             HR:           62 bpm. Exam Location:  Inpatient Procedure: 2D Echo, Color Doppler, Cardiac Doppler and Intracardiac            Opacification Agent Indications:    Stroke i163.9  History:        Patient has prior history of Echocardiogram examinations, most                 recent 06/28/2016. Risk Factors:Hypertension, Dyslipidemia and                 Sleep Apnea.  Sonographer:    Raquel Sarna Senior RDCS Referring Phys: [id number] ASHISH ARORA  Sonographer Comments: Technically difficult study due to patient body habitus and poor echo windows. IMPRESSIONS  1. Left ventricular ejection fraction, by estimation, is 55 to 60%. The left ventricle has normal function. The left ventricle has no  regional wall motion abnormalities. There is moderate left ventricular hypertrophy. Left ventricular diastolic parameters are consistent with Grade II diastolic dysfunction (pseudonormalization).  2. Right ventricular systolic function is normal. The right ventricular size is mildly enlarged. Tricuspid regurgitation signal is inadequate for assessing PA pressure.  3. Left atrial size was moderately dilated.  4. Right atrial size was mildly dilated.  5. The mitral valve is normal in structure. No evidence of mitral valve regurgitation. No evidence of mitral stenosis.  6. The aortic valve is tricuspid. Aortic valve regurgitation is not visualized. No aortic stenosis is present.  7. Aortic dilatation noted. There is mild dilatation of the ascending aorta measuring 39 mm.  8. The inferior vena cava is dilated in size with <50% respiratory variability, suggesting right atrial pressure of 15 mmHg.  9. Technically difficult study with poor acoustic windows. FINDINGS  Left Ventricle: Left ventricular ejection fraction, by estimation, is 55 to 60%. The left ventricle has normal function. The left ventricle has no regional wall motion abnormalities. Definity contrast agent was given IV to delineate the left ventricular  endocardial borders. The left ventricular internal cavity size was normal in size. There is moderate left ventricular hypertrophy. Left ventricular diastolic parameters are consistent with Grade II diastolic dysfunction (pseudonormalization). Right Ventricle: The right ventricular size is mildly enlarged. No increase in right ventricular wall thickness. Right ventricular systolic function is normal. Tricuspid regurgitation signal is inadequate for assessing PA pressure. Left Atrium: Left atrial size was moderately dilated. Right Atrium: Right atrial size was mildly dilated. Pericardium: There is no evidence of pericardial effusion. Mitral Valve: The mitral valve is normal in structure. No evidence of mitral  valve regurgitation. No evidence of mitral valve stenosis. Tricuspid Valve: The tricuspid valve is normal in structure. Tricuspid valve regurgitation is not demonstrated. Aortic Valve: The aortic valve is tricuspid. Aortic valve regurgitation is not visualized. No aortic stenosis is present. Pulmonic Valve: The pulmonic valve was normal in structure. Pulmonic valve regurgitation is not visualized. Aorta: Aortic dilatation noted. There is mild dilatation of the ascending aorta measuring 39 mm. Venous: The inferior vena cava is dilated in size with less than 50% respiratory variability, suggesting right atrial pressure of 15 mmHg. IAS/Shunts: The interatrial septum was not well visualized.  LEFT VENTRICLE PLAX 2D LVIDd:         5.47 cm  Diastology LVIDs:         2.83 cm  LV e' lateral:   7.72 cm/s LV PW:         1.87 cm  LV E/e' lateral: 14.6 LV IVS:        1.67 cm  LV e' medial:    7.40 cm/s LVOT diam:     2.30 cm  LV E/e' medial:  15.3 LV SV:         94 LV SV Index:   33 LVOT Area:     4.15 cm  RIGHT VENTRICLE RV S prime:     16.90 cm/s TAPSE (M-mode): 3.2 cm LEFT ATRIUM              Index       RIGHT ATRIUM           Index LA diam:        3.50 cm  1.21 cm/m  RA Area:     25.10 cm LA Vol (A2C):   105.0 ml 36.39 ml/m RA Volume:   78.70 ml  27.27 ml/m LA Vol (A4C):   149.0 ml 51.64 ml/m LA Biplane Vol: 138.0 ml 47.82 ml/m  AORTIC VALVE LVOT Vmax:   112.00 cm/s LVOT Vmean:  70.000 cm/s LVOT VTI:    0.227 m  AORTA Ao Root diam: 3.60 cm Ao Asc diam:  3.85 cm MITRAL VALVE MV Area (PHT): 2.80 cm     SHUNTS MV Decel Time: 271 msec     Systemic VTI:  0.23 m MV E velocity: 113.00 cm/s  Systemic Diam: 2.30 cm MV A velocity: 112.00 cm/s MV E/A ratio:  1.01 Loralie Champagne MD Electronically signed by Loralie Champagne MD Signature Date/Time: 09/18/2019/4:52:31 PM    Final    IR ANGIO INTRA EXTRACRAN SEL COM CAROTID INNOMINATE BILAT MOD SED  Result Date: 09/19/2019 INDICATION: 54 year old male with past medical history  significant for hypertension, 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 He presented with acute onset of posterior headache, dizziness and vomiting on 09/17/2019. 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 Given recurrence of hemorrhage in the same location, further investigation with a diagnostic cerebral angiogram was recommended. EXAM: Diagnostic cerebral angiogram COMPARISON:  CT angiogram of the head July 01, 2016 MEDICATIONS: No antibiotics given. ANESTHESIA/SEDATION: Versed 0.5 mg IV; Fentanyl 50 mcg IV Moderate Sedation Time:  60 The patient was continuously monitored during the procedure by the interventional radiology nurse under my direct supervision. FLUOROSCOPY TIME:  Fluoroscopy Time: 14 minutes 30 seconds (1656 mGy). COMPLICATIONS: None immediate. TECHNIQUE: Informed written consent was obtained from the patient after a thorough discussion of the procedural risks, benefits and alternatives. All questions were addressed. Maximal Sterile Barrier Technique was utilized including caps, mask, sterile gowns, sterile gloves, sterile drape, hand hygiene and skin antiseptic. A timeout was performed prior to the initiation of the procedure. Using the modified Seldinger technique and a micropuncture kit, access was gained to the right radial artery at the wrist and a 5 French sheath was placed. Slow intra arterial infusion of 5,000 IU heparin, 5 mg Verapamil and 970 mcg nitroglicerin diluted in patient's own blood was performed. No significant fluctuation in patient's blood pressure seen. Then, a right radial artery angiogram was obtained via sheath side port. Normal brachial artery branching pattern seen. No significant anatomical variation. The right radial artery caliber is adequate for vascular access. Then, a 5 [place] Simmons 2 glide catheter was navigated  over a 0.035 inch Terumo Glidewire into the right  subclavian artery. A right subclavian artery roadmap was obtained. The catheter was then advanced into the V2 segment of the right vertebral artery. Townes, lateral and bilateral magnified oblique views of the head were obtained centered on the posterior fossa. The catheter was then placed into the aortic arch and then advanced into the left common carotid artery. Left anterior oblique view of the neck was obtained followed by frontal, lateral and bilateral oblique views of the head. The catheter was then placed into the left subclavian artery. Frontal and lateral angiograms of the head were obtained followed by frontal the angiogram of the neck. The catheter was subsequently placed in the right common carotid artery. Right anterior oblique view of the neck was obtained. Following, frontal, lateral and bilateral oblique views of the head were obtained. The catheter was subsequently withdrawn. The right radial artery sheath was removed and inflatable band was placed over the access site for patent hemostasis. FINDINGS: Right radial artery ultrasound and right radial artery angiogram: The caliber of the distal right radial artery is appropriate for angiogram access. The right radial artery and the right ulnar artery have normal course and caliber. No significant anatomical variants noted. Right vertebral artery angiograms: The right vertebral artery, basilar artery, and bilateral posterior cerebral arteries are unremarkable. 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 No abnormally high-flow, early draining veins are seen. No regions of abnormal hypervascularity are noted. The visualized dural sinuses are patent. Left CCA angiograms: Cervical angiograms show normal course and caliber of the visualized left common carotid and internal carotid arteries. There are no significant stenoses. There is brisk vascular contrast  filling of the the ACA and MCA vascular trees. Luminal caliber is smooth and tapering. No aneurysms or abnormally high-flow, early draining veins are seen. No regions of abnormal hypervascularity are noted. The intracranial branches of the left external carotid artery are unremarkable. The visualized dural sinuses are patent. Left subclavian and thyrocervical angiograms: The visualized subclavian artery, proximal left vertebral artery and visualized branches of the thyrocervical trunk are unremarkable. Suboptimal opacification of the intracranial vasculature obtained. The left vertebral artery is codominant and of normal caliber. Right CCA angiograms: Cervical angiograms show normal course and caliber of the visualized right common carotid and internal carotid arteries. There are no significant stenoses. There is brisk vascular contrast filling of the ACA and MCA vascular trees. Luminal caliber is smooth and tapering. No aneurysms or abnormally high-flow, early draining veins are seen. No regions of abnormal hypervascularity are noted. The intracranial branches of the right external carotid artery are unremarkable. The visualized dural sinuses are patent. PROCEDURE: Not applicable. 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 PLAN: Angiogram findings discussed with the patient and his wife as well as with Dr. Erlinda Hong. Planned for angiogram with aneurysm embolization tomorrow. Electronically Signed   By: Pedro Earls M.D.   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 He presented with acute onset of posterior headache,  dizziness and vomiting on 09/17/2019. 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 Given recurrence of hemorrhage  in the same location, further investigation with a diagnostic cerebral angiogram was recommended. EXAM: Diagnostic cerebral angiogram COMPARISON:  CT angiogram of the head July 01, 2016 MEDICATIONS: No antibiotics given. ANESTHESIA/SEDATION: Versed 0.5 mg IV; Fentanyl 50 mcg IV Moderate Sedation Time:  60 The patient was continuously monitored during the procedure by the interventional radiology nurse under my direct supervision. FLUOROSCOPY TIME:  Fluoroscopy Time: 14 minutes 30 seconds (1656 mGy). COMPLICATIONS: None immediate. TECHNIQUE: Informed written consent was obtained from the patient after a thorough discussion of the procedural risks, benefits and alternatives. All questions were addressed. Maximal Sterile Barrier Technique was utilized including caps, mask, sterile gowns, sterile gloves, sterile drape, hand hygiene and skin antiseptic. A timeout was performed prior to the initiation of the procedure. Using the modified Seldinger technique and a micropuncture kit, access was gained to the right radial artery at the wrist and a 5 French sheath was placed. Slow intra arterial infusion of 5,000 IU heparin, 5 mg Verapamil and 341 mcg nitroglicerin diluted in patient's own blood was performed. No significant fluctuation in patient's blood pressure seen. Then, a right radial artery angiogram was obtained via sheath side port. Normal brachial artery branching pattern seen. No significant anatomical variation. The right radial artery caliber is adequate for vascular access. Then, a 5 [place] Simmons 2 glide catheter was navigated over a 0.035 inch Terumo Glidewire into the right subclavian artery. A right subclavian artery roadmap was obtained. The catheter was then advanced into the V2 segment of the right vertebral artery. Townes, lateral and bilateral magnified  oblique views of the head were obtained centered on the posterior fossa. The catheter was then placed into the aortic arch and then advanced into the left common carotid artery. Left anterior oblique view of the neck was obtained followed by frontal, lateral and bilateral oblique views of the head. The catheter was then placed into the left subclavian artery. Frontal and lateral angiograms of the head were obtained followed by frontal the angiogram of the neck. The catheter was subsequently placed in the right common carotid artery. Right anterior oblique view of the neck was obtained. Following, frontal, lateral and bilateral oblique views of the head were obtained. The catheter was subsequently withdrawn. The right radial artery sheath was removed and inflatable band was placed over the access site for patent hemostasis. FINDINGS: Right radial artery ultrasound and right radial artery angiogram: The caliber of the distal right radial artery is appropriate for angiogram access. The right radial artery and the right ulnar artery have normal course and caliber. No significant anatomical variants noted. Right vertebral artery angiograms: The right vertebral artery, basilar artery, and bilateral posterior cerebral arteries are unremarkable. 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 No abnormally high-flow, early draining veins are seen. No regions of abnormal hypervascularity are noted. The visualized dural sinuses are patent. Left CCA angiograms: Cervical angiograms show normal course and caliber of the visualized left common carotid and internal carotid arteries. There are no significant stenoses. There is brisk vascular contrast filling of the the ACA and MCA vascular trees. Luminal caliber is smooth and tapering. No aneurysms or abnormally high-flow, early draining veins are seen. No regions of abnormal  hypervascularity are noted. The intracranial branches of the left external carotid artery are unremarkable. The visualized dural sinuses are patent. Left subclavian and thyrocervical angiograms: The visualized subclavian artery, proximal left vertebral artery and visualized branches of the thyrocervical  trunk are unremarkable. Suboptimal opacification of the intracranial vasculature obtained. The left vertebral artery is codominant and of normal caliber. Right CCA angiograms: Cervical angiograms show normal course and caliber of the visualized right common carotid and internal carotid arteries. There are no significant stenoses. There is brisk vascular contrast filling of the ACA and MCA vascular trees. Luminal caliber is smooth and tapering. No aneurysms or abnormally high-flow, early draining veins are seen. No regions of abnormal hypervascularity are noted. The intracranial branches of the right external carotid artery are unremarkable. The visualized dural sinuses are patent. PROCEDURE: Not applicable. 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 PLAN: Angiogram findings discussed with the patient and his wife as well as with Dr. Erlinda Hong. Planned for angiogram with aneurysm embolization tomorrow. Electronically Signed   By: Pedro Earls M.D.   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 He presented with acute onset of posterior headache, dizziness and vomiting on 09/17/2019. 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 Diagnostic cerebral angiogram performed on 09/18/2019 showed a dissected right PICA  aneurysm, most likely the source of bleed. EXAM: DIAGNOSTIC CEREBRAL ANGIOGRAM AND ENDOVASCULAR EMBOLIZATION COMPARISON:  Diagnostic cerebral angiogram 09/18/2019 MEDICATIONS: No antibiotic administered. ANESTHESIA/SEDATION: The procedure was performed under general anesthesia. FLUOROSCOPY TIME:  Fluoroscopy Time: 74 minutes 42 seconds (5436 mGy). COMPLICATIONS: SIR LEVEL B - Normal therapy, includes overnight admission for observation. TECHNIQUE: Informed written consent was obtained from the patient after a thorough discussion of the procedural risks, benefits and alternatives. All questions were addressed. Maximal Sterile Barrier Technique was utilized including caps, mask, sterile gowns, sterile gloves, sterile drape, hand hygiene and skin antiseptic. A timeout was performed prior to the initiation of the procedure. Real-time ultrasound guidance was utilized for vascular access including the acquisition of a permanent ultrasound image documenting patency of the accessed vessel. Using the modified Seldinger technique and a micropuncture kit, access was gained to the right radial artery at the wrist and a 6 French sheath was placed. Slow intra arterial infusion of 5,000 IU heparin, 5 mg Verapamil and 200 mcg nitroglycerin diluted in patient's own blood was performed. No significant fluctuation in patient's blood pressure seen. Then, a right radial artery angiogram was obtained via sheath side port. Normal brachial artery branching pattern seen. No significant anatomical variation. The right radial artery caliber is adequate for vascular access. Then, a 5 [place] Simmons 2 glide catheter was navigated over a 0.035 inch Terumo Glidewire into the right subclavian artery. A right subclavian artery roadmap was obtained. The catheter was then advanced into the V2 segment of the right vertebral artery. Townes and lateral views of the head were obtained centered on the posterior fossa. 3D rotational angiograms were  acquired and post processed in a separate workstation under concurrent attending physician supervision. Selected images were sent to PACS. FINDINGS: Again seen is a 5 mm dissecting aneurysm as seen on prior angiogram. However, it appears that this aneurysm originates from a dissected branch off of the telovelotonsillar segment of the right posterior inferior cerebellar artery (PICA). The right vertebral artery, basilar artery, and bilateral posterior cerebral arteries are unremarkable. No abnormally high-flow, early draining veins are seen. No regions of abnormal  hypervascularity are noted. The visualized dural sinuses are patent. PROCEDURE: Under biplane roadmap, the Simmons 2 catheter was removed over the wire. Intra arterial infusion of 5000 units of heparin, 200 mcg of nitroglycerin and 5 mg of verapamil was performed to the right radial artery via sheath side port. Then, a benchmark guide catheter was advanced over the wire and under fluoroscopic guidance into the distal V2 segment of the right vertebral artery. Magnified frontal and lateral views of the head were obtained in the working projections. Using biplane roadmap, a headway duo microcatheter was coaxially advanced over a synchro 2 micro guidewire into the right vertebral artery and then navigated into the right posteroinferior cerebellar artery. Frontal and lateral angiograms were obtained with microcatheter contrast injection. The microcatheter was advanced near the origin of the target vessel. Attempted placement of a 2 mm x 4 cm Target 360 Nano coil proved unsuccessful. Follow-up right vertebral artery angiograms with magnified frontal and lateral views of the head were projections obtained. The microcatheter was then advanced and wedged at the origin of the right targeted PICA branch. The microcatheter was then prepped with DMSO. Following, slow injection of onyx 18 was performed under fluoroscopy. Embolization of the target vessel achieved. Small  reflux into the PICA noted. The catheter was removed under constant aspiration. Follow-up angiogram showed occlusion of PICA at the telovelotonsillar segment. Right vertebral artery angiograms with frontal and lateral views of the entire head to evidence of distal thromboembolic complication it. Flat panel CT of the head was obtained and post processed in a separate workstation with concurrent attending physician supervision. Selected images were sent to PACS. No evidence of hemorrhagic complication noted. The catheter was subsequently withdrawn. The right radial sheath was removed and inflatable band was placed over the access site for patent hemostasis. 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 PLAN: 1. Continued observation in ICU. 2. Stat head CT for any neurological deterioration. Patient and spouse communicated in person. Electronically Signed   By: Pedro Earls M.D.   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 He presented with acute onset of posterior headache, dizziness and vomiting on 09/17/2019. 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 Given recurrence of hemorrhage in the same location, further investigation with a diagnostic cerebral angiogram was recommended. EXAM: Diagnostic cerebral angiogram COMPARISON:  CT angiogram of the head July 01, 2016 MEDICATIONS: No antibiotics given. ANESTHESIA/SEDATION: Versed 0.5 mg IV; Fentanyl 50 mcg IV Moderate Sedation Time:  60 The patient was continuously monitored during the procedure by the interventional radiology nurse under my direct supervision. FLUOROSCOPY TIME:  Fluoroscopy Time:  14 minutes 30 seconds (1656 mGy). COMPLICATIONS: None immediate. TECHNIQUE: Informed written consent was obtained from the patient after a thorough discussion of the procedural risks, benefits and alternatives. All questions were addressed. Maximal Sterile Barrier Technique was utilized including caps, mask, sterile gowns, sterile gloves, sterile drape, hand hygiene and skin antiseptic. A timeout was performed prior to the initiation of the procedure. Using the modified Seldinger technique and a micropuncture kit, access was gained to the right radial artery at the wrist and a 5 French sheath was placed. Slow intra arterial infusion of 5,000 IU heparin, 5 mg Verapamil and 200 mcg  nitroglicerin diluted in patient's own blood was performed. No significant fluctuation in patient's blood pressure seen. Then, a right radial artery angiogram was obtained via sheath side port. Normal brachial artery branching pattern seen. No significant anatomical variation. The right radial artery caliber is adequate for vascular access. Then, a 5 [place] Simmons 2 glide catheter was navigated over a 0.035 inch Terumo Glidewire into the right subclavian artery. A right subclavian artery roadmap was obtained. The catheter was then advanced into the V2 segment of the right vertebral artery. Townes, lateral and bilateral magnified oblique views of the head were obtained centered on the posterior fossa. The catheter was then placed into the aortic arch and then advanced into the left common carotid artery. Left anterior oblique view of the neck was obtained followed by frontal, lateral and bilateral oblique views of the head. The catheter was then placed into the left subclavian artery. Frontal and lateral angiograms of the head were obtained followed by frontal the angiogram of the neck. The catheter was subsequently placed in the right common carotid artery. Right anterior oblique view of the neck was obtained. Following, frontal, lateral  and bilateral oblique views of the head were obtained. The catheter was subsequently withdrawn. The right radial artery sheath was removed and inflatable band was placed over the access site for patent hemostasis. FINDINGS: Right radial artery ultrasound and right radial artery angiogram: The caliber of the distal right radial artery is appropriate for angiogram access. The right radial artery and the right ulnar artery have normal course and caliber. No significant anatomical variants noted. Right vertebral artery angiograms: The right vertebral artery, basilar artery, and bilateral posterior cerebral arteries are unremarkable. 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 No abnormally high-flow, early draining veins are seen. No regions of abnormal hypervascularity are noted. The visualized dural sinuses are patent. Left CCA angiograms: Cervical angiograms show normal course and caliber of the visualized left common carotid and internal carotid arteries. There are no significant stenoses. There is brisk vascular contrast filling of the the ACA and MCA vascular trees. Luminal caliber is smooth and tapering. No aneurysms or abnormally high-flow, early draining veins are seen. No regions of abnormal hypervascularity are noted. The intracranial branches of the left external carotid artery are unremarkable. The visualized dural sinuses are patent. Left subclavian and thyrocervical angiograms: The visualized subclavian artery, proximal left vertebral artery and visualized branches of the thyrocervical trunk are unremarkable. Suboptimal opacification of the intracranial vasculature obtained. The left vertebral artery is codominant and of normal caliber. Right CCA angiograms: Cervical angiograms show normal course and caliber of the visualized right common carotid and internal carotid arteries. There are no significant  stenoses. There is brisk vascular contrast filling of the ACA and MCA vascular trees. Luminal caliber is smooth and tapering. No aneurysms or abnormally high-flow, early draining veins are seen. No regions of abnormal hypervascularity are noted. The intracranial branches of the right external carotid artery are unremarkable. The visualized dural sinuses are patent. PROCEDURE: Not applicable. 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 PLAN: Angiogram findings discussed with the patient and his wife as well as with Dr. Erlinda Hong. Planned for angiogram with aneurysm embolization tomorrow. Electronically Signed   By: Pedro Earls M.D.   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 He presented with acute onset of posterior headache, dizziness and vomiting on 09/17/2019. 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 EXAM: DIAGNOSTIC CEREBRAL ANGIOGRAM AND ENDOVASCULAR EMBOLIZATION COMPARISON:  Diagnostic cerebral angiogram 09/18/2019 MEDICATIONS: No antibiotic administered. ANESTHESIA/SEDATION: The procedure was performed under general anesthesia. FLUOROSCOPY TIME:  Fluoroscopy Time: 74 minutes 42 seconds (5436 mGy). COMPLICATIONS: SIR LEVEL B - Normal therapy, includes overnight admission for observation. TECHNIQUE: Informed written consent was obtained from the patient after a thorough discussion of the procedural risks, benefits and alternatives. All questions were addressed. Maximal  Sterile Barrier Technique was utilized including caps, mask, sterile gowns, sterile gloves, sterile drape, hand hygiene and skin antiseptic. A timeout was performed prior to the initiation of the procedure. Real-time ultrasound guidance was utilized for vascular access including the acquisition of a permanent ultrasound image documenting patency of the accessed vessel. Using the modified Seldinger technique and a micropuncture kit, access was gained to the right radial artery at the wrist and a 6 French sheath was placed. Slow intra arterial infusion of 5,000 IU heparin, 5 mg Verapamil and 200 mcg nitroglycerin diluted in patient's own blood was performed. No significant fluctuation in patient's blood pressure seen. Then, a right radial artery angiogram was obtained via sheath side port. Normal brachial artery branching pattern seen. No significant anatomical variation. The right radial artery caliber is adequate for vascular access. Then, a 5 [place] Simmons 2 glide catheter was navigated over a 0.035 inch Terumo Glidewire into the right subclavian artery. A right subclavian artery roadmap was obtained. The catheter was then advanced into the V2 segment of the right vertebral artery. Townes and lateral views of the head were obtained centered on the posterior fossa. 3D rotational angiograms were acquired and post processed in a separate workstation under concurrent attending physician supervision. Selected images were sent to PACS. FINDINGS: Again seen is a 5 mm dissecting aneurysm as seen on prior angiogram. However, it appears that this aneurysm originates from a dissected branch off of the telovelotonsillar segment of the right posterior inferior cerebellar artery (PICA). The right vertebral artery, basilar artery, and bilateral posterior cerebral arteries are unremarkable. No abnormally high-flow, early draining veins are seen. No regions of abnormal hypervascularity are noted. The visualized dural sinuses are  patent. PROCEDURE: Under biplane roadmap, the Simmons 2 catheter was removed over the wire. Intra arterial infusion of 5000 units of heparin, 200 mcg of nitroglycerin and 5 mg of verapamil was performed to the right radial artery via sheath side port. Then, a benchmark guide catheter was advanced over the wire and under fluoroscopic guidance into the distal V2 segment of the right vertebral artery. Magnified frontal and lateral views of the head were obtained in the working projections. Using biplane roadmap, a headway duo microcatheter was coaxially advanced over a synchro 2 micro guidewire into the right vertebral artery and then navigated into the right posteroinferior cerebellar artery. Frontal and lateral angiograms were obtained with microcatheter contrast injection. The microcatheter was advanced near the origin of the target vessel. Attempted placement of a 2 mm x 4 cm Target 360 Nano coil proved unsuccessful. Follow-up right vertebral artery angiograms with magnified frontal and lateral views of the head were projections obtained. The  microcatheter was then advanced and wedged at the origin of the right targeted PICA branch. The microcatheter was then prepped with DMSO. Following, slow injection of onyx 18 was performed under fluoroscopy. Embolization of the target vessel achieved. Small reflux into the PICA noted. The catheter was removed under constant aspiration. Follow-up angiogram showed occlusion of PICA at the telovelotonsillar segment. Right vertebral artery angiograms with frontal and lateral views of the entire head to evidence of distal thromboembolic complication it. Flat panel CT of the head was obtained and post processed in a separate workstation with concurrent attending physician supervision. Selected images were sent to PACS. No evidence of hemorrhagic complication noted. The catheter was subsequently withdrawn. The right radial sheath was removed and inflatable band was placed over the  access site for patent hemostasis. 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 PLAN: 1. Continued observation in ICU. 2. Stat head CT for any neurological deterioration. Patient and spouse communicated in person. Electronically Signed   By: Pedro Earls M.D.   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 Karenann Cai. Dr. Katherina Right Rodrgiues at bedside to discuss procedure findings/outcomes. All questions answered and concerns addressed. Right radial incision stable, radial pulses 2+ bilaterally. Begin taking Aspirin 325 mg once daily- ok to discontinue IV Heparin once patient receives dose of Aspirin today. BP goal = 130-160 for 24 hours post-procedure. Further plans per neurology- appreciate and agree with management. NIR to follow.   Electronically Signed: Earley Abide, PA-C 09/20/2019, 9:38 AM   I spent a total of 25 Minutes at the the patient's bedside AND on the patient's hospital floor or unit, greater than 50% of which was counseling/coordinating care for dissecting right PICA aneurysm s/p embolization.

## 2019-09-20 NOTE — Progress Notes (Signed)
Clarified with Dr. Erlinda Hong, goal SBP<160 until we can readdress tomorrow.

## 2019-09-20 NOTE — Progress Notes (Signed)
STROKE TEAM PROGRESS NOTE   INTERVAL HISTORY Wife is at bedside. Pt awake alert but mildly lethargic.  Denies headache, nystagmus and facial droop resolved.  Early this morning, patient had one episode of nauseous, CT showed bilateral cerebellar infarct right more than left.  No hydrocephalus.  Patient currently denies any headache, nausea, vomiting or discomfort.  MRI brain pending  Vitals:   09/20/19 1815 09/20/19 1830 09/20/19 1845 09/20/19 1900  BP: (!) 145/61 133/60 (!) 142/56 (!) 144/62  Pulse: 70 61 61 65  Resp: 18 17 16 17   Temp:      TempSrc:      SpO2: 100% 98% 99% 98%  Weight:      Height:       CBC:  Recent Labs  Lab 09/19/19 0515 09/20/19 1049  WBC 7.4 14.6*  HGB 11.7* 12.0*  HCT 36.2* 35.9*  MCV 90.5 89.1  PLT 216 638   Basic Metabolic Panel:  Recent Labs  Lab 09/19/19 0515 09/20/19 1049  NA 141 137  K 4.1 3.8  CL 108 106  CO2 26 23  GLUCOSE 97 136*  BUN 14 17  CREATININE 0.94 0.88  CALCIUM 9.1 8.9   Lipid Panel:     Component Value Date/Time   CHOL 159 09/19/2019 0515   TRIG 100 09/19/2019 0515   HDL 35 (L) 09/19/2019 0515   CHOLHDL 4.5 09/19/2019 0515   VLDL 20 09/19/2019 0515   LDLCALC 104 (H) 09/19/2019 0515   HgbA1c:  Lab Results  Component Value Date   HGBA1C 4.7 (L) 09/18/2019   Urine Drug Screen:     Component Value Date/Time   LABOPIA POSITIVE (A) 09/17/2019 2126   COCAINSCRNUR NONE DETECTED 09/17/2019 2126   LABBENZ NONE DETECTED 09/17/2019 2126   AMPHETMU NONE DETECTED 09/17/2019 2126   THCU NONE DETECTED 09/17/2019 2126   LABBARB NONE DETECTED 09/17/2019 2126    Alcohol Level No results found for: ETH  IMAGING past 24 hours CT HEAD WO CONTRAST  Result Date: 09/20/2019 CLINICAL DATA:  Stroke follow-up EXAM: CT HEAD WITHOUT CONTRAST TECHNIQUE: Contiguous axial images were obtained from the base of the skull through the vertex without intravenous contrast. COMPARISON:  Two days ago FINDINGS: Brain: Right more than left  inferior cerebellar infarction. Onyx is seen at the level of vessel embolization. Narrowing of the lower fourth ventricle without hydrocephalus. There is small volume intraventricular hemorrhage which is not progressed. Vascular: As above Skull: No acute or aggressive finding. Sinuses/Orbits: Right mastoid and middle ear opacification. 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 No hydrocephalus or interval hemorrhage. Electronically Signed   By: Monte Fantasia M.D.   On: 09/20/2019 07:13   MR BRAIN WO CONTRAST  Result Date: 09/20/2019 CLINICAL DATA:  Stroke follow-up. EXAM: MRI HEAD WITHOUT CONTRAST TECHNIQUE: Multiplanar, multiecho pulse sequences of the brain and surrounding structures were obtained without intravenous contrast. COMPARISON:  Head CT performed on September 20, 2019 at 6:48 a.m. MRI of the brain June 28, 2016. FINDINGS: Brain: Area of restricted diffusion is seen involving predominantly the right PICA territory with extension to the medial aspect of the left inferior left cerebellar hemisphere, in a similar extension as compared to recent head CT. No brainstem involvement. No hydrocephalus. Susceptibility artifact within the fourth ventricles and in the dependent portion of the bilateral occipital horns represent known intraventricular hemorrhage. More linear appearing susceptibility artifact in the fourth ventricle/inferior aspect of the cerebellar hemisphere corresponds to liquid embolic  cast. Mild increase FLAIR signal within the cerebral sulci may reflect minimal residual subarachnoid hemorrhage from blood redistribution. Unchanged small remote right putaminal infarct and few scattered white matter foci of T2 hyperintensity. Enlarged sella with erosion of the clivus with predominantly T2 hyperintense solid cystic mass lesion and anterior displacement of the right carotid siphon which is encased by the mass, without stenosis.  Compared to prior study, the lesion appear to have increased T2 signal with increased cystic component. Vascular: Normal flow voids, noting that the posteroinferior cerebellar artery is too small to be accurately evaluated with this technique. Skull and upper cervical spine: Normal marrow signal. Sinuses/Orbits: Mucous retention cyst in the left maxillary sinus. Mild mucosal thickening of the ethmoid cells. Other: Small amount of fluid in the right mastoid cells. Decreased diameter of the nasopharynx which may be associated with obstructive sleep apnea. IMPRESSION: Evolving bilateral PICA territory infarct, similar in extension to prior CT. There is no hydrocephalus. These results were called by telephone at the time of interpretation on 09/20/2019 at 1:38 pm to provider [facility] , who verbally acknowledged these results. Electronically Signed   By: Pedro Earls M.D.   On: 09/20/2019 13:38    PHYSICAL EXAM   Temp:  [97.3 F (36.3 C)-97.9 F (36.6 C)] 97.3 F (36.3 C) (06/04 1600) Pulse Rate:  [56-73] 65 (06/04 1900) Resp:  [10-28] 17 (06/04 1900) BP: (133-190)/(49-85) 144/62 (06/04 1900) SpO2:  [87 %-100 %] 98 % (06/04 1900) Arterial Line BP: (143-199)/(51-70) 159/52 (06/04 1115)  General - morbid obesity, well developed, mildly lethargic.  Ophthalmologic - fundi not visualized due to noncooperation.  Cardiovascular - Regular rhythm and rate.  Neuro - awake alert and mildly lethargic, orientated x 3. No aphasia, but paucity of speech, no dysarthria. Able to name and repeat. PERRL, EOMI, no nystagmus on bilateral gaze.  Facial symmetrical. visual field full. Tongue midline. Moving all extremities equally. B/l FTN intact no ataxia. Sensation intact, gait not tested.    ASSESSMENT/PLAN Mr. BRANDUN PINN is a 54 y.o. 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 SBP > 190.  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 small volume IVH in 4th ventricle. Interval resection previous central skull base infiltrative mass. 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 No hydrocephalus. Known sellar mass w/ skull base erosion.   Cerebral angio right PICA 72mm aneurysm   2D Echo EF 55-60%. No source of embolus. LA moderate dilated  LDL 104  HgbA1c 4.7  UDS + opiates  SCDs for VTE prophylaxis  No antithrombotic prior to admission, was on No antithrombotic given hemorrhage. Post aneurysm embolization, now off heparin IV, on ASA 325.   Therapy recommendations:  CIR  Disposition:  Pending  Right PICA aneurysm  Cerebral angiogram showed right PICA 5 mm aneurysm  Aneurysm management with right PICA embolization 6/3  Expecting small right cerebellar infarct  CT bilateral cerebellar infarct, right more than left  MRI confirmed bilateral PICA infarcts, right more than left.  No hydrocephalus  Off heparin IV, on aspirin 325  History of IVH  2018 - admitted for headache, MRI and CT showed IVH involving the 4th ventricle, and old right BG infarct.  Carotid Doppler negative.  EF 55 to 60%.  LDL 27 and A1c 4.6.  CTA head at that time showed right PICA patent.  Prolactinoma    followed by Dr. Maurie Boettcher and endocrinologist at [facility]  on cabergoline therapy since March 2018 with decrease in mass size on follow-up imaging  being followed by surveillance with MRIs  Cabergoline every Wed and Sat  Hypertensive Emergency  SBP > 190 on arrival   Home meds:  amlodipine 10, hydralazine 100 q8, losartan  100  Now on cleviprex - maximized dose  Resumes home medication  Add HCTZ and clonidine  . SBP goal <160    . Long-term BP goal normotensive  Hyperlipidemia  LDL 104  on no statin PTA  Add lipitor 40 in the setting of stroke  Continue statin on discharge.  Other Stroke Risk Factors  Morbid Obesity, Body mass index is 58.36 kg/m., recommend weight loss, diet and exercise as appropriate   Obstructive sleep apnea  Other Active Problems  Hypothyroid on synthroid  Hospital day # 3  Patient continues to be critically ill for the last 24 hours, N/V overnight, stat CT and later MRI showed b/l cerebellar infarcts. On ASA. Pt still has uncontrolled HTN, on maximized dose of cleviprex, currently on 5 BP meds and still adjusting.  I had long discussion with wife at bedside, updated pt current condition, treatment plan and potential prognosis, and answered all the questions.  She expressed understanding and appreciation. I spent 35 minutes of neurocritical care time in the care of this patient. I also discussed with Dr. Norma Fredrickson  This patient is critically ill due to IVH, hypertensive emergency, history of IVH and at significant risk of neurological worsening, death form recurrent IVH, worsening IVH, hydrocephalus, hypertensive encephalopathy, seizure. This patient's care requires constant monitoring of vital signs, hemodynamics, respiratory and cardiac monitoring, review of multiple databases, neurological assessment, discussion with family, other specialists and medical decision making of high complexity.     Rosalin Hawking, MD PhD Stroke Neurology 09/20/2019 7:13 PM  To contact Stroke Continuity provider, please refer to [email or web address] After hours, contact General Neurology

## 2019-09-20 NOTE — Evaluation (Signed)
Speech Language Pathology Evaluation Patient Details Name: Andrew Perez MRN: [id number] DOB: [birth year] Today's Date: 09/20/2019 Time: 7017-7939 SLP Time Calculation (min) (ACUTE ONLY): 16 min  Problem List:  Patient Active Problem List   Diagnosis Date Noted  . Intraventricular hemorrhage ([place]) 09/17/2019  . Hyperlipidemia 07/04/2016  . Hypokalemia 07/04/2016  . Leukocytosis 07/04/2016  . Morbid obesity ([place]) 06/29/2016  . 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 Hypertensive emergency 06/29/2016  . IVH (intraventricular hemorrhage) ([place]) 06/28/2016   Past Medical History:  Past Medical History:  Diagnosis Date  . Hypertension   . Sleep apnea    Past Surgical History:  Past Surgical History:  Procedure Laterality Date  . IR ANGIO INTRA EXTRACRAN SEL COM CAROTID INNOMINATE BILAT MOD SED  09/18/2019  . IR ANGIO VERTEBRAL SEL SUBCLAVIAN INNOMINATE UNI L MOD SED  09/18/2019  . IR ANGIO VERTEBRAL SEL VERTEBRAL UNI R MOD SED  09/18/2019  . IR US GUIDE VASC ACCESS RIGHT  09/18/2019  . RADIOLOGY WITH ANESTHESIA N/A 09/19/2019   Procedure: RADIOLOGY WITH ANESTHESIA ANUERYSM COILING;  Surgeon: Pedro Earls, MD;  Location: [place];  Service: Radiology;  Laterality: N/A;  . TONSILLECTOMY     HPI:  Pt is a 54 yo male admitted with IVH of 4th ventricle. s/p aneurysm clipping on 6/3.  PMH: HTN, morbid obesity, previous IVH 2018, prolactinoma , sleep apnea   Assessment / Plan / Recommendation Clinical Impression  Pt reports feeling a little "slow" and "groggy," but performed [facility] on all testing today and with swift responses. His wife is at home and can provide some precautionary supervision upon return home. No acute SLP needs identified and do not anticipate needs beyond hospital stay, although could consider SLP f/u for higher level cognitive testing should his subjective symptoms persist.      SLP Assessment  SLP Recommendation/Assessment: Patient does not need any further Speech Lanaguage  Pathology Services SLP Visit Diagnosis: Cognitive communication deficit (R41.841)    Follow Up Recommendations  Other (comment)(intermittent supervision upon return home)    Frequency and Duration           SLP Evaluation Cognition  Overall Cognitive Status: Within Functional Limits for tasks assessed Orientation Level: Oriented X4       Comprehension  Auditory Comprehension Overall Auditory Comprehension: Appears within functional limits for tasks assessed    Expression Expression Primary Mode of Expression: Verbal Verbal Expression Overall Verbal Expression: Appears within functional limits for tasks assessed   Oral / Motor  Motor Speech Overall Motor Speech: Appears within functional limits for tasks assessed   GO                     Osie Bond., M.A. [facility] Pager [phone] Office [phone]  09/20/2019, 9:21 AM

## 2019-09-20 NOTE — Progress Notes (Signed)
Inpatient Rehab Admissions Coordinator Note:   Per PT recommendations, pt was screened for CIR candidacy by Shann Medal, PT, DPT.  At this time we are recommending CIR consult.  I will place an order per our protocol.  Please contact me with questions.   Shann Medal, PT, DPT [phone] 09/20/19 5:15 PM

## 2019-09-20 NOTE — Anesthesia Postprocedure Evaluation (Signed)
Anesthesia Post Note  Patient: Andrew Perez  Procedure(s) Performed: RADIOLOGY WITH ANESTHESIA ANUERYSM COILING (N/A )     Patient location during evaluation: PACU Anesthesia Type: General Level of consciousness: sedated and patient cooperative Pain management: pain level controlled Vital Signs Assessment: post-procedure vital signs reviewed and stable Respiratory status: spontaneous breathing Cardiovascular status: stable Anesthetic complications: no    Last Vitals:  Vitals:   09/20/19 1945 09/20/19 2000  BP: (!) 161/71 (!) 151/67  Pulse: 63 62  Resp: 18 16  Temp:  (!) 36.3 C  SpO2: 100% 99%    Last Pain:  Vitals:   09/20/19 2000  TempSrc: Oral  PainSc: [place]

## 2019-09-21 LAB — CBC
HCT: 36.9 % — ABNORMAL LOW (ref 39.0–52.0)
Hemoglobin: 12.3 g/dL — ABNORMAL LOW (ref 13.0–17.0)
MCH: 29.7 pg (ref 26.0–34.0)
MCHC: 33.3 g/dL (ref 30.0–36.0)
MCV: 89.1 fL (ref 80.0–100.0)
Platelets: 271 10*3/uL (ref 150–400)
RBC: 4.14 MIL/uL — ABNORMAL LOW (ref 4.22–5.81)
RDW: 15.2 % (ref 11.5–15.5)
WBC: 14.4 10*3/uL — ABNORMAL HIGH (ref 4.0–10.5)
nRBC: 0.2 % (ref 0.0–0.2)

## 2019-09-21 LAB — BASIC METABOLIC PANEL
Anion gap: 8 (ref 5–15)
BUN: 17 mg/dL (ref 6–20)
CO2: 24 mmol/L (ref 22–32)
Calcium: 8.8 mg/dL — ABNORMAL LOW (ref 8.9–10.3)
Chloride: 101 mmol/L (ref 98–111)
Creatinine, Ser: 0.87 mg/dL (ref 0.61–1.24)
GFR calc Af Amer: >60 mL/min (ref >60)
GFR calc non Af Amer: >60 mL/min (ref >60)
Glucose, Bld: 136 mg/dL — ABNORMAL HIGH (ref 70–99)
Potassium: 4 mmol/L (ref 3.5–5.1)
Sodium: 133 mmol/L — ABNORMAL LOW (ref 135–145)

## 2019-09-21 MED ORDER — HYDRALAZINE HCL 20 MG/ML IJ SOLN
10.0000 mg | INTRAMUSCULAR | Status: DC | PRN
  Administered 2019-09-21 – 2019-09-22 (×3): 10 mg via INTRAVENOUS
  Filled 2019-09-21 (×3): qty 1

## 2019-09-21 MED ORDER — CLONIDINE HCL 0.2 MG PO TABS
0.2000 mg | ORAL_TABLET | Freq: Three times a day (TID) | ORAL | Status: DC
  Administered 2019-09-21 – 2019-09-23 (×6): 0.2 mg via ORAL
  Filled 2019-09-21 (×6): qty 1

## 2019-09-21 NOTE — Progress Notes (Signed)
Occupational Therapy Treatment Patient Details Name: Andrew Perez MRN: [id number] DOB: May 06, 1965 Today's Date: 09/21/2019    History of present illness 54 yo male admitted with IVH of 4th ventricle   Pt underwent embolization of R PICA aneurysm with sacrifice of the right PICA at the  telovelotonsillar segment on 6/3. PMH HTN  morbid obese previous IVH 2018.   OT comments  Recommendation updated to CIR due to patient with cognitive , balance and activity tolerance deficits. Pt holding eyes wide open as if he has been scared but has no awareness to this. Pt noted to have nystagmus with sit to supine. Pt requires increased time to answer questions and reports not being tired but closing eyes. Pt with BP limiting progression in addition to on set of HA with movement.    Follow Up Recommendations  CIR    Equipment Recommendations  Other (comment)(RW)    Recommendations for Other Services Rehab consult    Precautions / Restrictions Precautions Precautions: Fall Precaution Comments: systolic PT<465 Restrictions Weight Bearing Restrictions: No       Mobility Bed Mobility Overal bed mobility: Needs Assistance Bed Mobility: Supine to Sit     Supine to sit: Supervision Sit to supine: Mod assist   General bed mobility comments: modA to return to supine for LE management  Transfers Overall transfer level: Needs assistance Equipment used: Rolling walker (2 wheeled) Transfers: Sit to/from Stand Sit to Stand: Min assist;+2 physical assistance;From elevated surface(modAx2 from recliner)         General transfer comment: pt requires increased assistance to power up from lower seat, verbal cues provided for hand placement    Balance Overall balance assessment: Needs assistance Sitting-balance support: Single extremity supported;Feet supported Sitting balance-Leahy Scale: Fair Sitting balance - Comments: minG at edge of bed   Standing balance support: Bilateral upper extremity  supported Standing balance-Leahy Scale: Poor Standing balance comment: minG-minA for static standing balance with BUE support of RW                           ADL either performed or assessed with clinical judgement   ADL Overall ADL's : Needs assistance/impaired Eating/Feeding: Set up;Sitting Eating/Feeding Details (indicate cue type and reason): pt is extending head and shifting weight to place objects in mouth Grooming: Min guard;Sitting   Upper Body Bathing: Minimal assistance   Lower Body Bathing: Total assistance           Toilet Transfer: +2 for physical assistance;Moderate assistance;Stand-pivot;RW Toilet Transfer Details (indicate cue type and reason): simulated OOB to chair   Toileting - Clothing Manipulation Details (indicate cue type and reason): total     Functional mobility during ADLs: +2 for physical assistance;Moderate assistance;Rolling walker General ADL Comments: pt with decrease balance with RW but reports "its fine"     Vision       Perception     Praxis      Cognition Arousal/Alertness: Awake/alert Behavior During Therapy: Flat affect Overall Cognitive Status: Impaired/Different from baseline Area of Impairment: Attention;Memory;Safety/judgement;Following commands;Awareness;Problem solving                   Current Attention Level: Sustained Memory: Decreased recall of precautions;Decreased short-term memory Following Commands: Follows one step commands inconsistently;Follows one step commands with increased time Safety/Judgement: Decreased awareness of safety;Decreased awareness of deficits Awareness: Intellectual Problem Solving: Slow processing;Decreased initiation;Difficulty sequencing General Comments: pt giving very short brisk responses to open ended questions. pt state  vision is not blurry but then states back of his head is blurry. pt then with more cueing states his vision is blurry until he focuses on the object  then its not blurry . pt reports its been happening. when pushed to define states few days pt then further states it started after the coiling procedure.         Exercises     Shoulder Instructions       General Comments BP pre-mobility 167/74, RN made aware and provides BP meds. BP with transfer to chair 168/68, pt reports 5/10 HA. BP after ambulation 171/78, pt reports HA remains same. BP with return to supine 155/54    Pertinent Vitals/ Pain       Pain Assessment: 0-10 Pain Score: 5  Pain Location: head Pain Descriptors / Indicators: Aching Pain Intervention(s): Monitored during session;Repositioned;Patient requesting pain meds-RN notified  Home Living                                          Prior Functioning/Environment              Frequency           Progress Toward Goals  OT Goals(current goals can now be found in the care plan section)  Progress towards OT goals: Progressing toward goals  Acute Rehab OT Goals Patient Stated Goal: To return to independent mobility OT Goal Formulation: With patient Time For Goal Achievement: 10/05/19 Potential to Achieve Goals: Good ADL Goals Pt Will Perform Upper Body Dressing: with supervision;sitting Pt Will Perform Lower Body Dressing: with supervision;with adaptive equipment;sit to/from stand Pt Will Transfer to Toilet: with supervision;ambulating;regular height toilet  Plan Discharge plan needs to be updated    Co-evaluation    PT/OT/SLP Co-Evaluation/Treatment: Yes Reason for Co-Treatment: Complexity of the patient's impairments (multi-system involvement);To address functional/ADL transfers PT goals addressed during session: Mobility/safety with mobility;Balance;Proper use of DME OT goals addressed during session: ADL's and self-care;Strengthening/ROM;Proper use of Adaptive equipment and DME      AM-PAC OT "6 Clicks" Daily Activity     Outcome Measure   Help from another person eating  meals?: A Little Help from another person taking care of personal grooming?: A Little Help from another person toileting, which includes using toliet, bedpan, or urinal?: A Lot Help from another person bathing (including washing, rinsing, drying)?: A Lot Help from another person to put on and taking off regular upper body clothing?: A Little Help from another person to put on and taking off regular lower body clothing?: A Lot 6 Click Score: 15    End of Session Equipment Utilized During Treatment: Rolling walker  OT Visit Diagnosis: Unsteadiness on feet (R26.81);Muscle weakness (generalized) (M62.81);Pain Pain - part of body: (head)   Activity Tolerance Other (comment)(limited by BP and HA started with movement 5 out 10)   Patient Left in bed;with call bell/phone within reach;with bed alarm set   Nurse Communication Mobility status;Precautions        Time: 7116-5790 OT Time Calculation (min): 23 min  Charges: OT General Charges $OT Visit: 1 Visit OT Treatments $Self Care/Home Management : 8-22 mins   [place], OTR/L  Acute Rehabilitation Services Pager: [phone] Office: [phone] .    Jeri Modena 09/21/2019, 4:36 PM

## 2019-09-21 NOTE — Progress Notes (Signed)
STROKE TEAM PROGRESS NOTE   INTERVAL HISTORY  No family at the bedside. Pt lying in bed, awake alert, denies HA or N/V, slight dizziness with head motion. Still on cleviprex, will increase clonidine dose. PT/OT recommend CIR.    Vitals:   09/21/19 0515 09/21/19 0530 09/21/19 0545 09/21/19 0600  BP:   (!) 147/68 (!) 147/71  Pulse: 66 64 63 64  Resp: 17 15 17 15   Temp:      TempSrc:      SpO2: 95% 94% 95% 94%  Weight:      Height:       CBC:  Recent Labs  Lab 09/20/19 1049 09/21/19 0511  WBC 14.6* 14.4*  HGB 12.0* 12.3*  HCT 35.9* 36.9*  MCV 89.1 89.1  PLT 237 149   Basic Metabolic Panel:  Recent Labs  Lab 09/20/19 1049 09/21/19 0511  NA 137 133*  K 3.8 4.0  CL 106 101  CO2 23 24  GLUCOSE 136* 136*  BUN 17 17  CREATININE 0.88 0.87  CALCIUM 8.9 8.8*   Lipid Panel:     Component Value Date/Time   CHOL 159 09/19/2019 0515   TRIG 100 09/19/2019 0515   HDL 35 (L) 09/19/2019 0515   CHOLHDL 4.5 09/19/2019 0515   VLDL 20 09/19/2019 0515   LDLCALC 104 (H) 09/19/2019 0515   HgbA1c:  Lab Results  Component Value Date   HGBA1C 4.7 (L) 09/18/2019   Urine Drug Screen:     Component Value Date/Time   LABOPIA POSITIVE (A) 09/17/2019 2126   COCAINSCRNUR NONE DETECTED 09/17/2019 2126   LABBENZ NONE DETECTED 09/17/2019 2126   AMPHETMU NONE DETECTED 09/17/2019 2126   THCU NONE DETECTED 09/17/2019 2126   LABBARB NONE DETECTED 09/17/2019 2126    Alcohol Level No results found for: ETH  IMAGING past 24 hours  CT HEAD WO CONTRAST 09/20/2019 IMPRESSION:  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 No hydrocephalus or interval hemorrhage.   MR BRAIN WO CONTRAST 09/20/2019 IMPRESSION:  Evolving bilateral PICA territory infarct, similar in extension to prior CT. There is no hydrocephalus.    PHYSICAL EXAM  Temp:  [97.3 F (36.3 C)-99.5 F (37.5 C)] 99.4 F (37.4 C) (06/05 0400) Pulse Rate:  [57-70] 64 (06/05 0600) Resp:  [10-28] 15 (06/05 0600) BP:  (127-185)/(49-85) 147/71 (06/05 0600) SpO2:  [92 %-100 %] 94 % (06/05 0600) Arterial Line BP: (151-199)/(51-70) 159/52 (06/04 1115)  General - morbid obesity, well developed, not in acute distress.  Ophthalmologic - fundi not visualized due to noncooperation.  Cardiovascular - Regular rhythm and rate.  Neuro - awake alert and mildly lethargic, orientated x 3. No aphasia, but paucity of speech, no dysarthria. Able to name and repeat. PERRL, EOMI, no nystagmus on bilateral gaze.  Facial symmetrical. visual field full. Tongue midline. Moving all extremities equally. B/l FTN intact no ataxia. Sensation intact, gait not tested.    ASSESSMENT/PLAN Andrew Perez is a 54 y.o. 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 SBP > 190.  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 small volume IVH in 4th ventricle. Interval resection previous central skull base  infiltrative mass. 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 No hydrocephalus. Known sellar mass w/ skull base erosion.   Cerebral angio right PICA 71mm aneurysm   MRI - Evolving bilateral PICA territory infarct, similar in extension to prior CT. There is no hydrocephalus.   2D Echo EF 55-60%. No source of embolus. LA moderate dilated  LDL 104  HgbA1c 4.7  UDS + opiates  SCDs for VTE prophylaxis  No antithrombotic prior to admission, was on No antithrombotic given hemorrhage. Post aneurysm embolization, now off heparin IV, on ASA 325.   Therapy recommendations:  CIR  Disposition:   Pending  Right PICA aneurysm  Cerebral angiogram showed right PICA 5 mm aneurysm  Aneurysm management with right PICA embolization 6/3  Expecting small right cerebellar infarct  CT bilateral cerebellar infarct, right more than left  MRI confirmed bilateral PICA infarcts, right more than left.  No hydrocephalus  Off heparin IV, on aspirin 325  History of IVH  2018 - admitted for headache, MRI and CT showed IVH involving the 4th ventricle, and old right BG infarct.  Carotid Doppler negative.  EF 55 to 60%.  LDL 27 and A1c 4.6.  CTA head at that time showed right PICA patent.  Prolactinoma    followed by Dr. Maurie Boettcher and endocrinologist at [facility]  on cabergoline therapy since March 2018 with decrease in mass size on follow-up imaging  being followed by surveillance with MRIs  Cabergoline every Wed and Sat  Hypertensive Emergency  SBP > 190 on arrival   Home meds:  amlodipine 10, hydralazine 100 q8, losartan 100  Now still on cleviprex   Resumed home medication  Add HCTZ 50 and clonidine 0.1 tid -> 0.2 tid . SBP goal <160  . Long-term BP goal normotensive  Hyperlipidemia  LDL 104  on no statin PTA  Add lipitor 40 in the setting of stroke  Continue statin on discharge.  Other Stroke Risk Factors  Morbid Obesity, Body mass index is 58.36 kg/m., recommend weight loss, diet and exercise as appropriate   Obstructive sleep apnea  Other Active Problems  Hypothyroid on synthroid  Leukocytosis - 14.6->14.4 (afebrile)  Mild hyponatremia - [facility] day # 4  Patient continues to be critically ill for the last 24 hours, still on cleviprex for BP management. I have to increased his clonidine dose and close monitor BP and wean of cleviprex as able. I spent 30 minutes of neurocritical care time in the care of this patient.   This patient is critically ill due to IVH, hypertensive emergency, history of IVH and at significant risk of  neurological worsening, death form recurrent IVH, worsening IVH, hydrocephalus, hypertensive encephalopathy, seizure. This patient's care requires constant monitoring of vital signs, hemodynamics, respiratory and cardiac monitoring, review of multiple databases, neurological assessment, discussion with family, other specialists and medical decision making of high complexity.     Rosalin Hawking, MD PhD Stroke Neurology 09/21/2019 11:49 AM   To contact Stroke Continuity provider, please refer to [email or web address] After hours, contact General Neurology

## 2019-09-21 NOTE — Progress Notes (Signed)
Inpatient Rehab Admissions:  Inpatient Rehab Consult received. PT changed follow up recommendations on 6/4 after pt underwent embolization of R PICA aneurysm with sacrifice of right PICA at the telovelotonsillar segment on 6/3.   I met with patient at the bedside for rehabilitation assessment and to discuss goals and expectations of an inpatient rehab admission.  Pt acknowledged understanding of CIR goals and expectations.  Pt appears to be an appropriate candidate for CIR.  Will continue to follow pt's progress with acute therapies and monitor medical workup.  Signed: Gayland Curry, [place], [place] Admissions Coordinator [phone]

## 2019-09-21 NOTE — Progress Notes (Signed)
Referring Physician(s): Amie Portland  Supervising Physician: Pedro Earls  Patient Status:  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 Karenann Cai. Patient awake and alert laying in bed. Complains of headache, rated 2-3/10 at this time, states much improved since yesterday. Denies N/V currently or overnight. Right radial puncture site c/d/i.  MR brain 09/20/2019: 1. Evolving bilateral PICA territory infarct, similar in extension to prior CT. There is no hydrocephalus.   Allergies: Codeine and Shellfish allergy  Medications: Prior to Admission medications   Medication Sig Start Date End Date Taking? Authorizing Provider  amLODipine (NORVASC) 10 MG tablet Take 1 tablet (10 mg total) by mouth daily. 07/04/16  Yes Rinehuls, Early Chars, PA-C  cabergoline (DOSTINEX) 0.5 MG tablet Take 0.5 mg by mouth 2 (two) times a week. Saturday and wednesday 06/24/16  Yes [provider]  cloNIDine (CATAPRES - DOSED IN MG/24 HR) 0.3 mg/24hr patch Place 0.3 mg onto the skin every 7 (seven) days. Saturday 03/12/18  Yes [provider]  hydrALAZINE (APRESOLINE) 100 MG tablet Take 1 tablet (100 mg total) by mouth every 8 (eight) hours. Patient taking differently: Take 100 mg by mouth 3 (three) times daily.  07/03/16  Yes Rinehuls, Early Chars, PA-C  levothyroxine (SYNTHROID, LEVOTHROID) 100 MCG tablet Take 100 mcg by mouth daily. 02/22/18  Yes [provider]  losartan (COZAAR) 100 MG tablet Take 100 mg by mouth daily. 03/20/18  Yes [provider]  cloNIDine (CATAPRES) 0.1 MG tablet One tablet three times daily Patient not taking: Reported on 04/13/2018 07/03/16   Rinehuls, Early Chars, PA-C  lisinopril (PRINIVIL,ZESTRIL) 20 MG tablet Take 1 tablet (20 mg  total) by mouth 2 (two) times daily. Patient not taking: Reported on 04/13/2018 07/03/16   Rinehuls, Early Chars, PA-C     Vital Signs: BP (!) 147/57   Pulse 70   Temp 99.4 F (37.4 C) (Oral)   Resp 17   Ht 5' 11"  (1.803 m)   Wt (!) 418 lb 6.9 oz (189.8 kg)   SpO2 93%   BMI 58.36 kg/m   Physical Exam Vitals and nursing note reviewed.  Constitutional:      General: He is not in acute distress.    Appearance: Normal appearance.  Pulmonary:     Effort: Pulmonary effort is normal. No respiratory distress.  Skin:    General: Skin is warm and dry.     Comments: Right radial puncture site soft without active bleeding or hematoma.  Neurological:     Mental Status: He is alert.     Comments: Alert, awake, and oriented x3. Speech and comprehension intact. PERRL bilaterally. EOMs intact bilaterally without nystagmus. No facial asymmetry. Tongue midline. Can spontaneously move all extremities, hand grip strength equal bilaterally. No pronator drift. Radial pulses 2+ bilaterally.      Imaging: CT HEAD WO CONTRAST  Result Date: 09/20/2019 CLINICAL DATA:  Stroke follow-up EXAM: CT HEAD WITHOUT CONTRAST TECHNIQUE: Contiguous axial images were obtained from the base of the skull through the vertex without intravenous contrast. 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 Onyx is seen at the level of vessel embolization. Narrowing of the lower fourth ventricle without hydrocephalus. There is small volume intraventricular hemorrhage which is not progressed. Vascular: As above  Skull: No acute or aggressive finding. Sinuses/Orbits: Right mastoid and middle ear opacification. 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 No hydrocephalus or interval hemorrhage. Electronically Signed   By: Monte Fantasia M.D.   On: 09/20/2019 07:13   CT HEAD WO CONTRAST  Result Date:  09/18/2019 CLINICAL DATA:  Follow-up subarachnoid hemorrhage EXAM: CT HEAD WITHOUT CONTRAST TECHNIQUE: Contiguous axial images were obtained from the base of the skull through the vertex without intravenous contrast. COMPARISON:  Head CT from yesterday and 06/28/2016 FINDINGS: Brain: Slight interval redistribution of small volume intraventricular hemorrhage seen at the fourth, third, and lateral ventricles. Potentially parenchymal high-density at the roof of the fourth ventricle. Fourth ventricular hemorrhage was seen in 2018 as well. Lytic and low-density skull base mass with well-defined margins and very low-density. 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 No evidence of infarct.  No hydrocephalus. Vascular: Negative by noncontrast CT Skull: Tumoral skull base erosion centered at the sella. Sinuses/Orbits: Negative IMPRESSION: 1. Allowing for redistribution, no convincing increase in the intraventricular hemorrhage. No hydrocephalus. 2. Fourth ventricular hemorrhage was also seen in 2018, recommend updated MRI/MRA to evaluate for underlying lesion. 3. Known sellar mass with skull base erosion. Electronically Signed   By: Monte Fantasia M.D.   On: 09/18/2019 04:36   CT HEAD WO CONTRAST  Result Date: 09/17/2019 CLINICAL DATA:  Headache. 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 EXAM: CT HEAD WITHOUT CONTRAST TECHNIQUE: Contiguous axial images were obtained from the base of the skull through the vertex without intravenous contrast. COMPARISON:  Brain MR 06/28/2016.  CT 06/28/2016. FINDINGS: Brain: Previously identified infiltrative mass lesion at the central skull base appears to have at least been partially resected since previous. Evaluation for residual tumor limited on this noncontrast CT examination. Small amount of hyperdensity seen within the fourth ventricle, suspicious for acute intraventricular hemorrhage (series 3, image 11). This measures up to 1 cm in  maximal diameter. Overall ventricular size and morphology is fairly stable without obstructive hydrocephalus or trapping. No other acute intracranial hemorrhage. No acute large vessel territory infarct. No other mass lesion, mass effect, or midline shift. No extra-axial fluid collection. Vascular: No hyperdense vessel. Scattered vascular calcifications noted within the carotid siphons. Skull: Scalp soft tissues demonstrate no acute abnormality. Calvarium intact. Sinuses/Orbits: Globes and orbital soft tissues within normal limits. Small left maxillary sinus retention cyst noted. Paranasal sinuses are otherwise largely clear. Chronic right mastoid and middle ear effusion noted. IMPRESSION: 1. Small volume acute intraventricular hemorrhage involving the fourth ventricle. No evidence for obstructive hydrocephalus. 2. Suspected at least partial interval resection of previously identified infiltrative mass lesion at the central skull base. Evaluation for residual tumor limited on this noncontrast CT examination. 3. No other acute intracranial abnormality. 4. Chronic right mastoid and middle ear effusion. Critical Value/emergent results were called by telephone at the time of interpretation on 09/17/2019 at 7:38 pm to provider [facility] , who verbally acknowledged these results. Electronically Signed   By: Jeannine Boga M.D.   On: 09/17/2019 19:39   MR BRAIN WO CONTRAST  Result Date: 09/20/2019 CLINICAL DATA:  Stroke follow-up. EXAM: MRI HEAD WITHOUT CONTRAST TECHNIQUE: Multiplanar, multiecho pulse sequences of the brain and surrounding structures were obtained without intravenous contrast. COMPARISON:  Head CT performed on September 20, 2019 at 6:48 a.m. MRI of the brain June 28, 2016. FINDINGS: Brain: Area of restricted diffusion is seen involving predominantly the right PICA territory  with extension to the medial aspect of the left inferior left cerebellar hemisphere, in a similar extension as compared to recent  head CT. No brainstem involvement. No hydrocephalus. Susceptibility artifact within the fourth ventricles and in the dependent portion of the bilateral occipital horns represent known intraventricular hemorrhage. More linear appearing susceptibility artifact in the fourth ventricle/inferior aspect of the cerebellar hemisphere corresponds to liquid embolic cast. Mild increase FLAIR signal within the cerebral sulci may reflect minimal residual subarachnoid hemorrhage from blood redistribution. Unchanged small remote right putaminal infarct and few scattered white matter foci of T2 hyperintensity. Enlarged sella with erosion of the clivus with predominantly T2 hyperintense solid cystic mass lesion and anterior displacement of the right carotid siphon which is encased by the mass, without stenosis. Compared to prior study, the lesion appear to have increased T2 signal with increased cystic component. Vascular: Normal flow voids, noting that the posteroinferior cerebellar artery is too small to be accurately evaluated with this technique. Skull and upper cervical spine: Normal marrow signal. Sinuses/Orbits: Mucous retention cyst in the left maxillary sinus. Mild mucosal thickening of the ethmoid cells. Other: Small amount of fluid in the right mastoid cells. Decreased diameter of the nasopharynx which may be associated with obstructive sleep apnea. IMPRESSION: Evolving bilateral PICA territory infarct, similar in extension to prior CT. There is no hydrocephalus. These results were called by telephone at the time of interpretation on 09/20/2019 at 1:38 pm to provider [facility] , who verbally acknowledged these results. Electronically Signed   By: Pedro Earls M.D.   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 He presented with acute onset of posterior headache, dizziness and vomiting on 09/17/2019. 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 EXAM: DIAGNOSTIC CEREBRAL ANGIOGRAM AND ENDOVASCULAR EMBOLIZATION COMPARISON:  Diagnostic cerebral angiogram 09/18/2019 MEDICATIONS: No antibiotic administered. ANESTHESIA/SEDATION: The procedure was performed under general anesthesia. FLUOROSCOPY TIME:  Fluoroscopy Time: 74 minutes 42 seconds (5436 mGy). COMPLICATIONS: SIR LEVEL B - Normal therapy, includes overnight admission for observation. TECHNIQUE: Informed written consent was obtained from the patient after a thorough discussion of the procedural risks, benefits and alternatives. All questions were addressed. Maximal Sterile Barrier Technique was utilized including caps, mask, sterile gowns, sterile gloves, sterile drape, hand hygiene and skin antiseptic. A timeout was performed prior to the initiation of the procedure. Real-time ultrasound guidance was utilized for vascular access including the acquisition of a permanent ultrasound image documenting patency of the accessed vessel. Using the modified Seldinger technique and a micropuncture kit, access was gained to the right radial artery at the wrist and a 6 French sheath was placed. Slow intra arterial infusion of 5,000 IU heparin, 5 mg Verapamil and 200 mcg nitroglycerin diluted in patient's own blood was performed. No significant fluctuation in patient's blood pressure seen. Then, a right radial artery angiogram was obtained via sheath side port. Normal brachial artery branching pattern seen. No significant anatomical variation. The right radial artery caliber is adequate for vascular access. Then, a 5 [place] Simmons 2 glide catheter was navigated over a 0.035 inch Terumo Glidewire into  the right subclavian artery. A right subclavian artery roadmap was obtained. The catheter was then advanced into the V2 segment of the right  vertebral artery. Townes and lateral views of the head were obtained centered on the posterior fossa. 3D rotational angiograms were acquired and post processed in a separate workstation under concurrent attending physician supervision. Selected images were sent to PACS. FINDINGS: Again seen is a 5 mm dissecting aneurysm as seen on prior angiogram. However, it appears that this aneurysm originates from a dissected branch off of the telovelotonsillar segment of the right posterior inferior cerebellar artery (PICA). The right vertebral artery, basilar artery, and bilateral posterior cerebral arteries are unremarkable. No abnormally high-flow, early draining veins are seen. No regions of abnormal hypervascularity are noted. The visualized dural sinuses are patent. PROCEDURE: Under biplane roadmap, the Simmons 2 catheter was removed over the wire. Intra arterial infusion of 5000 units of heparin, 200 mcg of nitroglycerin and 5 mg of verapamil was performed to the right radial artery via sheath side port. Then, a benchmark guide catheter was advanced over the wire and under fluoroscopic guidance into the distal V2 segment of the right vertebral artery. Magnified frontal and lateral views of the head were obtained in the working projections. Using biplane roadmap, a headway duo microcatheter was coaxially advanced over a synchro 2 micro guidewire into the right vertebral artery and then navigated into the right posteroinferior cerebellar artery. Frontal and lateral angiograms were obtained with microcatheter contrast injection. The microcatheter was advanced near the origin of the target vessel. Attempted placement of a 2 mm x 4 cm Target 360 Nano coil proved unsuccessful. Follow-up right vertebral artery angiograms with magnified frontal and lateral views of the head were projections  obtained. The microcatheter was then advanced and wedged at the origin of the right targeted PICA branch. The microcatheter was then prepped with DMSO. Following, slow injection of onyx 18 was performed under fluoroscopy. Embolization of the target vessel achieved. Small reflux into the PICA noted. The catheter was removed under constant aspiration. Follow-up angiogram showed occlusion of PICA at the telovelotonsillar segment. Right vertebral artery angiograms with frontal and lateral views of the entire head to evidence of distal thromboembolic complication it. Flat panel CT of the head was obtained and post processed in a separate workstation with concurrent attending physician supervision. Selected images were sent to PACS. No evidence of hemorrhagic complication noted. The catheter was subsequently withdrawn. The right radial sheath was removed and inflatable band was placed over the access site for patent hemostasis. 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 PLAN: 1. Continued observation in ICU. 2. Stat head CT for any neurological deterioration. Patient and spouse communicated in person. Electronically Signed   By: Pedro Earls M.D.   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 He presented with acute onset of posterior headache, dizziness and vomiting on 09/17/2019. 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 EXAM: DIAGNOSTIC CEREBRAL ANGIOGRAM AND ENDOVASCULAR EMBOLIZATION COMPARISON:  Diagnostic cerebral angiogram 09/18/2019  MEDICATIONS: No antibiotic administered. ANESTHESIA/SEDATION: The procedure was performed under general anesthesia. FLUOROSCOPY TIME:  Fluoroscopy Time: 74 minutes 42 seconds (5436 mGy). COMPLICATIONS: SIR LEVEL B - Normal therapy, includes overnight admission for observation. TECHNIQUE: Informed written consent was obtained from the patient after a thorough  discussion of the procedural risks, benefits and alternatives. All questions were addressed. Maximal Sterile Barrier Technique was utilized including caps, mask, sterile gowns, sterile gloves, sterile drape, hand hygiene and skin antiseptic. A timeout was performed prior to the initiation of the procedure. Real-time ultrasound guidance was utilized for vascular access including the acquisition of a permanent ultrasound image documenting patency of the accessed vessel. Using the modified Seldinger technique and a micropuncture kit, access was gained to the right radial artery at the wrist and a 6 French sheath was placed. Slow intra arterial infusion of 5,000 IU heparin, 5 mg Verapamil and 200 mcg nitroglycerin diluted in patient's own blood was performed. No significant fluctuation in patient's blood pressure seen. Then, a right radial artery angiogram was obtained via sheath side port. Normal brachial artery branching pattern seen. No significant anatomical variation. The right radial artery caliber is adequate for vascular access. Then, a 5 [place] Simmons 2 glide catheter was navigated over a 0.035 inch Terumo Glidewire into the right subclavian artery. A right subclavian artery roadmap was obtained. The catheter was then advanced into the V2 segment of the right vertebral artery. Townes and lateral views of the head were obtained centered on the posterior fossa. 3D rotational angiograms were acquired and post processed in a separate workstation under concurrent attending physician supervision. Selected images were sent to PACS. FINDINGS: Again seen is a 5 mm  dissecting aneurysm as seen on prior angiogram. However, it appears that this aneurysm originates from a dissected branch off of the telovelotonsillar segment of the right posterior inferior cerebellar artery (PICA). The right vertebral artery, basilar artery, and bilateral posterior cerebral arteries are unremarkable. No abnormally high-flow, early draining veins are seen. No regions of abnormal hypervascularity are noted. The visualized dural sinuses are patent. PROCEDURE: Under biplane roadmap, the Simmons 2 catheter was removed over the wire. Intra arterial infusion of 5000 units of heparin, 200 mcg of nitroglycerin and 5 mg of verapamil was performed to the right radial artery via sheath side port. Then, a benchmark guide catheter was advanced over the wire and under fluoroscopic guidance into the distal V2 segment of the right vertebral artery. Magnified frontal and lateral views of the head were obtained in the working projections. Using biplane roadmap, a headway duo microcatheter was coaxially advanced over a synchro 2 micro guidewire into the right vertebral artery and then navigated into the right posteroinferior cerebellar artery. Frontal and lateral angiograms were obtained with microcatheter contrast injection. The microcatheter was advanced near the origin of the target vessel. Attempted placement of a 2 mm x 4 cm Target 360 Nano coil proved unsuccessful. Follow-up right vertebral artery angiograms with magnified frontal and lateral views of the head were projections obtained. The microcatheter was then advanced and wedged at the origin of the right targeted PICA branch. The microcatheter was then prepped with DMSO. Following, slow injection of onyx 18 was performed under fluoroscopy. Embolization of the target vessel achieved. Small reflux into the PICA noted. The catheter was removed under constant aspiration. Follow-up angiogram showed occlusion of PICA at the telovelotonsillar segment. Right  vertebral artery angiograms with frontal and lateral views of the entire head to evidence of distal thromboembolic complication it. Flat panel CT of the head was obtained and post processed in a separate workstation with concurrent attending physician supervision. Selected images were sent to PACS. No evidence of hemorrhagic complication noted. The catheter was subsequently withdrawn. The right radial sheath was removed and inflatable band was placed over  the access site for patent hemostasis. 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 PLAN: 1. Continued observation in ICU. 2. Stat head CT for any neurological deterioration. Patient and spouse communicated in person. Electronically Signed   By: Pedro Earls M.D.   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 He presented with acute onset of posterior headache, dizziness and vomiting on 09/17/2019. 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 EXAM: DIAGNOSTIC CEREBRAL ANGIOGRAM AND ENDOVASCULAR EMBOLIZATION COMPARISON:  Diagnostic cerebral angiogram 09/18/2019 MEDICATIONS: No antibiotic administered. ANESTHESIA/SEDATION: The procedure was performed under general anesthesia. FLUOROSCOPY TIME:  Fluoroscopy Time: 74 minutes 42 seconds (5436 mGy). COMPLICATIONS: SIR LEVEL B - Normal therapy, includes overnight admission for observation. TECHNIQUE: Informed written consent was obtained from the patient after a thorough discussion of the procedural risks, benefits and alternatives. All questions were  addressed. Maximal Sterile Barrier Technique was utilized including caps, mask, sterile gowns, sterile gloves, sterile drape, hand hygiene and skin antiseptic. A timeout was performed prior to the initiation of the procedure. Real-time ultrasound guidance was utilized for vascular access including the acquisition of a permanent ultrasound image documenting patency of the accessed vessel. Using the modified Seldinger technique and a micropuncture kit, access was gained to the right radial artery at the wrist and a 6 French sheath was placed. Slow intra arterial infusion of 5,000 IU heparin, 5 mg Verapamil and 200 mcg nitroglycerin diluted in patient's own blood was performed. No significant fluctuation in patient's blood pressure seen. Then, a right radial artery angiogram was obtained via sheath side port. Normal brachial artery branching pattern seen. No significant anatomical variation. The right radial artery caliber is adequate for vascular access. Then, a 5 [place] Simmons 2 glide catheter was navigated over a 0.035 inch Terumo Glidewire into the right subclavian artery. A right subclavian artery roadmap was obtained. The catheter was then advanced into the V2 segment of the right vertebral artery. Townes and lateral views of the head were obtained centered on the posterior fossa. 3D rotational angiograms were acquired and post processed in a separate workstation under concurrent attending physician supervision. Selected images were sent to PACS. FINDINGS: Again seen is a 5 mm dissecting aneurysm as seen on prior angiogram. However, it appears that this aneurysm originates from a dissected branch off of the telovelotonsillar segment of the right posterior inferior cerebellar artery (PICA). The right vertebral artery, basilar artery, and bilateral posterior cerebral arteries are unremarkable. No abnormally high-flow, early draining veins are seen. No regions of abnormal hypervascularity are noted. The visualized  dural sinuses are patent. PROCEDURE: Under biplane roadmap, the Simmons 2 catheter was removed over the wire. Intra arterial infusion of 5000 units of heparin, 200 mcg of nitroglycerin and 5 mg of verapamil was performed to the right radial artery via sheath side port. Then, a benchmark guide catheter was advanced over the wire and under fluoroscopic guidance into the distal V2 segment of the right vertebral artery. Magnified frontal and lateral views of the head were obtained in the working projections. Using biplane roadmap, a headway duo microcatheter was coaxially advanced over a synchro 2 micro guidewire into the right vertebral artery  and then navigated into the right posteroinferior cerebellar artery. Frontal and lateral angiograms were obtained with microcatheter contrast injection. The microcatheter was advanced near the origin of the target vessel. Attempted placement of a 2 mm x 4 cm Target 360 Nano coil proved unsuccessful. Follow-up right vertebral artery angiograms with magnified frontal and lateral views of the head were projections obtained. The microcatheter was then advanced and wedged at the origin of the right targeted PICA branch. The microcatheter was then prepped with DMSO. Following, slow injection of onyx 18 was performed under fluoroscopy. Embolization of the target vessel achieved. Small reflux into the PICA noted. The catheter was removed under constant aspiration. Follow-up angiogram showed occlusion of PICA at the telovelotonsillar segment. Right vertebral artery angiograms with frontal and lateral views of the entire head to evidence of distal thromboembolic complication it. Flat panel CT of the head was obtained and post processed in a separate workstation with concurrent attending physician supervision. Selected images were sent to PACS. No evidence of hemorrhagic complication noted. The catheter was subsequently withdrawn. The right radial sheath was removed and inflatable band was  placed over the access site for patent hemostasis. 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 PLAN: 1. Continued observation in ICU. 2. Stat head CT for any neurological deterioration. Patient and spouse communicated in person. Electronically Signed   By: Pedro Earls M.D.   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 He presented with acute onset of posterior headache, dizziness and vomiting on 09/17/2019. 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 EXAM: DIAGNOSTIC CEREBRAL ANGIOGRAM AND ENDOVASCULAR EMBOLIZATION COMPARISON:  Diagnostic cerebral angiogram 09/18/2019 MEDICATIONS: No antibiotic administered. ANESTHESIA/SEDATION: The procedure was performed under general anesthesia. FLUOROSCOPY TIME:  Fluoroscopy Time: 74 minutes 42 seconds (5436 mGy). COMPLICATIONS: SIR LEVEL B - Normal therapy, includes overnight admission for observation. TECHNIQUE: Informed written consent was obtained from the patient after a thorough discussion of the procedural risks, benefits and alternatives. All questions were addressed. Maximal Sterile Barrier Technique was utilized including caps, mask, sterile gowns, sterile gloves, sterile drape, hand hygiene and skin antiseptic. A timeout was performed prior to the initiation of the procedure. Real-time ultrasound guidance was utilized for vascular access including the acquisition of a permanent ultrasound image documenting patency of the accessed vessel. Using the modified Seldinger technique and a micropuncture kit,  access was gained to the right radial artery at the wrist and a 6 French sheath was placed. Slow intra arterial infusion of 5,000 IU heparin, 5 mg Verapamil and 200 mcg nitroglycerin diluted in patient's own blood was performed. No significant fluctuation in patient's blood pressure seen. Then, a right radial artery angiogram was obtained via sheath side port. Normal brachial artery branching pattern seen. No significant anatomical variation. The right radial artery caliber is adequate for vascular access. Then, a 5 [place] Simmons 2 glide catheter was navigated over a 0.035 inch Terumo Glidewire into the right subclavian artery. A right subclavian artery roadmap was obtained. The catheter was then advanced into the V2 segment of the right vertebral artery. Townes and lateral views of the head were obtained centered on the posterior fossa. 3D rotational  angiograms were acquired and post processed in a separate workstation under concurrent attending physician supervision. Selected images were sent to PACS. FINDINGS: Again seen is a 5 mm dissecting aneurysm as seen on prior angiogram. However, it appears that this aneurysm originates from a dissected branch off of the telovelotonsillar segment of the right posterior inferior cerebellar artery (PICA). The right vertebral artery, basilar artery, and bilateral posterior cerebral arteries are unremarkable. No abnormally high-flow, early draining veins are seen. No regions of abnormal hypervascularity are noted. The visualized dural sinuses are patent. PROCEDURE: Under biplane roadmap, the Simmons 2 catheter was removed over the wire. Intra arterial infusion of 5000 units of heparin, 200 mcg of nitroglycerin and 5 mg of verapamil was performed to the right radial artery via sheath side port. Then, a benchmark guide catheter was advanced over the wire and under fluoroscopic guidance into the distal V2 segment of the right vertebral artery. Magnified frontal and lateral views  of the head were obtained in the working projections. Using biplane roadmap, a headway duo microcatheter was coaxially advanced over a synchro 2 micro guidewire into the right vertebral artery and then navigated into the right posteroinferior cerebellar artery. Frontal and lateral angiograms were obtained with microcatheter contrast injection. The microcatheter was advanced near the origin of the target vessel. Attempted placement of a 2 mm x 4 cm Target 360 Nano coil proved unsuccessful. Follow-up right vertebral artery angiograms with magnified frontal and lateral views of the head were projections obtained. The microcatheter was then advanced and wedged at the origin of the right targeted PICA branch. The microcatheter was then prepped with DMSO. Following, slow injection of onyx 18 was performed under fluoroscopy. Embolization of the target vessel achieved. Small reflux into the PICA noted. The catheter was removed under constant aspiration. Follow-up angiogram showed occlusion of PICA at the telovelotonsillar segment. Right vertebral artery angiograms with frontal and lateral views of the entire head to evidence of distal thromboembolic complication it. Flat panel CT of the head was obtained and post processed in a separate workstation with concurrent attending physician supervision. Selected images were sent to PACS. No evidence of hemorrhagic complication noted. The catheter was subsequently withdrawn. The right radial sheath was removed and inflatable band was placed over the access site for patent hemostasis. 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 PLAN: 1. Continued observation in ICU. 2. Stat head CT for any neurological deterioration. Patient and spouse communicated in person. Electronically Signed   By: Pedro Earls M.D.   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 He presented with acute onset of posterior headache, dizziness and vomiting on 09/17/2019. 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 EXAM: DIAGNOSTIC CEREBRAL ANGIOGRAM AND ENDOVASCULAR EMBOLIZATION COMPARISON:  Diagnostic cerebral angiogram 09/18/2019 MEDICATIONS: No antibiotic administered. ANESTHESIA/SEDATION: The procedure was performed under general anesthesia. FLUOROSCOPY TIME:  Fluoroscopy Time: 74 minutes 42 seconds (5436 mGy). COMPLICATIONS: SIR LEVEL B - Normal therapy, includes overnight admission for observation. TECHNIQUE: Informed written consent was obtained from the patient after a thorough discussion of the procedural risks, benefits and alternatives. All questions were addressed. Maximal Sterile Barrier Technique was  utilized including caps, mask, sterile gowns, sterile gloves, sterile drape, hand hygiene and skin antiseptic. A timeout was performed prior to the initiation of the procedure. Real-time ultrasound guidance was utilized for vascular access including the acquisition of a permanent ultrasound image documenting patency of the accessed vessel. Using the modified Seldinger technique and a micropuncture kit, access was gained to the right radial artery at the wrist and a 6 French sheath was placed. Slow intra arterial infusion of 5,000 IU heparin, 5 mg Verapamil and 200 mcg nitroglycerin diluted in patient's own blood was performed. No significant fluctuation in patient's blood pressure seen. Then, a right radial artery angiogram was obtained via sheath side port. Normal brachial artery branching pattern seen. No significant anatomical  variation. The right radial artery caliber is adequate for vascular access. Then, a 5 [place] Simmons 2 glide catheter was navigated over a 0.035 inch Terumo Glidewire into the right subclavian artery. A right subclavian artery roadmap was obtained. The catheter was then advanced into the V2 segment of the right vertebral artery. Townes and lateral views of the head were obtained centered on the posterior fossa. 3D rotational angiograms were acquired and post processed in a separate workstation under concurrent attending physician supervision. Selected images were sent to PACS. FINDINGS: Again seen is a 5 mm dissecting aneurysm as seen on prior angiogram. However, it appears that this aneurysm originates from a dissected branch off of the telovelotonsillar segment of the right posterior inferior cerebellar artery (PICA). The right vertebral artery, basilar artery, and bilateral posterior cerebral arteries are unremarkable. No abnormally high-flow, early draining veins are seen. No regions of abnormal hypervascularity are noted. The visualized dural sinuses are patent. PROCEDURE: Under biplane roadmap, the Simmons 2 catheter was removed over the wire. Intra arterial infusion of 5000 units of heparin, 200 mcg of nitroglycerin and 5 mg of verapamil was performed to the right radial artery via sheath side port. Then, a benchmark guide catheter was advanced over the wire and under fluoroscopic guidance into the distal V2 segment of the right vertebral artery. Magnified frontal and lateral views of the head were obtained in the working projections. Using biplane roadmap, a headway duo microcatheter was coaxially advanced over a synchro 2 micro guidewire into the right vertebral artery and then navigated into the right posteroinferior cerebellar artery. Frontal and lateral angiograms were obtained with microcatheter contrast injection. The microcatheter was advanced near the origin of the target vessel. Attempted placement  of a 2 mm x 4 cm Target 360 Nano coil proved unsuccessful. Follow-up right vertebral artery angiograms with magnified frontal and lateral views of the head were projections obtained. The microcatheter was then advanced and wedged at the origin of the right targeted PICA branch. The microcatheter was then prepped with DMSO. Following, slow injection of onyx 18 was performed under fluoroscopy. Embolization of the target vessel achieved. Small reflux into the PICA noted. The catheter was removed under constant aspiration. Follow-up angiogram showed occlusion of PICA at the telovelotonsillar segment. Right vertebral artery angiograms with frontal and lateral views of the entire head to evidence of distal thromboembolic complication it. Flat panel CT of the head was obtained and post processed in a separate workstation with concurrent attending physician supervision. Selected images were sent to PACS. No evidence of hemorrhagic complication noted. The catheter was subsequently withdrawn. The right radial sheath was removed and inflatable band was placed over the access site for patent hemostasis. 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 PLAN: 1. Continued observation in ICU. 2. Stat head CT for any neurological deterioration. Patient and spouse communicated in person. Electronically Signed   By: Pedro Earls M.D.   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 He presented with acute onset of posterior headache, dizziness and vomiting on 09/17/2019. 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 Given recurrence of hemorrhage in the same location, further investigation  with a diagnostic cerebral angiogram was recommended. EXAM: Diagnostic cerebral angiogram COMPARISON:  CT angiogram of the head July 01, 2016 MEDICATIONS: No antibiotics given. ANESTHESIA/SEDATION: Versed 0.5 mg IV; Fentanyl 50 mcg IV Moderate Sedation Time:  60 The patient was continuously monitored during the procedure by the interventional radiology nurse under my direct supervision. FLUOROSCOPY TIME:  Fluoroscopy Time: 14 minutes 30 seconds (1656 mGy). COMPLICATIONS: None immediate. TECHNIQUE: Informed written consent was obtained from the patient after a thorough discussion of the procedural risks, benefits and alternatives. All questions were addressed. Maximal Sterile Barrier Technique was utilized including caps, mask, sterile gowns, sterile gloves, sterile drape, hand hygiene and skin antiseptic. A timeout was performed prior to the initiation of the procedure. Using the modified Seldinger technique and a micropuncture kit, access was gained to the right radial artery at the wrist and a 5 French sheath was placed. Slow intra arterial infusion of 5,000 IU heparin, 5 mg Verapamil and 151 mcg nitroglicerin diluted in patient's own blood was performed. No significant fluctuation in patient's blood pressure seen. Then, a right radial artery angiogram was obtained via sheath side port. Normal brachial artery branching pattern seen. No significant anatomical variation. The right radial artery caliber is adequate for vascular access. Then, a 5 [place] Simmons 2 glide catheter was navigated over a 0.035 inch Terumo Glidewire into the right subclavian artery. A right subclavian artery roadmap was obtained. The catheter was then advanced into the V2 segment of the right vertebral artery. Townes, lateral and bilateral magnified oblique views of the head were obtained centered on the posterior fossa. The catheter was then placed into the aortic arch and then advanced into the left common carotid artery. Left anterior  oblique view of the neck was obtained followed by frontal, lateral and bilateral oblique views of the head. The catheter was then placed into the left subclavian artery. Frontal and lateral angiograms of the head were obtained followed by frontal the angiogram of the neck. The catheter was subsequently placed in the right common carotid artery. Right anterior oblique view of the neck was obtained. Following, frontal, lateral and bilateral oblique views of the head were obtained. The catheter was subsequently withdrawn. The right radial artery sheath was removed and inflatable band was placed over the access site for patent hemostasis. FINDINGS: Right radial artery ultrasound and right radial artery angiogram: The caliber of the distal right radial artery is appropriate for angiogram access. The right radial artery and the right ulnar artery have normal course and caliber. No significant anatomical variants noted. Right vertebral artery angiograms: The right vertebral artery, basilar artery, and bilateral posterior cerebral arteries are unremarkable. 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 No abnormally high-flow, early draining veins are seen. No regions of abnormal hypervascularity are noted.  The visualized dural sinuses are patent. Left CCA angiograms: Cervical angiograms show normal course and caliber of the visualized left common carotid and internal carotid arteries. There are no significant stenoses. There is brisk vascular contrast filling of the the ACA and MCA vascular trees. Luminal caliber is smooth and tapering. No aneurysms or abnormally high-flow, early draining veins are seen. No regions of abnormal hypervascularity are noted. The intracranial branches of the left external carotid artery are unremarkable. The visualized dural sinuses are patent. Left subclavian and thyrocervical angiograms: The  visualized subclavian artery, proximal left vertebral artery and visualized branches of the thyrocervical trunk are unremarkable. Suboptimal opacification of the intracranial vasculature obtained. The left vertebral artery is codominant and of normal caliber. Right CCA angiograms: Cervical angiograms show normal course and caliber of the visualized right common carotid and internal carotid arteries. There are no significant stenoses. There is brisk vascular contrast filling of the ACA and MCA vascular trees. Luminal caliber is smooth and tapering. No aneurysms or abnormally high-flow, early draining veins are seen. No regions of abnormal hypervascularity are noted. The intracranial branches of the right external carotid artery are unremarkable. The visualized dural sinuses are patent. PROCEDURE: Not applicable. 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 PLAN: Angiogram findings discussed with the patient and his wife as well as with Dr. Erlinda Hong. Planned for angiogram with aneurysm embolization tomorrow. Electronically Signed   By: Pedro Earls M.D.   On: 09/19/2019 16:01   ECHOCARDIOGRAM COMPLETE  Result Date: 09/18/2019    ECHOCARDIOGRAM REPORT   Patient Name:   Andrew Perez Date of Exam: 09/18/2019 Medical Rec #:  [id number]     Height:       71.0 in Accession #:    [id number]    Weight:       418.4 lb Date of Birth:  February 22, 1966     BSA:          2.886 m Patient Age:    63 years      BP:           124/54 mmHg Patient Gender: M             HR:           62 bpm. Exam Location:  Inpatient Procedure: 2D Echo, Color Doppler, Cardiac Doppler and Intracardiac            Opacification Agent Indications:    Stroke i163.9  History:        Patient has prior history of Echocardiogram examinations, most                 recent 06/28/2016. Risk Factors:Hypertension, Dyslipidemia and                  Sleep Apnea.  Sonographer:    Raquel Sarna Senior RDCS Referring Phys: [id number] ASHISH ARORA  Sonographer Comments: Technically difficult study due to patient body habitus and poor echo windows. IMPRESSIONS  1. Left ventricular ejection fraction, by estimation, is 55 to 60%. The left ventricle has normal function. The left ventricle has no regional wall motion abnormalities. There is moderate left ventricular hypertrophy. Left ventricular diastolic parameters are consistent with Grade II diastolic dysfunction (pseudonormalization).  2. Right ventricular systolic function is normal. The right ventricular size is mildly enlarged. Tricuspid regurgitation signal is inadequate for assessing PA pressure.  3. Left atrial size was moderately dilated.  4. Right atrial size was mildly dilated.  5. The mitral valve is normal in structure. No evidence of mitral valve regurgitation. No evidence of mitral stenosis.  6. The aortic valve is tricuspid. Aortic valve regurgitation is not visualized. No aortic stenosis is present.  7. Aortic dilatation noted. There is mild dilatation of the ascending aorta measuring 39 mm.  8. The inferior vena cava is dilated in size with <50% respiratory variability, suggesting right atrial pressure of 15 mmHg.  9. Technically difficult study with poor acoustic windows. FINDINGS  Left Ventricle: Left ventricular ejection fraction, by estimation, is 55 to 60%. The left ventricle has normal function. The left ventricle has no regional wall motion abnormalities. Definity contrast agent was given IV to delineate the left ventricular  endocardial borders. The left ventricular internal cavity size was normal in size. There is moderate left ventricular hypertrophy. Left ventricular diastolic parameters are consistent with Grade II diastolic dysfunction (pseudonormalization). Right Ventricle: The right ventricular size is mildly enlarged. No increase in right ventricular wall thickness. Right ventricular  systolic function is normal. Tricuspid regurgitation signal is inadequate for assessing PA pressure. Left Atrium: Left atrial size was moderately dilated. Right Atrium: Right atrial size was mildly dilated. Pericardium: There is no evidence of pericardial effusion. Mitral Valve: The mitral valve is normal in structure. No evidence of mitral valve regurgitation. No evidence of mitral valve stenosis. Tricuspid Valve: The tricuspid valve is normal in structure. Tricuspid valve regurgitation is not demonstrated. Aortic Valve: The aortic valve is tricuspid. Aortic valve regurgitation is not visualized. No aortic stenosis is present. Pulmonic Valve: The pulmonic valve was normal in structure. Pulmonic valve regurgitation is not visualized. Aorta: Aortic dilatation noted. There is mild dilatation of the ascending aorta measuring 39 mm. Venous: The inferior vena cava is dilated in size with less than 50% respiratory variability, suggesting right atrial pressure of 15 mmHg. IAS/Shunts: The interatrial septum was not well visualized.  LEFT VENTRICLE PLAX 2D LVIDd:         5.47 cm  Diastology LVIDs:         2.83 cm  LV e' lateral:   7.72 cm/s LV PW:         1.87 cm  LV E/e' lateral: 14.6 LV IVS:        1.67 cm  LV e' medial:    7.40 cm/s LVOT diam:     2.30 cm  LV E/e' medial:  15.3 LV SV:         94 LV SV Index:   33 LVOT Area:     4.15 cm  RIGHT VENTRICLE RV S prime:     16.90 cm/s TAPSE (M-mode): 3.2 cm LEFT ATRIUM              Index       RIGHT ATRIUM           Index LA diam:        3.50 cm  1.21 cm/m  RA Area:     25.10 cm LA Vol (A2C):   105.0 ml 36.39 ml/m RA Volume:   78.70 ml  27.27 ml/m LA Vol (A4C):   149.0 ml 51.64 ml/m LA Biplane Vol: 138.0 ml 47.82 ml/m  AORTIC VALVE LVOT Vmax:   112.00 cm/s LVOT Vmean:  70.000 cm/s LVOT VTI:    0.227 m  AORTA Ao Root diam: 3.60 cm Ao Asc diam:  3.85 cm MITRAL VALVE MV Area (PHT): 2.80 cm     SHUNTS MV  Decel Time: 271 msec     Systemic VTI:  0.23 m MV E velocity: 113.00  cm/s  Systemic Diam: 2.30 cm MV A velocity: 112.00 cm/s MV E/A ratio:  1.01 Loralie Champagne MD Electronically signed by Loralie Champagne MD Signature Date/Time: 09/18/2019/4:52:31 PM    Final    IR ANGIO INTRA EXTRACRAN SEL COM CAROTID INNOMINATE BILAT MOD SED  Result Date: 09/19/2019 INDICATION: 54 year old male with past medical history significant for hypertension, 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 He presented with acute onset of posterior headache, dizziness and vomiting on 09/17/2019. 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 Given recurrence of hemorrhage in the same location, further investigation with a diagnostic cerebral angiogram was recommended. EXAM: Diagnostic cerebral angiogram COMPARISON:  CT angiogram of the head July 01, 2016 MEDICATIONS: No antibiotics given. ANESTHESIA/SEDATION: Versed 0.5 mg IV; Fentanyl 50 mcg IV Moderate Sedation Time:  60 The patient was continuously monitored during the procedure by the interventional radiology nurse under my direct supervision. FLUOROSCOPY TIME:  Fluoroscopy Time: 14 minutes 30 seconds (1656 mGy). COMPLICATIONS: None immediate. TECHNIQUE: Informed written consent was obtained from the patient after a thorough discussion of the procedural risks, benefits and alternatives. All questions were addressed. Maximal Sterile Barrier Technique was utilized including caps, mask, sterile gowns, sterile gloves, sterile drape, hand hygiene and skin antiseptic. A timeout was performed prior to the initiation of the procedure. Using the modified Seldinger technique and a micropuncture kit, access was gained to the right radial artery at the wrist and a 5 French sheath was placed. Slow intra arterial infusion of 5,000 IU heparin, 5 mg Verapamil and 865 mcg nitroglicerin diluted in patient's own blood was performed. No significant fluctuation in patient's blood pressure  seen. Then, a right radial artery angiogram was obtained via sheath side port. Normal brachial artery branching pattern seen. No significant anatomical variation. The right radial artery caliber is adequate for vascular access. Then, a 5 [place] Simmons 2 glide catheter was navigated over a 0.035 inch Terumo Glidewire into the right subclavian artery. A right subclavian artery roadmap was obtained. The catheter was then advanced into the V2 segment of the right vertebral artery. Townes, lateral and bilateral magnified oblique views of the head were obtained centered on the posterior fossa. The catheter was then placed into the aortic arch and then advanced into the left common carotid artery. Left anterior oblique view of the neck was obtained followed by frontal, lateral and bilateral oblique views of the head. The catheter was then placed into the left subclavian artery. Frontal and lateral angiograms of the head were obtained followed by frontal the angiogram of the neck. The catheter was subsequently placed in the right common carotid artery. Right anterior oblique view of the neck was obtained. Following, frontal, lateral and bilateral oblique views of the head were obtained. The catheter was subsequently withdrawn. The right radial artery sheath was removed and inflatable band was placed over the access site for patent hemostasis. FINDINGS: Right radial artery ultrasound and right radial artery angiogram: The caliber of the distal right radial artery is appropriate for angiogram access. The right radial artery and the right ulnar artery have normal course and caliber. No significant anatomical variants noted. Right vertebral artery angiograms: The right vertebral artery, basilar artery, and bilateral posterior cerebral arteries are unremarkable. 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 No  abnormally high-flow,  early draining veins are seen. No regions of abnormal hypervascularity are noted. The visualized dural sinuses are patent. Left CCA angiograms: Cervical angiograms show normal course and caliber of the visualized left common carotid and internal carotid arteries. There are no significant stenoses. There is brisk vascular contrast filling of the the ACA and MCA vascular trees. Luminal caliber is smooth and tapering. No aneurysms or abnormally high-flow, early draining veins are seen. No regions of abnormal hypervascularity are noted. The intracranial branches of the left external carotid artery are unremarkable. The visualized dural sinuses are patent. Left subclavian and thyrocervical angiograms: The visualized subclavian artery, proximal left vertebral artery and visualized branches of the thyrocervical trunk are unremarkable. Suboptimal opacification of the intracranial vasculature obtained. The left vertebral artery is codominant and of normal caliber. Right CCA angiograms: Cervical angiograms show normal course and caliber of the visualized right common carotid and internal carotid arteries. There are no significant stenoses. There is brisk vascular contrast filling of the ACA and MCA vascular trees. Luminal caliber is smooth and tapering. No aneurysms or abnormally high-flow, early draining veins are seen. No regions of abnormal hypervascularity are noted. The intracranial branches of the right external carotid artery are unremarkable. The visualized dural sinuses are patent. PROCEDURE: Not applicable. 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 PLAN: Angiogram findings discussed with the patient and his wife as well as with Dr. Erlinda Hong. Planned for angiogram with aneurysm embolization tomorrow. Electronically Signed   By: Pedro Earls M.D.   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 He presented with acute onset of posterior headache, dizziness and vomiting on 09/17/2019. 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 Given recurrence of hemorrhage in the same location, further investigation with a diagnostic cerebral angiogram was recommended. EXAM: Diagnostic cerebral angiogram COMPARISON:  CT angiogram of the head July 01, 2016 MEDICATIONS: No antibiotics given. ANESTHESIA/SEDATION: Versed 0.5 mg IV; Fentanyl 50 mcg IV Moderate Sedation Time:  60 The patient was continuously monitored during the procedure by the interventional radiology nurse under my direct supervision. FLUOROSCOPY TIME:  Fluoroscopy Time: 14 minutes 30 seconds (1656 mGy). COMPLICATIONS: None immediate. TECHNIQUE: Informed written consent was obtained from the patient after a thorough discussion of the procedural risks, benefits and alternatives. All questions were addressed. Maximal Sterile Barrier Technique was utilized including caps, mask, sterile gowns, sterile gloves, sterile drape, hand hygiene and skin antiseptic. A timeout was performed prior to the initiation of the procedure. Using the modified Seldinger technique and a micropuncture kit, access was gained to the right radial artery at the wrist and a 5 French sheath was placed. Slow intra arterial infusion of 5,000 IU heparin, 5 mg Verapamil and 542 mcg nitroglicerin diluted in patient's own blood was performed. No significant fluctuation in patient's blood pressure seen. Then, a right radial artery angiogram was obtained via sheath side port. Normal brachial artery branching pattern seen. No significant anatomical variation. The right radial artery caliber is  adequate for vascular access. Then, a 5 [place] Simmons 2 glide catheter was navigated over a 0.035 inch Terumo Glidewire into the right subclavian artery. A right subclavian artery roadmap was obtained. The catheter  was then advanced into the V2 segment of the right vertebral artery. Townes, lateral and bilateral magnified oblique views of the head were obtained centered on the posterior fossa. The catheter was then placed into the aortic arch and then advanced into the left common carotid artery. Left anterior oblique view of the neck was obtained followed by frontal, lateral and bilateral oblique views of the head. The catheter was then placed into the left subclavian artery. Frontal and lateral angiograms of the head were obtained followed by frontal the angiogram of the neck. The catheter was subsequently placed in the right common carotid artery. Right anterior oblique view of the neck was obtained. Following, frontal, lateral and bilateral oblique views of the head were obtained. The catheter was subsequently withdrawn. The right radial artery sheath was removed and inflatable band was placed over the access site for patent hemostasis. FINDINGS: Right radial artery ultrasound and right radial artery angiogram: The caliber of the distal right radial artery is appropriate for angiogram access. The right radial artery and the right ulnar artery have normal course and caliber. No significant anatomical variants noted. Right vertebral artery angiograms: The right vertebral artery, basilar artery, and bilateral posterior cerebral arteries are unremarkable. 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 No abnormally high-flow, early draining veins are seen. No regions of abnormal hypervascularity are noted. The visualized dural sinuses are patent. Left CCA angiograms: Cervical angiograms show normal course and caliber of  the visualized left common carotid and internal carotid arteries. There are no significant stenoses. There is brisk vascular contrast filling of the the ACA and MCA vascular trees. Luminal caliber is smooth and tapering. No aneurysms or abnormally high-flow, early draining veins are seen. No regions of abnormal hypervascularity are noted. The intracranial branches of the left external carotid artery are unremarkable. The visualized dural sinuses are patent. Left subclavian and thyrocervical angiograms: The visualized subclavian artery, proximal left vertebral artery and visualized branches of the thyrocervical trunk are unremarkable. Suboptimal opacification of the intracranial vasculature obtained. The left vertebral artery is codominant and of normal caliber. Right CCA angiograms: Cervical angiograms show normal course and caliber of the visualized right common carotid and internal carotid arteries. There are no significant stenoses. There is brisk vascular contrast filling of the ACA and MCA vascular trees. Luminal caliber is smooth and tapering. No aneurysms or abnormally high-flow, early draining veins are seen. No regions of abnormal hypervascularity are noted. The intracranial branches of the right external carotid artery are unremarkable. The visualized dural sinuses are patent. PROCEDURE: Not applicable. 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 PLAN: Angiogram findings discussed with the patient and his wife as well as with Dr. Erlinda Hong. Planned for angiogram with aneurysm embolization tomorrow. Electronically Signed   By: Pedro Earls M.D.   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 He presented with acute onset of posterior headache, dizziness and vomiting on 09/17/2019. 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 EXAM: DIAGNOSTIC CEREBRAL ANGIOGRAM AND ENDOVASCULAR EMBOLIZATION COMPARISON:  Diagnostic cerebral angiogram 09/18/2019 MEDICATIONS: No antibiotic administered. ANESTHESIA/SEDATION: The procedure was performed under general anesthesia. FLUOROSCOPY TIME:  Fluoroscopy Time: 74 minutes 42 seconds (5436 mGy). COMPLICATIONS: SIR LEVEL B - Normal therapy, includes overnight admission for observation. TECHNIQUE: Informed written consent was obtained from the patient after a thorough discussion of the procedural risks, benefits and alternatives. All questions were addressed. Maximal Sterile Barrier Technique was utilized including caps, mask, sterile gowns, sterile gloves, sterile drape, hand hygiene and skin antiseptic. A timeout was performed prior to the initiation of the procedure. Real-time ultrasound guidance was utilized for vascular access including the acquisition of a permanent ultrasound image documenting patency of the accessed vessel. Using the modified Seldinger technique and a micropuncture kit, access was gained to the right radial artery at the wrist and a 6 French sheath was placed. Slow intra arterial infusion of 5,000 IU heparin, 5 mg Verapamil and 200 mcg nitroglycerin diluted in patient's own blood was performed. No significant fluctuation in patient's blood pressure seen. Then, a right radial artery angiogram was obtained via sheath side port. Normal brachial artery branching pattern seen. No significant anatomical variation. The right radial artery caliber is adequate for vascular access. Then, a 5 [place] Simmons 2 glide catheter was navigated over  a 0.035 inch Terumo Glidewire into the right subclavian artery. A right subclavian artery roadmap was obtained. The catheter was then advanced into the V2 segment of the right vertebral artery. Townes and lateral views of the head were obtained centered on the posterior fossa. 3D rotational angiograms were acquired and post processed in a separate workstation under concurrent attending physician supervision. Selected images were sent to PACS. FINDINGS: Again seen is a 5 mm dissecting aneurysm as seen on prior angiogram. However, it appears that this aneurysm originates from a dissected branch off of the telovelotonsillar segment of the right posterior inferior cerebellar artery (PICA). The right vertebral artery, basilar artery, and bilateral posterior cerebral arteries are unremarkable. No abnormally high-flow, early draining veins are seen. No regions of abnormal hypervascularity are noted. The visualized dural sinuses are patent. PROCEDURE: Under biplane roadmap, the Simmons 2 catheter was removed over the wire. Intra arterial infusion of 5000 units of heparin, 200 mcg of nitroglycerin and 5 mg of verapamil was performed to the right radial artery via sheath side port. Then, a benchmark guide catheter was advanced over the wire and under fluoroscopic guidance into the distal V2 segment of the right vertebral artery. Magnified frontal and lateral views of the head were obtained in the working projections. Using biplane roadmap, a headway duo microcatheter was coaxially advanced over a synchro 2 micro guidewire into the right vertebral artery and then navigated into the right posteroinferior cerebellar artery. Frontal and lateral angiograms were obtained with microcatheter contrast injection. The microcatheter was advanced near the origin of the target vessel. Attempted placement of a 2 mm x 4 cm Target 360 Nano coil proved unsuccessful. Follow-up right vertebral artery angiograms with magnified frontal and lateral  views of the head were projections obtained. The microcatheter was then advanced and wedged at the origin of the right targeted PICA branch. The microcatheter was then prepped with DMSO. Following, slow injection of onyx 18 was performed under fluoroscopy. Embolization of the target vessel achieved. Small reflux into the PICA noted. The catheter was removed under constant aspiration. Follow-up angiogram showed occlusion of PICA at the telovelotonsillar segment. Right vertebral artery angiograms with frontal and lateral views of the entire head to evidence of distal thromboembolic  complication it. Flat panel CT of the head was obtained and post processed in a separate workstation with concurrent attending physician supervision. Selected images were sent to PACS. No evidence of hemorrhagic complication noted. The catheter was subsequently withdrawn. The right radial sheath was removed and inflatable band was placed over the access site for patent hemostasis. 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 PLAN: 1. Continued observation in ICU. 2. Stat head CT for any neurological deterioration. Patient and spouse communicated in person. Electronically Signed   By: Pedro Earls M.D.   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 He presented with acute onset of posterior headache, dizziness and vomiting on 09/17/2019. 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 Given recurrence of hemorrhage in the same location, further investigation with a diagnostic cerebral angiogram was recommended. EXAM: Diagnostic cerebral angiogram COMPARISON:  CT angiogram of  the head July 01, 2016 MEDICATIONS: No antibiotics given. ANESTHESIA/SEDATION: Versed 0.5 mg IV; Fentanyl 50 mcg IV Moderate Sedation Time:  60 The patient was continuously monitored during the procedure by the interventional radiology nurse under my direct supervision. FLUOROSCOPY TIME:  Fluoroscopy Time: 14 minutes 30 seconds (1656 mGy). COMPLICATIONS: None immediate. TECHNIQUE: Informed written consent was obtained from the patient after a thorough discussion of the procedural risks, benefits and alternatives. All questions were addressed. Maximal Sterile Barrier Technique was utilized including caps, mask, sterile gowns, sterile gloves, sterile drape, hand hygiene and skin antiseptic. A timeout was performed prior to the initiation of the procedure. Using the modified Seldinger technique and a micropuncture kit, access was gained to the right radial artery at the wrist and a 5 French sheath was placed. Slow intra arterial infusion of 5,000 IU heparin, 5 mg Verapamil and 833 mcg nitroglicerin diluted in patient's own blood was performed. No significant fluctuation in patient's blood pressure seen. Then, a right radial artery angiogram was obtained via sheath side port. Normal brachial artery branching pattern seen. No significant anatomical variation. The right radial artery caliber is adequate for vascular access. Then, a 5 [place] Simmons 2 glide catheter was navigated over a 0.035 inch Terumo Glidewire into the right subclavian artery. A right subclavian artery roadmap was obtained. The catheter was then advanced into the V2 segment of the right vertebral artery. Townes, lateral and bilateral magnified oblique views of the head were obtained centered on the posterior fossa. The catheter was then placed into the aortic arch and then advanced into the left common carotid artery. Left anterior oblique view of the neck was obtained followed by frontal, lateral and bilateral oblique views of the head. The catheter  was then placed into the left subclavian artery. Frontal and lateral angiograms of the head were obtained followed by frontal the angiogram of the neck. The catheter was subsequently placed in the right common carotid artery. Right anterior oblique view of the neck was obtained. Following, frontal, lateral and bilateral oblique views of the head were obtained. The catheter was subsequently withdrawn. The right radial artery sheath was removed and inflatable band was placed over the access site for patent hemostasis. FINDINGS: Right radial artery ultrasound and right radial artery angiogram: The caliber of the distal right radial artery is appropriate for angiogram access.  The right radial artery and the right ulnar artery have normal course and caliber. No significant anatomical variants noted. Right vertebral artery angiograms: The right vertebral artery, basilar artery, and bilateral posterior cerebral arteries are unremarkable. 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 No abnormally high-flow, early draining veins are seen. No regions of abnormal hypervascularity are noted. The visualized dural sinuses are patent. Left CCA angiograms: Cervical angiograms show normal course and caliber of the visualized left common carotid and internal carotid arteries. There are no significant stenoses. There is brisk vascular contrast filling of the the ACA and MCA vascular trees. Luminal caliber is smooth and tapering. No aneurysms or abnormally high-flow, early draining veins are seen. No regions of abnormal hypervascularity are noted. The intracranial branches of the left external carotid artery are unremarkable. The visualized dural sinuses are patent. Left subclavian and thyrocervical angiograms: The visualized subclavian artery, proximal left vertebral artery and visualized branches of the thyrocervical trunk are  unremarkable. Suboptimal opacification of the intracranial vasculature obtained. The left vertebral artery is codominant and of normal caliber. Right CCA angiograms: Cervical angiograms show normal course and caliber of the visualized right common carotid and internal carotid arteries. There are no significant stenoses. There is brisk vascular contrast filling of the ACA and MCA vascular trees. Luminal caliber is smooth and tapering. No aneurysms or abnormally high-flow, early draining veins are seen. No regions of abnormal hypervascularity are noted. The intracranial branches of the right external carotid artery are unremarkable. The visualized dural sinuses are patent. PROCEDURE: Not applicable. 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 PLAN: Angiogram findings discussed with the patient and his wife as well as with Dr. Erlinda Hong. Planned for angiogram with aneurysm embolization tomorrow. Electronically Signed   By: Pedro Earls M.D.   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 He presented with acute onset of posterior headache, dizziness and vomiting on 09/17/2019. 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 EXAM: DIAGNOSTIC CEREBRAL ANGIOGRAM AND ENDOVASCULAR EMBOLIZATION COMPARISON:  Diagnostic cerebral angiogram 09/18/2019 MEDICATIONS: No antibiotic administered. ANESTHESIA/SEDATION: The procedure was performed under general anesthesia. FLUOROSCOPY TIME:  Fluoroscopy Time:  74 minutes 42 seconds (5436 mGy). COMPLICATIONS: SIR LEVEL B - Normal therapy, includes overnight admission for observation. TECHNIQUE: Informed written consent was obtained from the patient after a thorough discussion of the procedural risks, benefits and alternatives. All questions were addressed. Maximal Sterile Barrier Technique was utilized including caps, mask, sterile gowns, sterile gloves, sterile drape, hand hygiene and skin antiseptic. A timeout was performed prior to the initiation of the procedure. Real-time ultrasound guidance was utilized for vascular access including the acquisition of a permanent ultrasound image documenting patency of the accessed vessel. Using the modified Seldinger technique and a micropuncture kit, access was gained to the right radial artery at the wrist and a 6 French sheath was placed. Slow intra arterial infusion of 5,000 IU heparin, 5 mg Verapamil and 200 mcg nitroglycerin diluted in patient's own blood was performed. No significant fluctuation in patient's blood pressure  seen. Then, a right radial artery angiogram was obtained via sheath side port. Normal brachial artery branching pattern seen. No significant anatomical variation. The right radial artery caliber is adequate for vascular access. Then, a 5 [place] Simmons 2 glide catheter was navigated over a 0.035 inch Terumo Glidewire into the right subclavian artery. A right subclavian artery roadmap was obtained. The catheter was then advanced into the V2 segment of the right vertebral artery. Townes and lateral views of the head were obtained centered on the posterior fossa. 3D rotational angiograms were acquired and post processed in a separate workstation under concurrent attending physician supervision. Selected images were sent to PACS. FINDINGS: Again seen is a 5 mm dissecting aneurysm as seen on prior angiogram. However, it appears that this aneurysm originates from a dissected branch off of the telovelotonsillar  segment of the right posterior inferior cerebellar artery (PICA). The right vertebral artery, basilar artery, and bilateral posterior cerebral arteries are unremarkable. No abnormally high-flow, early draining veins are seen. No regions of abnormal hypervascularity are noted. The visualized dural sinuses are patent. PROCEDURE: Under biplane roadmap, the Simmons 2 catheter was removed over the wire. Intra arterial infusion of 5000 units of heparin, 200 mcg of nitroglycerin and 5 mg of verapamil was performed to the right radial artery via sheath side port. Then, a benchmark guide catheter was advanced over the wire and under fluoroscopic guidance into the distal V2 segment of the right vertebral artery. Magnified frontal and lateral views of the head were obtained in the working projections. Using biplane roadmap, a headway duo microcatheter was coaxially advanced over a synchro 2 micro guidewire into the right vertebral artery and then navigated into the right posteroinferior cerebellar artery. Frontal and lateral angiograms were obtained with microcatheter contrast injection. The microcatheter was advanced near the origin of the target vessel. Attempted placement of a 2 mm x 4 cm Target 360 Nano coil proved unsuccessful. Follow-up right vertebral artery angiograms with magnified frontal and lateral views of the head were projections obtained. The microcatheter was then advanced and wedged at the origin of the right targeted PICA branch. The microcatheter was then prepped with DMSO. Following, slow injection of onyx 18 was performed under fluoroscopy. Embolization of the target vessel achieved. Small reflux into the PICA noted. The catheter was removed under constant aspiration. Follow-up angiogram showed occlusion of PICA at the telovelotonsillar segment. Right vertebral artery angiograms with frontal and lateral views of the entire head to evidence of distal thromboembolic complication it. Flat panel CT of the  head was obtained and post processed in a separate workstation with concurrent attending physician supervision. Selected images were sent to PACS. No evidence of hemorrhagic complication noted. The catheter was subsequently withdrawn. The right radial sheath was removed and inflatable band was placed over the access site for patent hemostasis. 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 PLAN: 1. Continued observation in ICU. 2. Stat head CT for any neurological deterioration. Patient and spouse communicated in person. Electronically Signed   By: Pedro Earls M.D.   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 Karenann Cai. Patient's condition stable- neurologically intact, headache much improved from yesterday. Right radial puncture site stable, radial pulses 2+ bilaterally. Continue taking Aspirin 325 mg once daily. Further plans per neurology- appreciate and agree with management. NIR to follow.   Electronically Signed: Earley Abide, PA-C 09/21/2019, 8:10 AM   I spent a total of 25 Minutes at the the patient's bedside AND on the  patient's hospital floor or unit, greater than 50% of which was counseling/coordinating care for dissecting right PICA aneurysm s/p embolization.

## 2019-09-21 NOTE — Progress Notes (Signed)
Physical Therapy Treatment Patient Details Name: Andrew Perez MRN: [id number] DOB: [birth year] Today's Date: 09/21/2019    History of Present Illness (P) 54 yo male admitted with IVH of 4th ventricle  PMH HTN  morbid obese previous IVH 2018. 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     PT Comments    Pt remains limited by elevated BP and some HA during session. Pt continues to demonstrate significant balance deviations and requires physical assistance to maintain safety in all OOB mobility. Pt appears to have reduced awareness of deficits, reporting his gait feels "fine" despite it being significantly different that prior to aneurysm coiling. Pt often staring blankly off into space and maintains eyelids widely open throughout session. Pt will continue to benefit form PT POC and mobilization with staff to improve gait quality and to reduce falls risk. PT continues to recommend CIR at this time.   Follow Up Recommendations  CIR;Supervision/Assistance - 24 hour     Equipment Recommendations  Rolling walker with 5" wheels(bariatric RW)    Recommendations for Other Services       Precautions / Restrictions Precautions Precautions: (P) Fall Precaution Comments: (P) systolic VZ<482 Restrictions Weight Bearing Restrictions: (P) No    Mobility  Bed Mobility Overal bed mobility: Needs Assistance Bed Mobility: Supine to Sit     Supine to sit: Supervision Sit to supine: Mod assist   General bed mobility comments: modA to return to supine for LE management  Transfers Overall transfer level: Needs assistance Equipment used: Rolling walker (2 wheeled) Transfers: Sit to/from Stand Sit to Stand: Min assist;+2 physical assistance;From elevated surface(modAx2 from recliner)         General transfer comment: pt requires increased assistance to power up from lower seat, verbal cues provided for hand  placement  Ambulation/Gait Ambulation/Gait assistance: Min assist;+2 physical assistance(with 3rd person for chair follow) Gait Distance (Feet): 12 Feet Assistive device: Rolling walker (2 wheeled) Gait Pattern/deviations: Step-to pattern;Trunk flexed;Staggering left;Staggering right Gait velocity: reduced Gait velocity interpretation: <1.8 ft/sec, indicate of risk for recurrent falls General Gait Details: pt with increased lateral sway drifting and intermittently staggering left/right. Pt with increased trunk flexion and requiring multiple cues to bring RW closer to BOS. Pt will likely benefit from wider bariatric RW at next session to allow closer proximity to his base of support   Stairs             Wheelchair Mobility    Modified Rankin (Stroke Patients Only) Modified Rankin (Stroke Patients Only) Pre-Morbid Rankin Score: No symptoms Modified Rankin: Moderately severe disability     Balance Overall balance assessment: Needs assistance Sitting-balance support: Single extremity supported;Feet supported Sitting balance-Leahy Scale: Fair Sitting balance - Comments: minG at edge of bed   Standing balance support: Bilateral upper extremity supported Standing balance-Leahy Scale: Poor Standing balance comment: minG-minA for static standing balance with BUE support of RW                            Cognition Arousal/Alertness: (P) Awake/alert Behavior During Therapy: (P) Flat affect Overall Cognitive Status: (P) Impaired/Different from baseline Area of Impairment: (P) Attention;Memory;Safety/judgement;Following commands;Awareness;Problem solving                   Current Attention Level: (P) Sustained Memory: (P) Decreased recall of precautions;Decreased short-term memory Following Commands: (P) Follows one step commands inconsistently;Follows one step  commands with increased time Safety/Judgement: (P) Decreased awareness of safety;Decreased awareness of  deficits Awareness: (P) Intellectual Problem Solving: (P) Slow processing;Decreased initiation;Difficulty sequencing General Comments: (P) pt giving very short brisk responses to open ended questions. pt state vision is not blurry but then states back of his head is blurry. pt then with more cueing states his vision is blurry until he focuses on the object then its not blurry . pt reports its been happening. when pushed to define states few days pt then further states it started after the coiling procedure.       Exercises      General Comments General comments (skin integrity, edema, etc.): BP pre-mobility 167/74, RN made aware and provides BP meds. BP with transfer to chair 168/68, pt reports 5/10 HA. BP after ambulation 171/78, pt reports HA remains same. BP with return to supine 155/54.      Pertinent Vitals/Pain Pain Assessment: (P) 0-10 Pain Score: (P) 5  Pain Location: (P) head Pain Descriptors / Indicators: (P) Aching Pain Intervention(s): (P) Monitored during session;Repositioned;Patient requesting pain meds-RN notified    Home Living                      Prior Function            PT Goals (current goals can now be found in the care plan section) Acute Rehab PT Goals Patient Stated Goal: (P) To return to independent mobility Progress towards PT goals: Progressing toward goals    Frequency    Min 4X/week      PT Plan Current plan remains appropriate    Co-evaluation PT/OT/SLP Co-Evaluation/Treatment: Yes Reason for Co-Treatment: (P) Complexity of the patient's impairments (multi-system involvement);To address functional/ADL transfers PT goals addressed during session: Mobility/safety with mobility;Balance;Proper use of DME OT goals addressed during session: (P) ADL's and self-care;Strengthening/ROM;Proper use of Adaptive equipment and DME      AM-PAC PT "6 Clicks" Mobility   Outcome Measure  Help needed turning from your back to your side while in  a flat bed without using bedrails?: None Help needed moving from lying on your back to sitting on the side of a flat bed without using bedrails?: A Little Help needed moving to and from a bed to a chair (including a wheelchair)?: A Little Help needed standing up from a chair using your arms (e.g., wheelchair or bedside chair)?: A Little Help needed to walk in hospital room?: A Lot Help needed climbing 3-5 steps with a railing? : A Lot 6 Click Score: 17    End of Session   Activity Tolerance: Patient limited by pain Patient left: in bed;with call bell/phone within reach;with bed alarm set Nurse Communication: Mobility status PT Visit Diagnosis: Unsteadiness on feet (R26.81);Other abnormalities of gait and mobility (R26.89);Other symptoms and signs involving the nervous system (R29.898)     Time: 1610-9604 PT Time Calculation (min) (ACUTE ONLY): 25 min  Charges:  $Gait Training: 8-22 mins                     Zenaida Niece, PT, DPT Acute Rehabilitation Pager: [phone]    Zenaida Niece 09/21/2019, 4:32 PM

## 2019-09-22 LAB — CBC
HCT: 39.3 % (ref 39.0–52.0)
Hemoglobin: 13.3 g/dL (ref 13.0–17.0)
MCH: 29.7 pg (ref 26.0–34.0)
MCHC: 33.8 g/dL (ref 30.0–36.0)
MCV: 87.7 fL (ref 80.0–100.0)
Platelets: 268 10*3/uL (ref 150–400)
RBC: 4.48 MIL/uL (ref 4.22–5.81)
RDW: 15.1 % (ref 11.5–15.5)
WBC: 13.5 10*3/uL — ABNORMAL HIGH (ref 4.0–10.5)
nRBC: 0.1 % (ref 0.0–0.2)

## 2019-09-22 LAB — BASIC METABOLIC PANEL
Anion gap: 12 (ref 5–15)
BUN: 15 mg/dL (ref 6–20)
CO2: 23 mmol/L (ref 22–32)
Calcium: 9 mg/dL (ref 8.9–10.3)
Chloride: 98 mmol/L (ref 98–111)
Creatinine, Ser: 0.84 mg/dL (ref 0.61–1.24)
GFR calc Af Amer: >60 mL/min (ref >60)
GFR calc non Af Amer: >60 mL/min (ref >60)
Glucose, Bld: 128 mg/dL — ABNORMAL HIGH (ref 70–99)
Potassium: 4.3 mmol/L (ref 3.5–5.1)
Sodium: 133 mmol/L — ABNORMAL LOW (ref 135–145)

## 2019-09-22 MED ORDER — LABETALOL HCL 5 MG/ML IV SOLN
10.0000 mg | INTRAVENOUS | Status: DC | PRN
  Administered 2019-09-22: 10 mg via INTRAVENOUS
  Filled 2019-09-22 (×2): qty 4

## 2019-09-22 MED ORDER — HYDRALAZINE HCL 20 MG/ML IJ SOLN: 10.0000 mg | INTRAMUSCULAR | Status: DC | PRN

## 2019-09-22 MED ORDER — HYDRALAZINE HCL 20 MG/ML IJ SOLN
20.0000 mg | Freq: Four times a day (QID) | INTRAMUSCULAR | Status: DC | PRN
  Administered 2019-09-22 (×3): 20 mg via INTRAVENOUS
  Filled 2019-09-22 (×3): qty 1

## 2019-09-22 NOTE — Progress Notes (Signed)
STROKE TEAM PROGRESS NOTE   INTERVAL HISTORY  Patient is sitting up in bedside chair.  Blood pressure is still requiring IV Cleviprex drip for control.  He denies any headache.  Vital signs otherwise stable.  No new complaints.   Vitals:   09/22/19 0300 09/22/19 0400 09/22/19 0500 09/22/19 0600  BP: (!) 160/69 (!) 141/63 (!) 150/60 (!) 159/59  Pulse: 64 64 64 63  Resp: 14 15 15 16   Temp:  97.9 F (36.6 C)    TempSrc:  Oral    SpO2: 96% 97% 98% 96%  Weight:      Height:       CBC:  Recent Labs  Lab 09/21/19 0511 09/22/19 0355  WBC 14.4* 13.5*  HGB 12.3* 13.3  HCT 36.9* 39.3  MCV 89.1 87.7  PLT 271 016   Basic Metabolic Panel:  Recent Labs  Lab 09/21/19 0511 09/22/19 0355  NA 133* 133*  K 4.0 4.3  CL 101 98  CO2 24 23  GLUCOSE 136* 128*  BUN 17 15  CREATININE 0.87 0.84  CALCIUM 8.8* 9.0   Lipid Panel:     Component Value Date/Time   CHOL 159 09/19/2019 0515   TRIG 100 09/19/2019 0515   HDL 35 (L) 09/19/2019 0515   CHOLHDL 4.5 09/19/2019 0515   VLDL 20 09/19/2019 0515   LDLCALC 104 (H) 09/19/2019 0515   HgbA1c:  Lab Results  Component Value Date   HGBA1C 4.7 (L) 09/18/2019   Urine Drug Screen:     Component Value Date/Time   LABOPIA POSITIVE (A) 09/17/2019 2126   COCAINSCRNUR NONE DETECTED 09/17/2019 2126   LABBENZ NONE DETECTED 09/17/2019 2126   AMPHETMU NONE DETECTED 09/17/2019 2126   THCU NONE DETECTED 09/17/2019 2126   LABBARB NONE DETECTED 09/17/2019 2126    Alcohol Level No results found for: ETH  IMAGING past 24 hours  CT HEAD WO CONTRAST 09/20/2019 IMPRESSION:  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 No hydrocephalus or interval hemorrhage.   MR BRAIN WO CONTRAST 09/20/2019 IMPRESSION:  Evolving bilateral PICA territory infarct, similar in extension to prior CT. There is no hydrocephalus.    PHYSICAL EXAM    Temp:  [97.6 F (36.4 C)-99 F (37.2 C)] 97.9 F (36.6 C) (06/06 0400) Pulse Rate:  [60-80] 63 (06/06  0600) Resp:  [14-27] 16 (06/06 0600) BP: (127-188)/(54-89) 159/59 (06/06 0600) SpO2:  [87 %-100 %] 96 % (06/06 0600)  General -morbidly obese middle-aged Caucasian male well developed, not in acute distress.  Ophthalmologic - fundi not visualized due to noncooperation.  Cardiovascular - Regular rhythm and rate.  Neuro - awake alert and oriented to time place and person.. No aphasia, but paucity of speech, no dysarthria. Able to name and repeat. PERRL, EOMI, no nystagmus on bilateral gaze.  Facial symmetrical. visual field full. Tongue midline. Moving all extremities equally. B/l FTN intact no ataxia. Sensation intact, gait not tested.    ASSESSMENT/PLAN [name] is a 54 y.o. 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 SBP > 190.  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 small volume IVH in 4th ventricle. Interval resection  previous central skull base infiltrative mass. 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 No hydrocephalus. Known sellar mass w/ skull base erosion.   Cerebral angio right PICA 54mm aneurysm   MRI - Evolving bilateral PICA territory infarct, similar in extension to prior CT. There is no hydrocephalus.   2D Echo EF 55-60%. No source of embolus. LA moderate dilated  LDL 104  HgbA1c 4.7  UDS + opiates  SCDs for VTE prophylaxis  No antithrombotic prior to admission, was on No antithrombotic given hemorrhage. Post aneurysm embolization, now off heparin IV, on ASA 325.   Therapy recommendations:   CIR  Disposition:  Pending  Right PICA aneurysm  Cerebral angiogram showed right PICA 5 mm aneurysm  Aneurysm management with right PICA embolization 6/3  Expecting small right cerebellar infarct  CT bilateral cerebellar infarct, right more than left  MRI confirmed bilateral PICA infarcts, right more than left.  No hydrocephalus  Off heparin IV, on aspirin 325  History of IVH  2018 - admitted for headache, MRI and CT showed IVH involving the 4th ventricle, and old right BG infarct.  Carotid Doppler negative.  EF 55 to 60%.  LDL 27 and A1c 4.6.  CTA head at that time showed right PICA patent.  Prolactinoma    Followed by Dr. Maurie Boettcher and endocrinologist at [facility]  On cabergoline therapy since March 2018 with decrease in mass size on follow-up imaging  Being followed by surveillance with MRIs  Cabergoline every Wed and Sat  Hypertensive Emergency  SBP > 190 on arrival   Home meds:  amlodipine 10, hydralazine 100 q8, losartan 100  Now still on cleviprex   Resumed home medication  Add HCTZ 50 and clonidine 0.1 tid -> 0.2 tid . SBP goal <160 (SBP 141 - 160 today) . Long-term BP goal normotensive  Hyperlipidemia  LDL 104  on no statin PTA  Add lipitor 40 in the setting of stroke  Continue statin on discharge.  Other Stroke Risk Factors  Morbid Obesity, Body mass index is 58.36 kg/m., recommend weight loss, diet and exercise as appropriate   Obstructive sleep apnea  Other Active Problems  Hypothyroid on synthroid  Leukocytosis - 14.6->14.4 (afebrile)->13.5  Mild hyponatremia - 133->133   Hospital day # 5  Continue to wean IV Cleviprex drip and use as needed IV hydralazine and continue oral blood pressure medications.  Mobilize out of bed.  Continue ongoing therapies.  Consider transfer to neurology floor bed when he is off Cleviprex drip.  No family available for discussion.This patient is critically ill and at significant risk of  neurological worsening, death and care requires constant monitoring of vital signs, hemodynamics,respiratory and cardiac monitoring, extensive review of multiple databases, frequent neurological assessment, discussion with family, other specialists and medical decision making of high complexity.I have made any additions or clarifications directly to the above note.This critical care time does not reflect procedure time, or teaching time or supervisory time of PA/NP/Med Resident etc but could involve care discussion time.  I spent 30 minutes of neurocritical care time  in the care of  this patient.    Antony Contras, MD Medical Director [facility] Pager: [phone] 09/22/2019 2:18 PM  To contact Stroke Continuity provider, please refer to [email or web address] After hours, contact General Neurology

## 2019-09-22 NOTE — Progress Notes (Signed)
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 Karenann Cai. Patient laying in bed resting comfortably, CPAP in place. He responds to voice, answers questions appropriately, and follows simple commands. No complaints. Right radial puncture site c/d/i.   Allergies: Codeine and Shellfish allergy  Medications: Prior to Admission medications   Medication Sig Start Date End Date Taking? Authorizing Provider  amLODipine (NORVASC) 10 MG tablet Take 1 tablet (10 mg total) by mouth daily. 07/04/16  Yes Rinehuls, Early Chars, PA-C  cabergoline (DOSTINEX) 0.5 MG tablet Take 0.5 mg by mouth 2 (two) times a week. Saturday and wednesday 06/24/16  Yes [provider]  cloNIDine (CATAPRES - DOSED IN MG/24 HR) 0.3 mg/24hr patch Place 0.3 mg onto the skin every 7 (seven) days. Saturday 03/12/18  Yes [provider]  hydrALAZINE (APRESOLINE) 100 MG tablet Take 1 tablet (100 mg total) by mouth every 8 (eight) hours. Patient taking differently: Take 100 mg by mouth 3 (three) times daily.  07/03/16  Yes Rinehuls, Early Chars, PA-C  levothyroxine (SYNTHROID, LEVOTHROID) 100 MCG tablet Take 100 mcg by mouth daily. 02/22/18  Yes [provider]  losartan (COZAAR) 100 MG tablet Take 100 mg by mouth daily. 03/20/18  Yes [provider]  cloNIDine (CATAPRES) 0.1 MG tablet One tablet three times daily Patient not taking: Reported on 04/13/2018 07/03/16   Rinehuls, Early Chars, PA-C  lisinopril (PRINIVIL,ZESTRIL) 20 MG tablet Take 1 tablet (20 mg total) by mouth 2 (two) times daily. Patient not taking: Reported on 04/13/2018 07/03/16   Rinehuls, Early Chars, PA-C     Vital Signs: BP (!)  149/69   Pulse 63   Temp 97.9 F (36.6 C) (Oral)   Resp 15   Ht _0  (1.803 m)   Wt (!) 418 lb 6.9 oz (189.8 kg)   SpO2 96%   BMI 58.36 kg/m   Physical Exam Vitals and nursing note reviewed.  Constitutional:      General: He is not in acute distress.    Appearance: Normal appearance.  Pulmonary:     Effort: Pulmonary effort is normal. No respiratory distress.  Skin:    General: Skin is warm and dry.     Comments: Right radial incision soft without active bleeding or hematoma.  Neurological:     Mental Status: He is alert.     Comments: Alert, awake, and oriented x3. Speech and comprehension intact. PERRL bilaterally. EOMs intact bilaterally without nystagmus. Can spontaneously move all extremities, hand grip strength equal bilaterally. No pronator drift. Radial pulses 2+ bilaterally.      Imaging: CT HEAD WO CONTRAST  Result Date: 09/20/2019 CLINICAL DATA:  Stroke follow-up EXAM: CT HEAD WITHOUT CONTRAST TECHNIQUE: Contiguous axial images were obtained from the base of the skull through the vertex without intravenous contrast. 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 Onyx is seen at the level of vessel embolization. Narrowing of the lower fourth ventricle without hydrocephalus. There is small volume intraventricular hemorrhage which is not progressed. Vascular: As above Skull: No acute or aggressive finding. Sinuses/Orbits: Right mastoid and middle ear opacification. 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 No hydrocephalus or  interval hemorrhage. Electronically Signed   By: Monte Fantasia M.D.   On: 09/20/2019 07:13   MR BRAIN WO CONTRAST  Result Date: 09/20/2019 CLINICAL DATA:  Stroke follow-up. EXAM: MRI HEAD WITHOUT CONTRAST TECHNIQUE: Multiplanar, multiecho pulse sequences of the brain and surrounding structures were obtained without intravenous  contrast. COMPARISON:  Head CT performed on September 20, 2019 at 6:48 a.m. MRI of the brain June 28, 2016. FINDINGS: Brain: Area of restricted diffusion is seen involving predominantly the right PICA territory with extension to the medial aspect of the left inferior left cerebellar hemisphere, in a similar extension as compared to recent head CT. No brainstem involvement. No hydrocephalus. Susceptibility artifact within the fourth ventricles and in the dependent portion of the bilateral occipital horns represent known intraventricular hemorrhage. More linear appearing susceptibility artifact in the fourth ventricle/inferior aspect of the cerebellar hemisphere corresponds to liquid embolic cast. Mild increase FLAIR signal within the cerebral sulci may reflect minimal residual subarachnoid hemorrhage from blood redistribution. Unchanged small remote right putaminal infarct and few scattered white matter foci of T2 hyperintensity. Enlarged sella with erosion of the clivus with predominantly T2 hyperintense solid cystic mass lesion and anterior displacement of the right carotid siphon which is encased by the mass, without stenosis. Compared to prior study, the lesion appear to have increased T2 signal with increased cystic component. Vascular: Normal flow voids, noting that the posteroinferior cerebellar artery is too small to be accurately evaluated with this technique. Skull and upper cervical spine: Normal marrow signal. Sinuses/Orbits: Mucous retention cyst in the left maxillary sinus. Mild mucosal thickening of the ethmoid cells. Other: Small amount of fluid in the right mastoid cells. Decreased diameter of the nasopharynx which may be associated with obstructive sleep apnea. IMPRESSION: Evolving bilateral PICA territory infarct, similar in extension to prior CT. There is no hydrocephalus. These results were called by telephone at the time of interpretation on 09/20/2019 at 1:38 pm to provider [facility] , who verbally  acknowledged these results. Electronically Signed   By: Pedro Earls M.D.   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 He presented with acute onset of posterior headache, dizziness and vomiting on 09/17/2019. 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 EXAM: DIAGNOSTIC CEREBRAL ANGIOGRAM AND ENDOVASCULAR EMBOLIZATION COMPARISON:  Diagnostic cerebral angiogram 09/18/2019 MEDICATIONS: No antibiotic administered. ANESTHESIA/SEDATION: The procedure was performed under general anesthesia. FLUOROSCOPY TIME:  Fluoroscopy Time: 74 minutes 42 seconds (5436 mGy). COMPLICATIONS: SIR LEVEL B - Normal therapy, includes overnight admission for observation. TECHNIQUE: Informed written consent was obtained from the patient after a thorough discussion of the procedural risks, benefits and alternatives. All questions were addressed. Maximal Sterile Barrier Technique was utilized including caps, mask, sterile gowns, sterile gloves, sterile drape, hand hygiene and skin antiseptic. A timeout was performed prior to the initiation of the procedure. Real-time ultrasound guidance was utilized for vascular access including the acquisition of a permanent ultrasound image documenting patency of the accessed vessel. Using the modified Seldinger technique and a micropuncture kit, access was gained to the right radial artery at the wrist and a 6 French sheath was placed. Slow intra arterial infusion of 5,000 IU heparin, 5 mg Verapamil and 200 mcg nitroglycerin diluted in patient's  own blood was performed. No significant fluctuation in patient's blood  pressure seen. Then, a right radial artery angiogram was obtained via sheath side port. Normal brachial artery branching pattern seen. No significant anatomical variation. The right radial artery caliber is adequate for vascular access. Then, a 5 [place] Simmons 2 glide catheter was navigated over a 0.035 inch Terumo Glidewire into the right subclavian artery. A right subclavian artery roadmap was obtained. The catheter was then advanced into the V2 segment of the right vertebral artery. Townes and lateral views of the head were obtained centered on the posterior fossa. 3D rotational angiograms were acquired and post processed in a separate workstation under concurrent attending physician supervision. Selected images were sent to PACS. FINDINGS: Again seen is a 5 mm dissecting aneurysm as seen on prior angiogram. However, it appears that this aneurysm originates from a dissected branch off of the telovelotonsillar segment of the right posterior inferior cerebellar artery (PICA). The right vertebral artery, basilar artery, and bilateral posterior cerebral arteries are unremarkable. No abnormally high-flow, early draining veins are seen. No regions of abnormal hypervascularity are noted. The visualized dural sinuses are patent. PROCEDURE: Under biplane roadmap, the Simmons 2 catheter was removed over the wire. Intra arterial infusion of 5000 units of heparin, 200 mcg of nitroglycerin and 5 mg of verapamil was performed to the right radial artery via sheath side port. Then, a benchmark guide catheter was advanced over the wire and under fluoroscopic guidance into the distal V2 segment of the right vertebral artery. Magnified frontal and lateral views of the head were obtained in the working projections. Using biplane roadmap, a headway duo microcatheter was coaxially advanced over a synchro 2 micro guidewire into the right vertebral artery and then navigated into the right posteroinferior cerebellar artery. Frontal and  lateral angiograms were obtained with microcatheter contrast injection. The microcatheter was advanced near the origin of the target vessel. Attempted placement of a 2 mm x 4 cm Target 360 Nano coil proved unsuccessful. Follow-up right vertebral artery angiograms with magnified frontal and lateral views of the head were projections obtained. The microcatheter was then advanced and wedged at the origin of the right targeted PICA branch. The microcatheter was then prepped with DMSO. Following, slow injection of onyx 18 was performed under fluoroscopy. Embolization of the target vessel achieved. Small reflux into the PICA noted. The catheter was removed under constant aspiration. Follow-up angiogram showed occlusion of PICA at the telovelotonsillar segment. Right vertebral artery angiograms with frontal and lateral views of the entire head to evidence of distal thromboembolic complication it. Flat panel CT of the head was obtained and post processed in a separate workstation with concurrent attending physician supervision. Selected images were sent to PACS. No evidence of hemorrhagic complication noted. The catheter was subsequently withdrawn. The right radial sheath was removed and inflatable band was placed over the access site for patent hemostasis. 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 PLAN: 1. Continued observation in ICU. 2. Stat head CT for any neurological deterioration. Patient and spouse communicated in person. Electronically Signed   By: Pedro Earls M.D.   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 He presented with acute onset of posterior headache, dizziness and vomiting on  09/17/2019. Head CT  showed recurrent for ventricular hemorrhage without hydrocephalus. 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 EXAM: DIAGNOSTIC CEREBRAL ANGIOGRAM AND ENDOVASCULAR EMBOLIZATION COMPARISON:  Diagnostic cerebral angiogram 09/18/2019 MEDICATIONS: No antibiotic administered. ANESTHESIA/SEDATION: The procedure was performed under general anesthesia. FLUOROSCOPY TIME:  Fluoroscopy Time: 74 minutes 42 seconds (5436 mGy). COMPLICATIONS: SIR LEVEL B - Normal therapy, includes overnight admission for observation. TECHNIQUE: Informed written consent was obtained from the patient after a thorough discussion of the procedural risks, benefits and alternatives. All questions were addressed. Maximal Sterile Barrier Technique was utilized including caps, mask, sterile gowns, sterile gloves, sterile drape, hand hygiene and skin antiseptic. A timeout was performed prior to the initiation of the procedure. Real-time ultrasound guidance was utilized for vascular access including the acquisition of a permanent ultrasound image documenting patency of the accessed vessel. Using the modified Seldinger technique and a micropuncture kit, access was gained to the right radial artery at the wrist and a 6 French sheath was placed. Slow intra arterial infusion of 5,000 IU heparin, 5 mg Verapamil and 200 mcg nitroglycerin diluted in patient's own blood was performed. No significant fluctuation in patient's blood pressure seen. Then, a right radial artery angiogram was obtained via sheath side port. Normal brachial artery branching pattern seen. No significant anatomical variation. The right radial artery caliber is adequate for vascular access. Then, a 5 [place] Simmons 2 glide catheter was navigated over a 0.035 inch Terumo Glidewire into the right subclavian artery. A right subclavian artery roadmap was obtained. The catheter was then advanced  into the V2 segment of the right vertebral artery. Townes and lateral views of the head were obtained centered on the posterior fossa. 3D rotational angiograms were acquired and post processed in a separate workstation under concurrent attending physician supervision. Selected images were sent to PACS. FINDINGS: Again seen is a 5 mm dissecting aneurysm as seen on prior angiogram. However, it appears that this aneurysm originates from a dissected branch off of the telovelotonsillar segment of the right posterior inferior cerebellar artery (PICA). The right vertebral artery, basilar artery, and bilateral posterior cerebral arteries are unremarkable. No abnormally high-flow, early draining veins are seen. No regions of abnormal hypervascularity are noted. The visualized dural sinuses are patent. PROCEDURE: Under biplane roadmap, the Simmons 2 catheter was removed over the wire. Intra arterial infusion of 5000 units of heparin, 200 mcg of nitroglycerin and 5 mg of verapamil was performed to the right radial artery via sheath side port. Then, a benchmark guide catheter was advanced over the wire and under fluoroscopic guidance into the distal V2 segment of the right vertebral artery. Magnified frontal and lateral views of the head were obtained in the working projections. Using biplane roadmap, a headway duo microcatheter was coaxially advanced over a synchro 2 micro guidewire into the right vertebral artery and then navigated into the right posteroinferior cerebellar artery. Frontal and lateral angiograms were obtained with microcatheter contrast injection. The microcatheter was advanced near the origin of the target vessel. Attempted placement of a 2 mm x 4 cm Target 360 Nano coil proved unsuccessful. Follow-up right vertebral artery angiograms with magnified frontal and lateral views of the head were projections obtained. The microcatheter was then advanced and wedged at the origin of the right targeted PICA branch.  The microcatheter was then prepped with DMSO. Following, slow injection of onyx 18 was performed under fluoroscopy. Embolization of the target vessel achieved. Small reflux into the PICA noted. The  catheter was removed under constant aspiration. Follow-up angiogram showed occlusion of PICA at the telovelotonsillar segment. Right vertebral artery angiograms with frontal and lateral views of the entire head to evidence of distal thromboembolic complication it. Flat panel CT of the head was obtained and post processed in a separate workstation with concurrent attending physician supervision. Selected images were sent to PACS. No evidence of hemorrhagic complication noted. The catheter was subsequently withdrawn. The right radial sheath was removed and inflatable band was placed over the access site for patent hemostasis. 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 PLAN: 1. Continued observation in ICU. 2. Stat head CT for any neurological deterioration. Patient and spouse communicated in person. Electronically Signed   By: Pedro Earls M.D.   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 He presented with acute onset of posterior headache, dizziness and vomiting on 09/17/2019. 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 EXAM: DIAGNOSTIC CEREBRAL ANGIOGRAM AND ENDOVASCULAR EMBOLIZATION COMPARISON:  Diagnostic cerebral angiogram 09/18/2019 MEDICATIONS: No antibiotic administered. ANESTHESIA/SEDATION: The procedure was performed under general anesthesia.  FLUOROSCOPY TIME:  Fluoroscopy Time: 74 minutes 42 seconds (5436 mGy). COMPLICATIONS: SIR LEVEL B - Normal therapy, includes overnight admission for observation. TECHNIQUE: Informed written consent was obtained from the patient after a thorough discussion of the procedural risks, benefits and alternatives. All questions were addressed. Maximal Sterile Barrier Technique was utilized including caps, mask, sterile gowns, sterile gloves, sterile drape, hand hygiene and skin antiseptic. A timeout was performed prior to the initiation of the procedure. Real-time ultrasound guidance was utilized for vascular access including the acquisition of a permanent ultrasound image documenting patency of the accessed vessel. Using the modified Seldinger technique and a micropuncture kit, access was gained to the right radial artery at the wrist and a 6 French sheath was placed. Slow intra arterial infusion of 5,000 IU heparin, 5 mg Verapamil and 200 mcg nitroglycerin diluted in patient's own blood was performed. No significant fluctuation in patient's blood pressure seen. Then, a right radial artery angiogram was obtained via sheath side port. Normal brachial artery branching pattern seen. No significant anatomical variation. The right radial artery caliber is adequate for vascular access. Then, a 5 [place] Simmons 2 glide catheter was navigated over a 0.035 inch Terumo Glidewire into the right subclavian artery. A right subclavian artery roadmap was obtained. The catheter was then advanced into the V2 segment of the right vertebral artery. Townes and lateral views of the head were obtained centered on the posterior fossa. 3D rotational angiograms were acquired and post processed in a separate workstation under concurrent attending physician supervision. Selected images were sent to PACS. FINDINGS: Again seen is a 5 mm dissecting aneurysm as seen on prior angiogram. However, it appears that this aneurysm originates from a dissected  branch off of the telovelotonsillar segment of the right posterior inferior cerebellar artery (PICA). The right vertebral artery, basilar artery, and bilateral posterior cerebral arteries are unremarkable. No abnormally high-flow, early draining veins are seen. No regions of abnormal hypervascularity are noted. The visualized dural sinuses are patent. PROCEDURE: Under biplane roadmap, the Simmons 2 catheter was removed over the wire. Intra arterial  infusion of 5000 units of heparin, 200 mcg of nitroglycerin and 5 mg of verapamil was performed to the right radial artery via sheath side port. Then, a benchmark guide catheter was advanced over the wire and under fluoroscopic guidance into the distal V2 segment of the right vertebral artery. Magnified frontal and lateral views of the head were obtained in the working projections. Using biplane roadmap, a headway duo microcatheter was coaxially advanced over a synchro 2 micro guidewire into the right vertebral artery and then navigated into the right posteroinferior cerebellar artery. Frontal and lateral angiograms were obtained with microcatheter contrast injection. The microcatheter was advanced near the origin of the target vessel. Attempted placement of a 2 mm x 4 cm Target 360 Nano coil proved unsuccessful. Follow-up right vertebral artery angiograms with magnified frontal and lateral views of the head were projections obtained. The microcatheter was then advanced and wedged at the origin of the right targeted PICA branch. The microcatheter was then prepped with DMSO. Following, slow injection of onyx 18 was performed under fluoroscopy. Embolization of the target vessel achieved. Small reflux into the PICA noted. The catheter was removed under constant aspiration. Follow-up angiogram showed occlusion of PICA at the telovelotonsillar segment. Right vertebral artery angiograms with frontal and lateral views of the entire head to evidence of distal thromboembolic  complication it. Flat panel CT of the head was obtained and post processed in a separate workstation with concurrent attending physician supervision. Selected images were sent to PACS. No evidence of hemorrhagic complication noted. The catheter was subsequently withdrawn. The right radial sheath was removed and inflatable band was placed over the access site for patent hemostasis. 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 PLAN: 1. Continued observation in ICU. 2. Stat head CT for any neurological deterioration. Patient and spouse communicated in person. Electronically Signed   By: Pedro Earls M.D.   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 He presented with acute onset of posterior headache, dizziness and vomiting on 09/17/2019. 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 EXAM: DIAGNOSTIC CEREBRAL ANGIOGRAM AND ENDOVASCULAR EMBOLIZATION COMPARISON:  Diagnostic cerebral angiogram 09/18/2019 MEDICATIONS: No antibiotic administered. ANESTHESIA/SEDATION: The procedure was performed under general anesthesia. FLUOROSCOPY TIME:  Fluoroscopy Time: 74 minutes 42 seconds (5436 mGy). COMPLICATIONS: SIR LEVEL B - Normal therapy, includes overnight admission for observation. TECHNIQUE: Informed written consent was obtained from the patient after a thorough discussion of the procedural risks, benefits and alternatives. All questions were addressed. Maximal Sterile Barrier Technique was utilized including caps, mask, sterile gowns, sterile gloves, sterile drape,  hand hygiene and skin antiseptic. A timeout was performed prior to the initiation of the procedure. Real-time ultrasound guidance was utilized for vascular access including the acquisition of a permanent ultrasound image documenting patency of the accessed vessel. Using the modified Seldinger technique and a micropuncture kit, access was gained to the right radial artery at the wrist and a 6 French sheath was placed. Slow intra arterial infusion of 5,000 IU heparin, 5 mg Verapamil and 200 mcg nitroglycerin diluted in patient's own blood was performed. No significant fluctuation in patient's blood pressure seen. Then, a right radial artery  angiogram was obtained via sheath side port. Normal brachial artery branching pattern seen. No significant anatomical variation. The right radial artery caliber is adequate for vascular access. Then, a 5 [place] Simmons 2 glide catheter was navigated over a 0.035 inch Terumo Glidewire into the right subclavian artery. A right subclavian artery roadmap was obtained. The catheter was then advanced into the V2 segment of the right vertebral artery. Townes and lateral views of the head were obtained centered on the posterior fossa. 3D rotational angiograms were acquired and post processed in a separate workstation under concurrent attending physician supervision. Selected images were sent to PACS. FINDINGS: Again seen is a 5 mm dissecting aneurysm as seen on prior angiogram. However, it appears that this aneurysm originates from a dissected branch off of the telovelotonsillar segment of the right posterior inferior cerebellar artery (PICA). The right vertebral artery, basilar artery, and bilateral posterior cerebral arteries are unremarkable. No abnormally high-flow, early draining veins are seen. No regions of abnormal hypervascularity are noted. The visualized dural sinuses are patent. PROCEDURE: Under biplane roadmap, the Simmons 2 catheter was removed over the wire. Intra arterial  infusion of 5000 units of heparin, 200 mcg of nitroglycerin and 5 mg of verapamil was performed to the right radial artery via sheath side port. Then, a benchmark guide catheter was advanced over the wire and under fluoroscopic guidance into the distal V2 segment of the right vertebral artery. Magnified frontal and lateral views of the head were obtained in the working projections. Using biplane roadmap, a headway duo microcatheter was coaxially advanced over a synchro 2 micro guidewire into the right vertebral artery and then navigated into the right posteroinferior cerebellar artery. Frontal and lateral angiograms were obtained with microcatheter contrast injection. The microcatheter was advanced near the origin of the target vessel. Attempted placement of a 2 mm x 4 cm Target 360 Nano coil proved unsuccessful. Follow-up right vertebral artery angiograms with magnified frontal and lateral views of the head were projections obtained. The microcatheter was then advanced and wedged at the origin of the right targeted PICA branch. The microcatheter was then prepped with DMSO. Following, slow injection of onyx 18 was performed under fluoroscopy. Embolization of the target vessel achieved. Small reflux into the PICA noted. The catheter was removed under constant aspiration. Follow-up angiogram showed occlusion of PICA at the telovelotonsillar segment. Right vertebral artery angiograms with frontal and lateral views of the entire head to evidence of distal thromboembolic complication it. Flat panel CT of the head was obtained and post processed in a separate workstation with concurrent attending physician supervision. Selected images were sent to PACS. No evidence of hemorrhagic complication noted. The catheter was subsequently withdrawn. The right radial sheath was removed and inflatable band was placed over the access site for patent hemostasis. 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 PLAN: 1. Continued observation in ICU. 2. Stat head CT for any neurological deterioration. Patient and spouse communicated in person. Electronically Signed   By: Pedro Earls M.D.   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 He presented with acute onset of posterior headache, dizziness and vomiting on 09/17/2019. 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 Diagnostic cerebral angiogram performed on 09/18/2019 showed a dissected right  PICA aneurysm, most likely the source of bleed. EXAM: DIAGNOSTIC CEREBRAL ANGIOGRAM AND ENDOVASCULAR EMBOLIZATION COMPARISON:  Diagnostic cerebral angiogram 09/18/2019 MEDICATIONS: No antibiotic administered. ANESTHESIA/SEDATION: The procedure was performed under general anesthesia. FLUOROSCOPY TIME:  Fluoroscopy Time: 74 minutes 42 seconds (5436 mGy). COMPLICATIONS: SIR LEVEL B - Normal therapy, includes overnight admission for observation. TECHNIQUE: Informed written consent was obtained from the patient after a thorough discussion of the procedural risks, benefits and alternatives. All questions were addressed. Maximal Sterile Barrier Technique was utilized including caps, mask, sterile gowns, sterile gloves, sterile drape, hand hygiene and skin antiseptic. A timeout was performed prior to the initiation of the procedure. Real-time ultrasound guidance was utilized for vascular access including the acquisition of a permanent ultrasound image documenting patency of the accessed vessel. Using the modified Seldinger technique and a micropuncture kit, access was gained to the right radial artery at the wrist and a 6 French sheath was placed. Slow intra  arterial infusion of 5,000 IU heparin, 5 mg Verapamil and 200 mcg nitroglycerin diluted in patient's own blood was performed. No significant fluctuation in patient's blood pressure seen. Then, a right radial artery angiogram was obtained via sheath side port. Normal brachial artery branching pattern seen. No significant anatomical variation. The right radial artery caliber is adequate for vascular access. Then, a 5 [place] Simmons 2 glide catheter was navigated over a 0.035 inch Terumo Glidewire into the right subclavian artery. A right subclavian artery roadmap was obtained. The catheter was then advanced into the V2 segment of the right vertebral artery. Townes and lateral views of the head were obtained centered on the posterior fossa. 3D rotational angiograms were acquired and post processed in a separate workstation under concurrent attending physician supervision. Selected images were sent to PACS. FINDINGS: Again seen is a 5 mm dissecting aneurysm as seen on prior angiogram. However, it appears that this aneurysm originates from a dissected branch off of the telovelotonsillar segment of the right posterior inferior cerebellar artery (PICA). The right vertebral artery, basilar artery, and bilateral posterior cerebral arteries are unremarkable. No abnormally high-flow, early draining veins are seen. No regions of abnormal hypervascularity are noted. The visualized dural sinuses are patent. PROCEDURE: Under biplane roadmap, the Simmons 2 catheter was removed over the wire. Intra arterial infusion of 5000 units of heparin, 200 mcg of nitroglycerin and 5 mg of verapamil was performed to the right radial artery via sheath side port. Then, a benchmark guide catheter was advanced over the wire and under fluoroscopic guidance into the distal V2 segment of the right vertebral artery. Magnified frontal and lateral views of the head were obtained in the working projections. Using biplane roadmap, a headway duo  microcatheter was coaxially advanced over a synchro 2 micro guidewire into the right vertebral artery and then navigated into the right posteroinferior cerebellar artery. Frontal and lateral angiograms were obtained with microcatheter contrast injection. The microcatheter was advanced near the origin of the target vessel. Attempted placement of a 2 mm x 4 cm Target 360 Nano coil proved unsuccessful. Follow-up right vertebral artery angiograms with magnified frontal and lateral views of the head were projections obtained. The microcatheter was then advanced and wedged at the origin of the right targeted PICA branch. The microcatheter was then prepped with DMSO. Following, slow injection of onyx 18 was performed under fluoroscopy. Embolization of the target vessel achieved. Small reflux into the PICA noted. The catheter was removed under constant aspiration. Follow-up angiogram showed occlusion of PICA at the telovelotonsillar segment. Right  vertebral artery angiograms with frontal and lateral views of the entire head to evidence of distal thromboembolic complication it. Flat panel CT of the head was obtained and post processed in a separate workstation with concurrent attending physician supervision. Selected images were sent to PACS. No evidence of hemorrhagic complication noted. The catheter was subsequently withdrawn. The right radial sheath was removed and inflatable band was placed over the access site for patent hemostasis. 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 PLAN: 1. Continued observation in ICU. 2. Stat head CT for any neurological deterioration. Patient and spouse communicated in person. Electronically Signed   By: Pedro Earls M.D.   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 He presented with acute onset of posterior headache, dizziness and vomiting on 09/17/2019. 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 Given recurrence of hemorrhage in the same location, further investigation with a diagnostic cerebral angiogram was recommended. EXAM: Diagnostic cerebral angiogram COMPARISON:  CT angiogram of the head July 01, 2016 MEDICATIONS: No antibiotics given. ANESTHESIA/SEDATION: Versed 0.5 mg IV; Fentanyl 50 mcg IV Moderate Sedation Time:  60 The patient was continuously monitored during the procedure by the interventional radiology nurse under my direct supervision. FLUOROSCOPY TIME:  Fluoroscopy Time: 14 minutes 30 seconds (1656 mGy). COMPLICATIONS: None immediate. TECHNIQUE: Informed written consent was obtained from the patient after a thorough discussion of the procedural risks, benefits and alternatives. All questions were addressed. Maximal Sterile Barrier Technique was utilized including caps, mask, sterile gowns, sterile gloves, sterile drape, hand hygiene and skin antiseptic. A timeout was performed prior to the initiation of the procedure. Using the modified Seldinger technique and a micropuncture kit, access was gained to the right radial artery at the wrist and a 5 French sheath was placed. Slow intra arterial infusion of 5,000 IU heparin, 5 mg Verapamil and 382 mcg nitroglicerin diluted in patient's own blood was performed. No significant fluctuation in patient's blood pressure seen. Then, a right radial artery angiogram was obtained via sheath side port. Normal brachial artery branching pattern seen. No significant anatomical variation. The right radial artery caliber is adequate for vascular access. Then, a 5 [place] Simmons 2 glide catheter was navigated over a 0.035 inch Terumo Glidewire into the right subclavian artery. A right  subclavian artery roadmap was obtained. The catheter was then advanced into the V2 segment of the right vertebral artery. Townes, lateral and bilateral magnified oblique views of the head were obtained centered on the posterior fossa. The catheter was then placed into the aortic arch and then advanced into the left common carotid artery. Left anterior oblique view of the neck was obtained followed by frontal, lateral and bilateral oblique views of the head. The catheter was then placed into the left subclavian artery. Frontal and lateral angiograms of the head were obtained followed by frontal the angiogram of the neck. The catheter was subsequently placed in the right common carotid artery. Right anterior oblique view of the neck was obtained. Following, frontal, lateral and bilateral oblique views of the head were obtained. The catheter was subsequently withdrawn. The right radial artery sheath was removed and inflatable band was placed over the access site for patent hemostasis. FINDINGS: Right radial artery ultrasound and right radial artery  angiogram: The caliber of the distal right radial artery is appropriate for angiogram access. The right radial artery and the right ulnar artery have normal course and caliber. No significant anatomical variants noted. Right vertebral artery angiograms: The right vertebral artery, basilar artery, and bilateral posterior cerebral arteries are unremarkable. 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 No abnormally high-flow, early draining veins are seen. No regions of abnormal hypervascularity are noted. The visualized dural sinuses are patent. Left CCA angiograms: Cervical angiograms show normal course and caliber of the visualized left common carotid and internal carotid arteries. There are no significant stenoses. There is brisk vascular contrast filling of the the ACA and  MCA vascular trees. Luminal caliber is smooth and tapering. No aneurysms or abnormally high-flow, early draining veins are seen. No regions of abnormal hypervascularity are noted. The intracranial branches of the left external carotid artery are unremarkable. The visualized dural sinuses are patent. Left subclavian and thyrocervical angiograms: The visualized subclavian artery, proximal left vertebral artery and visualized branches of the thyrocervical trunk are unremarkable. Suboptimal opacification of the intracranial vasculature obtained. The left vertebral artery is codominant and of normal caliber. Right CCA angiograms: Cervical angiograms show normal course and caliber of the visualized right common carotid and internal carotid arteries. There are no significant stenoses. There is brisk vascular contrast filling of the ACA and MCA vascular trees. Luminal caliber is smooth and tapering. No aneurysms or abnormally high-flow, early draining veins are seen. No regions of abnormal hypervascularity are noted. The intracranial branches of the right external carotid artery are unremarkable. The visualized dural sinuses are patent. PROCEDURE: Not applicable. 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 PLAN: Angiogram findings discussed with the patient and his wife as well as with Dr. Erlinda Hong. Planned for angiogram with aneurysm embolization tomorrow. Electronically Signed   By: Pedro Earls M.D.   On: 09/19/2019 16:01   ECHOCARDIOGRAM COMPLETE  Result Date: 09/18/2019    ECHOCARDIOGRAM REPORT   Patient Name:   Andrew Perez Date of Exam: 09/18/2019 Medical Rec #:  [id number]     Height:       71.0 in Accession #:    [id number]    Weight:       418.4 lb Date of Birth:  04-04-1966     BSA:          2.886 m Patient Age:    54 years      BP:           124/54 mmHg Patient Gender: M              HR:           62 bpm. Exam Location:  Inpatient Procedure: 2D Echo, Color Doppler, Cardiac Doppler and Intracardiac            Opacification Agent Indications:    Stroke i163.9  History:        Patient has prior history of Echocardiogram examinations, most                 recent 06/28/2016. Risk Factors:Hypertension, Dyslipidemia and                 Sleep Apnea.  Sonographer:    Raquel Sarna Senior RDCS Referring Phys: [id number] ASHISH ARORA  Sonographer Comments: Technically difficult study due to  patient body habitus and poor echo windows. IMPRESSIONS  1. Left ventricular ejection fraction, by estimation, is 55 to 60%. The left ventricle has normal function. The left ventricle has no regional wall motion abnormalities. There is moderate left ventricular hypertrophy. Left ventricular diastolic parameters are consistent with Grade II diastolic dysfunction (pseudonormalization).  2. Right ventricular systolic function is normal. The right ventricular size is mildly enlarged. Tricuspid regurgitation signal is inadequate for assessing PA pressure.  3. Left atrial size was moderately dilated.  4. Right atrial size was mildly dilated.  5. The mitral valve is normal in structure. No evidence of mitral valve regurgitation. No evidence of mitral stenosis.  6. The aortic valve is tricuspid. Aortic valve regurgitation is not visualized. No aortic stenosis is present.  7. Aortic dilatation noted. There is mild dilatation of the ascending aorta measuring 39 mm.  8. The inferior vena cava is dilated in size with <50% respiratory variability, suggesting right atrial pressure of 15 mmHg.  9. Technically difficult study with poor acoustic windows. FINDINGS  Left Ventricle: Left ventricular ejection fraction, by estimation, is 55 to 60%. The left ventricle has normal function. The left ventricle has no regional wall motion abnormalities. Definity contrast agent was given IV to delineate the left ventricular  endocardial borders. The  left ventricular internal cavity size was normal in size. There is moderate left ventricular hypertrophy. Left ventricular diastolic parameters are consistent with Grade II diastolic dysfunction (pseudonormalization). Right Ventricle: The right ventricular size is mildly enlarged. No increase in right ventricular wall thickness. Right ventricular systolic function is normal. Tricuspid regurgitation signal is inadequate for assessing PA pressure. Left Atrium: Left atrial size was moderately dilated. Right Atrium: Right atrial size was mildly dilated. Pericardium: There is no evidence of pericardial effusion. Mitral Valve: The mitral valve is normal in structure. No evidence of mitral valve regurgitation. No evidence of mitral valve stenosis. Tricuspid Valve: The tricuspid valve is normal in structure. Tricuspid valve regurgitation is not demonstrated. Aortic Valve: The aortic valve is tricuspid. Aortic valve regurgitation is not visualized. No aortic stenosis is present. Pulmonic Valve: The pulmonic valve was normal in structure. Pulmonic valve regurgitation is not visualized. Aorta: Aortic dilatation noted. There is mild dilatation of the ascending aorta measuring 39 mm. Venous: The inferior vena cava is dilated in size with less than 50% respiratory variability, suggesting right atrial pressure of 15 mmHg. IAS/Shunts: The interatrial septum was not well visualized.  LEFT VENTRICLE PLAX 2D LVIDd:         5.47 cm  Diastology LVIDs:         2.83 cm  LV e' lateral:   7.72 cm/s LV PW:         1.87 cm  LV E/e' lateral: 14.6 LV IVS:        1.67 cm  LV e' medial:    7.40 cm/s LVOT diam:     2.30 cm  LV E/e' medial:  15.3 LV SV:         94 LV SV Index:   33 LVOT Area:     4.15 cm  RIGHT VENTRICLE RV S prime:     16.90 cm/s TAPSE (M-mode): 3.2 cm LEFT ATRIUM              Index       RIGHT ATRIUM           Index LA diam:        3.50 cm  1.21 cm/m  RA Area:  25.10 cm LA Vol (A2C):   105.0 ml 36.39 ml/m RA Volume:    78.70 ml  27.27 ml/m LA Vol (A4C):   149.0 ml 51.64 ml/m LA Biplane Vol: 138.0 ml 47.82 ml/m  AORTIC VALVE LVOT Vmax:   112.00 cm/s LVOT Vmean:  70.000 cm/s LVOT VTI:    0.227 m  AORTA Ao Root diam: 3.60 cm Ao Asc diam:  3.85 cm MITRAL VALVE MV Area (PHT): 2.80 cm     SHUNTS MV Decel Time: 271 msec     Systemic VTI:  0.23 m MV E velocity: 113.00 cm/s  Systemic Diam: 2.30 cm MV A velocity: 112.00 cm/s MV E/A ratio:  1.01 Loralie Champagne MD Electronically signed by Loralie Champagne MD Signature Date/Time: 09/18/2019/4:52:31 PM    Final    IR ANGIO INTRA EXTRACRAN SEL COM CAROTID INNOMINATE BILAT MOD SED  Result Date: 09/19/2019 INDICATION: 54 year old male with past medical history significant for hypertension, 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 He presented with acute onset of posterior headache, dizziness and vomiting on 09/17/2019. 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 Given recurrence of hemorrhage in the same location, further investigation with a diagnostic cerebral angiogram was recommended. EXAM: Diagnostic cerebral angiogram COMPARISON:  CT angiogram of the head July 01, 2016 MEDICATIONS: No antibiotics given. ANESTHESIA/SEDATION: Versed 0.5 mg IV; Fentanyl 50 mcg IV Moderate Sedation Time:  60 The patient was continuously monitored during the procedure by the interventional radiology nurse under my direct supervision. FLUOROSCOPY TIME:  Fluoroscopy Time: 14 minutes 30 seconds (1656 mGy). COMPLICATIONS: None immediate. TECHNIQUE: Informed written consent was obtained from the patient after a thorough discussion of the procedural risks, benefits and alternatives. All questions were addressed. Maximal Sterile Barrier Technique was utilized including caps, mask, sterile gowns, sterile gloves, sterile drape, hand hygiene and skin antiseptic. A timeout was performed prior to the initiation of the  procedure. Using the modified Seldinger technique and a micropuncture kit, access was gained to the right radial artery at the wrist and a 5 French sheath was placed. Slow intra arterial infusion of 5,000 IU heparin, 5 mg Verapamil and 425 mcg nitroglicerin diluted in patient's own blood was performed. No significant fluctuation in patient's blood pressure seen. Then, a right radial artery angiogram was obtained via sheath side port. Normal brachial artery branching pattern seen. No significant anatomical variation. The right radial artery caliber is adequate for vascular access. Then, a 5 [place] Simmons 2 glide catheter was navigated over a 0.035 inch Terumo Glidewire into the right subclavian artery. A right subclavian artery roadmap was obtained. The catheter was then advanced into the V2 segment of the right vertebral artery. Townes, lateral and bilateral magnified oblique views of the head were obtained centered on the posterior fossa. The catheter was then placed into the aortic arch and then advanced into the left common carotid artery. Left anterior oblique view of the neck was obtained followed by frontal, lateral and bilateral oblique views of the head. The catheter was then placed into the left subclavian artery. Frontal and lateral angiograms of the head were obtained followed by frontal the angiogram of the neck. The catheter was subsequently placed in the right common carotid artery. Right anterior oblique view of the neck was obtained. Following, frontal, lateral and bilateral oblique views of the head were obtained. The catheter was subsequently withdrawn. The right radial artery sheath was removed and inflatable band was placed over the access site  for patent hemostasis. FINDINGS: Right radial artery ultrasound and right radial artery angiogram: The caliber of the distal right radial artery is appropriate for angiogram access. The right radial artery and the right ulnar artery have normal course and  caliber. No significant anatomical variants noted. Right vertebral artery angiograms: The right vertebral artery, basilar artery, and bilateral posterior cerebral arteries are unremarkable. 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 No abnormally high-flow, early draining veins are seen. No regions of abnormal hypervascularity are noted. The visualized dural sinuses are patent. Left CCA angiograms: Cervical angiograms show normal course and caliber of the visualized left common carotid and internal carotid arteries. There are no significant stenoses. There is brisk vascular contrast filling of the the ACA and MCA vascular trees. Luminal caliber is smooth and tapering. No aneurysms or abnormally high-flow, early draining veins are seen. No regions of abnormal hypervascularity are noted. The intracranial branches of the left external carotid artery are unremarkable. The visualized dural sinuses are patent. Left subclavian and thyrocervical angiograms: The visualized subclavian artery, proximal left vertebral artery and visualized branches of the thyrocervical trunk are unremarkable. Suboptimal opacification of the intracranial vasculature obtained. The left vertebral artery is codominant and of normal caliber. Right CCA angiograms: Cervical angiograms show normal course and caliber of the visualized right common carotid and internal carotid arteries. There are no significant stenoses. There is brisk vascular contrast filling of the ACA and MCA vascular trees. Luminal caliber is smooth and tapering. No aneurysms or abnormally high-flow, early draining veins are seen. No regions of abnormal hypervascularity are noted. The intracranial branches of the right external carotid artery are unremarkable. The visualized dural sinuses are patent. PROCEDURE: Not applicable. 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 PLAN: Angiogram findings discussed with the patient and his wife as well as with Dr. Erlinda Hong. Planned for angiogram with aneurysm embolization tomorrow. Electronically Signed   By: Pedro Earls M.D.   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 He presented with acute onset of posterior headache, dizziness and vomiting on 09/17/2019. 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 Given recurrence of hemorrhage in the same location, further investigation with a diagnostic cerebral angiogram was recommended. EXAM: Diagnostic cerebral angiogram COMPARISON:  CT angiogram of the head July 01, 2016 MEDICATIONS: No antibiotics given. ANESTHESIA/SEDATION: Versed 0.5 mg IV; Fentanyl 50 mcg IV Moderate Sedation Time:  60 The patient was continuously monitored during the procedure by the interventional radiology nurse under my direct supervision. FLUOROSCOPY TIME:  Fluoroscopy Time: 14 minutes 30 seconds (1656 mGy). COMPLICATIONS: None immediate. TECHNIQUE: Informed written consent was obtained from the patient after a thorough discussion of the procedural risks, benefits and alternatives. All questions were addressed. Maximal Sterile Barrier Technique was utilized including caps, mask, sterile gowns, sterile gloves, sterile drape, hand hygiene and skin antiseptic. A timeout was performed prior to the initiation of the procedure. Using the modified Seldinger technique and a micropuncture kit, access was gained to the right radial artery at the wrist and a 5 French sheath was placed. Slow intra arterial infusion  of 5,000 IU heparin, 5  mg Verapamil and 235 mcg nitroglicerin diluted in patient's own blood was performed. No significant fluctuation in patient's blood pressure seen. Then, a right radial artery angiogram was obtained via sheath side port. Normal brachial artery branching pattern seen. No significant anatomical variation. The right radial artery caliber is adequate for vascular access. Then, a 5 [place] Simmons 2 glide catheter was navigated over a 0.035 inch Terumo Glidewire into the right subclavian artery. A right subclavian artery roadmap was obtained. The catheter was then advanced into the V2 segment of the right vertebral artery. Townes, lateral and bilateral magnified oblique views of the head were obtained centered on the posterior fossa. The catheter was then placed into the aortic arch and then advanced into the left common carotid artery. Left anterior oblique view of the neck was obtained followed by frontal, lateral and bilateral oblique views of the head. The catheter was then placed into the left subclavian artery. Frontal and lateral angiograms of the head were obtained followed by frontal the angiogram of the neck. The catheter was subsequently placed in the right common carotid artery. Right anterior oblique view of the neck was obtained. Following, frontal, lateral and bilateral oblique views of the head were obtained. The catheter was subsequently withdrawn. The right radial artery sheath was removed and inflatable band was placed over the access site for patent hemostasis. FINDINGS: Right radial artery ultrasound and right radial artery angiogram: The caliber of the distal right radial artery is appropriate for angiogram access. The right radial artery and the right ulnar artery have normal course and caliber. No significant anatomical variants noted. Right vertebral artery angiograms: The right vertebral artery, basilar artery, and bilateral posterior cerebral arteries are unremarkable. A  dissecting aneurysm of the right posterior inferior cerebellar artery measuring approximately 5.4 x 2.8 mm at the telovelotonsillar segment. No abnormally high-flow, early draining veins are seen. No regions of abnormal hypervascularity are noted. The visualized dural sinuses are patent. Left CCA angiograms: Cervical angiograms show normal course and caliber of the visualized left common carotid and internal carotid arteries. There are no significant stenoses. There is brisk vascular contrast filling of the the ACA and MCA vascular trees. Luminal caliber is smooth and tapering. No aneurysms or abnormally high-flow, early draining veins are seen. No regions of abnormal hypervascularity are noted. The intracranial branches of the left external carotid artery are unremarkable. The visualized dural sinuses are patent. Left subclavian and thyrocervical angiograms: The visualized subclavian artery, proximal left vertebral artery and visualized branches of the thyrocervical trunk are unremarkable. Suboptimal opacification of the intracranial vasculature obtained. The left vertebral artery is codominant and of normal caliber. Right CCA angiograms: Cervical angiograms show normal course and caliber of the visualized right common carotid and internal carotid arteries. There are no significant stenoses. There is brisk vascular contrast filling of the ACA and MCA vascular trees. Luminal caliber is smooth and tapering. No aneurysms or abnormally high-flow, early draining veins are seen. No regions of abnormal hypervascularity are noted. The intracranial branches of the right external carotid artery are unremarkable. The visualized dural sinuses are patent. PROCEDURE: Not applicable. 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 PLAN: Angiogram findings discussed with the patient and his wife as well as  with Dr. Erlinda Hong. Planned for angiogram with aneurysm embolization tomorrow. Electronically Signed   By: Pedro Earls M.D.   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 He presented with acute onset of posterior headache, dizziness and vomiting on 09/17/2019. 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 EXAM: DIAGNOSTIC CEREBRAL ANGIOGRAM AND ENDOVASCULAR EMBOLIZATION COMPARISON:  Diagnostic cerebral angiogram 09/18/2019 MEDICATIONS: No antibiotic administered. ANESTHESIA/SEDATION: The procedure was performed under general anesthesia. FLUOROSCOPY TIME:  Fluoroscopy Time: 74 minutes 42 seconds (5436 mGy). COMPLICATIONS: SIR LEVEL B - Normal therapy, includes overnight admission for observation. TECHNIQUE: Informed written consent was obtained from the patient after a thorough discussion of the procedural risks, benefits and alternatives. All questions were addressed. Maximal Sterile Barrier Technique was utilized including caps, mask, sterile gowns, sterile gloves, sterile drape, hand hygiene and skin antiseptic. A timeout was performed prior to the initiation of the procedure. Real-time ultrasound guidance was utilized for vascular access including the acquisition of a permanent ultrasound image documenting patency of the accessed vessel. Using the modified Seldinger technique and a micropuncture kit, access was gained to the right radial artery at the wrist and a 6 French sheath was placed. Slow intra arterial infusion of 5,000 IU heparin, 5 mg Verapamil and 200 mcg nitroglycerin diluted in patient's own  blood was performed. No significant fluctuation in patient's blood pressure seen. Then, a right radial artery angiogram was obtained via sheath side port. Normal brachial artery branching pattern seen. No significant anatomical variation. The right radial artery caliber is adequate for vascular access. Then, a 5 [place] Simmons 2 glide catheter was navigated over a 0.035 inch Terumo Glidewire into the right subclavian artery. A right subclavian artery roadmap was obtained. The catheter was then advanced into the V2 segment of the right vertebral artery. Townes and lateral views of the head were obtained centered on the posterior fossa. 3D rotational angiograms were acquired and post processed in a separate workstation under concurrent attending physician supervision. Selected images were sent to PACS. FINDINGS: Again seen is a 5 mm dissecting aneurysm as seen on prior angiogram. However, it appears that this aneurysm originates from a dissected branch off of the telovelotonsillar segment of the right posterior inferior cerebellar artery (PICA). The right vertebral artery, basilar artery, and bilateral posterior cerebral arteries are unremarkable. No abnormally high-flow, early draining veins are seen. No regions of abnormal hypervascularity are noted. The visualized dural sinuses are patent. PROCEDURE: Under biplane roadmap, the Simmons 2 catheter was removed over the wire. Intra arterial infusion of 5000 units of heparin, 200 mcg of nitroglycerin and 5 mg of verapamil was performed to the right radial artery via sheath side port. Then, a benchmark guide catheter was advanced over the wire and under fluoroscopic guidance into the distal V2 segment of the right vertebral artery. Magnified frontal and lateral views of the head were obtained in the working projections. Using biplane roadmap, a headway duo microcatheter was coaxially advanced over a synchro 2 micro guidewire into the right vertebral artery and then  navigated into the right posteroinferior cerebellar artery. Frontal and lateral angiograms were obtained with microcatheter contrast injection. The microcatheter was advanced near the origin of the target vessel. Attempted placement of a 2 mm x 4 cm Target 360 Nano coil proved unsuccessful. Follow-up right vertebral artery angiograms with magnified frontal and lateral views of the  head were projections obtained. The microcatheter was then advanced and wedged at the origin of the right targeted PICA branch. The microcatheter was then prepped with DMSO. Following, slow injection of onyx 18 was performed under fluoroscopy. Embolization of the target vessel achieved. Small reflux into the PICA noted. The catheter was removed under constant aspiration. Follow-up angiogram showed occlusion of PICA at the telovelotonsillar segment. Right vertebral artery angiograms with frontal and lateral views of the entire head to evidence of distal thromboembolic complication it. Flat panel CT of the head was obtained and post processed in a separate workstation with concurrent attending physician supervision. Selected images were sent to PACS. No evidence of hemorrhagic complication noted. The catheter was subsequently withdrawn. The right radial sheath was removed and inflatable band was placed over the access site for patent hemostasis. 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 PLAN: 1. Continued observation in ICU. 2. Stat head CT for any neurological deterioration. Patient and spouse communicated in person. Electronically Signed   By: Pedro Earls M.D.   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 He presented with acute onset of posterior headache, dizziness and vomiting on 09/17/2019. 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 Given recurrence of hemorrhage in the same location, further investigation with a diagnostic cerebral angiogram was recommended. EXAM: Diagnostic cerebral angiogram COMPARISON:  CT angiogram of the head July 01, 2016 MEDICATIONS: No antibiotics given. ANESTHESIA/SEDATION: Versed 0.5 mg IV; Fentanyl 50 mcg IV Moderate Sedation Time:  60 The patient was continuously monitored during the procedure by the interventional radiology nurse under my direct supervision. FLUOROSCOPY TIME:  Fluoroscopy Time: 14 minutes 30 seconds (1656 mGy). COMPLICATIONS: None immediate. TECHNIQUE: Informed written consent was obtained from the patient after a thorough discussion of the procedural risks, benefits and alternatives. All questions were addressed. Maximal Sterile Barrier Technique was utilized including caps, mask, sterile gowns, sterile gloves, sterile drape, hand hygiene and skin antiseptic. A timeout was performed prior to the initiation of the procedure. Using the modified Seldinger technique and a micropuncture kit, access was gained to the right radial artery at the wrist and a 5 French sheath was placed. Slow intra arterial infusion of 5,000 IU heparin, 5 mg Verapamil and 329 mcg nitroglicerin diluted in patient's own blood was performed. No significant fluctuation in patient's blood pressure seen. Then, a right radial artery angiogram was obtained via sheath side port. Normal brachial artery branching pattern seen. No significant anatomical variation. The right radial artery caliber is adequate for vascular access. Then, a 5 [place] Simmons 2 glide catheter was navigated over a 0.035 inch Terumo Glidewire into the right subclavian artery. A right subclavian artery roadmap was obtained. The catheter was then advanced into the V2 segment of the right  vertebral artery. Townes, lateral and bilateral magnified oblique views of the head were obtained centered on the posterior fossa. The catheter was then placed into the aortic arch and then advanced into the left common carotid artery. Left anterior oblique view of the neck was obtained followed by frontal, lateral and bilateral oblique views of the head. The catheter was then placed into the left subclavian artery. Frontal and lateral angiograms of the head were obtained followed by frontal  the angiogram of the neck. The catheter was subsequently placed in the right common carotid artery. Right anterior oblique view of the neck was obtained. Following, frontal, lateral and bilateral oblique views of the head were obtained. The catheter was subsequently withdrawn. The right radial artery sheath was removed and inflatable band was placed over the access site for patent hemostasis. FINDINGS: Right radial artery ultrasound and right radial artery angiogram: The caliber of the distal right radial artery is appropriate for angiogram access. The right radial artery and the right ulnar artery have normal course and caliber. No significant anatomical variants noted. Right vertebral artery angiograms: The right vertebral artery, basilar artery, and bilateral posterior cerebral arteries are unremarkable. 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 No abnormally high-flow, early draining veins are seen. No regions of abnormal hypervascularity are noted. The visualized dural sinuses are patent. Left CCA angiograms: Cervical angiograms show normal course and caliber of the visualized left common carotid and internal carotid arteries. There are no significant stenoses. There is brisk vascular contrast filling of the the ACA and MCA vascular trees. Luminal caliber is smooth and tapering. No aneurysms or abnormally high-flow, early  draining veins are seen. No regions of abnormal hypervascularity are noted. The intracranial branches of the left external carotid artery are unremarkable. The visualized dural sinuses are patent. Left subclavian and thyrocervical angiograms: The visualized subclavian artery, proximal left vertebral artery and visualized branches of the thyrocervical trunk are unremarkable. Suboptimal opacification of the intracranial vasculature obtained. The left vertebral artery is codominant and of normal caliber. Right CCA angiograms: Cervical angiograms show normal course and caliber of the visualized right common carotid and internal carotid arteries. There are no significant stenoses. There is brisk vascular contrast filling of the ACA and MCA vascular trees. Luminal caliber is smooth and tapering. No aneurysms or abnormally high-flow, early draining veins are seen. No regions of abnormal hypervascularity are noted. The intracranial branches of the right external carotid artery are unremarkable. The visualized dural sinuses are patent. PROCEDURE: Not applicable. 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 PLAN: Angiogram findings discussed with the patient and his wife as well as with Dr. Erlinda Hong. Planned for angiogram with aneurysm embolization tomorrow. Electronically Signed   By: Pedro Earls M.D.   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 He presented with acute onset of posterior headache, dizziness and vomiting on 09/17/2019. 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 Diagnostic cerebral angiogram  performed on 09/18/2019 showed a dissected right PICA aneurysm, most likely the source of bleed. EXAM: DIAGNOSTIC CEREBRAL ANGIOGRAM AND ENDOVASCULAR EMBOLIZATION COMPARISON:  Diagnostic cerebral angiogram 09/18/2019 MEDICATIONS: No antibiotic administered. ANESTHESIA/SEDATION: The procedure was performed under general anesthesia. FLUOROSCOPY TIME:  Fluoroscopy Time: 74 minutes 42 seconds (5436 mGy). COMPLICATIONS: SIR LEVEL B - Normal therapy, includes overnight admission for observation. TECHNIQUE: Informed written consent was obtained from the patient after a thorough discussion of the procedural risks, benefits and alternatives. All questions were addressed. Maximal Sterile Barrier Technique was utilized including caps, mask, sterile gowns, sterile gloves, sterile drape, hand hygiene and skin antiseptic. A timeout was performed prior  to the initiation of the procedure. Real-time ultrasound guidance was utilized for vascular access including the acquisition of a permanent ultrasound image documenting patency of the accessed vessel. Using the modified Seldinger technique and a micropuncture kit, access was gained to the right radial artery at the wrist and a 6 French sheath was placed. Slow intra arterial infusion of 5,000 IU heparin, 5 mg Verapamil and 200 mcg nitroglycerin diluted in patient's own blood was performed. No significant fluctuation in patient's blood pressure seen. Then, a right radial artery angiogram was obtained via sheath side port. Normal brachial artery branching pattern seen. No significant anatomical variation. The right radial artery caliber is adequate for vascular access. Then, a 5 [place] Simmons 2 glide catheter was navigated over a 0.035 inch Terumo Glidewire into the right subclavian artery. A right subclavian artery roadmap was obtained. The catheter was then advanced into the V2 segment of the right vertebral artery. Townes and lateral views of the head were obtained centered on the  posterior fossa. 3D rotational angiograms were acquired and post processed in a separate workstation under concurrent attending physician supervision. Selected images were sent to PACS. FINDINGS: Again seen is a 5 mm dissecting aneurysm as seen on prior angiogram. However, it appears that this aneurysm originates from a dissected branch off of the telovelotonsillar segment of the right posterior inferior cerebellar artery (PICA). The right vertebral artery, basilar artery, and bilateral posterior cerebral arteries are unremarkable. No abnormally high-flow, early draining veins are seen. No regions of abnormal hypervascularity are noted. The visualized dural sinuses are patent. PROCEDURE: Under biplane roadmap, the Simmons 2 catheter was removed over the wire. Intra arterial infusion of 5000 units of heparin, 200 mcg of nitroglycerin and 5 mg of verapamil was performed to the right radial artery via sheath side port. Then, a benchmark guide catheter was advanced over the wire and under fluoroscopic guidance into the distal V2 segment of the right vertebral artery. Magnified frontal and lateral views of the head were obtained in the working projections. Using biplane roadmap, a headway duo microcatheter was coaxially advanced over a synchro 2 micro guidewire into the right vertebral artery and then navigated into the right posteroinferior cerebellar artery. Frontal and lateral angiograms were obtained with microcatheter contrast injection. The microcatheter was advanced near the origin of the target vessel. Attempted placement of a 2 mm x 4 cm Target 360 Nano coil proved unsuccessful. Follow-up right vertebral artery angiograms with magnified frontal and lateral views of the head were projections obtained. The microcatheter was then advanced and wedged at the origin of the right targeted PICA branch. The microcatheter was then prepped with DMSO. Following, slow injection of onyx 18 was performed under fluoroscopy.  Embolization of the target vessel achieved. Small reflux into the PICA noted. The catheter was removed under constant aspiration. Follow-up angiogram showed occlusion of PICA at the telovelotonsillar segment. Right vertebral artery angiograms with frontal and lateral views of the entire head to evidence of distal thromboembolic complication it. Flat panel CT of the head was obtained and post processed in a separate workstation with concurrent attending physician supervision. Selected images were sent to PACS. No evidence of hemorrhagic complication noted. The catheter was subsequently withdrawn. The right radial sheath was removed and inflatable band was placed over the access site for patent hemostasis. 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 PLAN: 1. Continued observation in ICU. 2. Stat head CT for any neurological deterioration.  Patient and spouse communicated in person. Electronically Signed   By: Pedro Earls M.D.   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 Karenann Cai. Patient's condition stable- neurologically intact,  asymptomatic at this time. Right radial puncture site stable, radial pulses 2+ bilaterally. Continue taking Aspirin 325 mg once daily. Further plans per neurology- appreciate and agree with management. NIR to follow.   Electronically Signed: Earley Abide, PA-C 09/22/2019, 8:46 AM   I spent a total of 25 Minutes at the the patient's bedside AND on the patient's hospital floor or unit, greater than 50% of which was counseling/coordinating care for dissecting right PICA aneurysm s/p embolization.

## 2019-09-22 NOTE — Progress Notes (Signed)
Inpatient Rehab Admissions Coordinator:  Spoke with pt's wife, Seth Bake.  Explained CIR goals and expectations. She acknowledged understanding of goals and expectations. However, she wanted to discuss with pt before agreeing to potential CIR admission.  Stated she would call [facility] after discussed with pt and they made a decision regarding therapy setting after d/c from acute hospital.  Gayland Curry, [place], [place] Admissions Coordinator [phone]

## 2019-09-23 ENCOUNTER — Encounter (HOSPITAL_COMMUNITY): Payer: Self-pay | Admitting: Student in an Organized Health Care Education/Training Program

## 2019-09-23 DIAGNOSIS — I615 Nontraumatic intracerebral hemorrhage, intraventricular: Principal | ICD-10-CM

## 2019-09-23 LAB — BASIC METABOLIC PANEL
Anion gap: 10 (ref 5–15)
BUN: 17 mg/dL (ref 6–20)
CO2: 25 mmol/L (ref 22–32)
Calcium: 8.8 mg/dL — ABNORMAL LOW (ref 8.9–10.3)
Chloride: 96 mmol/L — ABNORMAL LOW (ref 98–111)
Creatinine, Ser: 0.8 mg/dL (ref 0.61–1.24)
GFR calc Af Amer: >60 mL/min (ref >60)
GFR calc non Af Amer: >60 mL/min (ref >60)
Glucose, Bld: 115 mg/dL — ABNORMAL HIGH (ref 70–99)
Potassium: 4.1 mmol/L (ref 3.5–5.1)
Sodium: 131 mmol/L — ABNORMAL LOW (ref 135–145)

## 2019-09-23 LAB — CBC
HCT: 35.6 % — ABNORMAL LOW (ref 39.0–52.0)
Hemoglobin: 12.2 g/dL — ABNORMAL LOW (ref 13.0–17.0)
MCH: 29.4 pg (ref 26.0–34.0)
MCHC: 34.3 g/dL (ref 30.0–36.0)
MCV: 85.8 fL (ref 80.0–100.0)
Platelets: 225 10*3/uL (ref 150–400)
RBC: 4.15 MIL/uL — ABNORMAL LOW (ref 4.22–5.81)
RDW: 14.8 % (ref 11.5–15.5)
WBC: 11.5 10*3/uL — ABNORMAL HIGH (ref 4.0–10.5)
nRBC: 0 % (ref 0.0–0.2)

## 2019-09-23 MED ORDER — HYDRALAZINE HCL 20 MG/ML IJ SOLN
20.0000 mg | Freq: Four times a day (QID) | INTRAMUSCULAR | Status: DC | PRN
  Administered 2019-09-23 – 2019-09-25 (×4): 20 mg via INTRAVENOUS
  Filled 2019-09-23 (×4): qty 1

## 2019-09-23 NOTE — Progress Notes (Signed)
STROKE TEAM PROGRESS NOTE   INTERVAL HISTORY  Patient is sitting up in bedside chair.  Blood pressure is still requiring IV Cleviprex drip for control but it is being weaned.Andrew Perez  He denies any headache.  Vital signs otherwise stable.  No new complaints.  Physical therapist recommended inpatient rehab.  He has increasing generalized body edema and lower extremity strength seems weaker today as per the therapists.  Vitals:   09/23/19 1130 09/23/19 1200 09/23/19 1300 09/23/19 1400  BP: (!) 141/75 134/73 (!) 142/74 140/81  Pulse: (!) 56 (!) 56 (!) 55 (!) 52  Resp: 14 14 13 19   Temp:  (!) 97.5 F (36.4 C)    TempSrc:      SpO2: 98% 97% 100% 100%  Weight:      Height:       CBC:  Recent Labs  Lab 09/22/19 0355 09/23/19 1251  WBC 13.5* 11.5*  HGB 13.3 12.2*  HCT 39.3 35.6*  MCV 87.7 85.8  PLT 268 629   Basic Metabolic Panel:  Recent Labs  Lab 09/22/19 0355 09/23/19 1251  NA 133* 131*  K 4.3 4.1  CL 98 96*  CO2 23 25  GLUCOSE 128* 115*  BUN 15 17  CREATININE 0.84 0.80  CALCIUM 9.0 8.8*   Lipid Panel:     Component Value Date/Time   CHOL 159 09/19/2019 0515   TRIG 100 09/19/2019 0515   HDL 35 (L) 09/19/2019 0515   CHOLHDL 4.5 09/19/2019 0515   VLDL 20 09/19/2019 0515   LDLCALC 104 (H) 09/19/2019 0515   HgbA1c:  Lab Results  Component Value Date   HGBA1C 4.7 (L) 09/18/2019   Urine Drug Screen:     Component Value Date/Time   LABOPIA POSITIVE (A) 09/17/2019 2126   COCAINSCRNUR NONE DETECTED 09/17/2019 2126   LABBENZ NONE DETECTED 09/17/2019 2126   AMPHETMU NONE DETECTED 09/17/2019 2126   THCU NONE DETECTED 09/17/2019 2126   LABBARB NONE DETECTED 09/17/2019 2126    Alcohol Level No results found for: ETH  IMAGING past 24 hours  CT HEAD WO CONTRAST 09/20/2019 IMPRESSION:  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 No hydrocephalus or interval hemorrhage.   MR BRAIN WO CONTRAST 09/20/2019 IMPRESSION:  Evolving bilateral PICA territory infarct,  similar in extension to prior CT. There is no hydrocephalus.    PHYSICAL EXAM    Temp:  [97.5 F (36.4 C)-99.7 F (37.6 C)] 97.5 F (36.4 C) (06/07 1200) Pulse Rate:  [37-70] 52 (06/07 1400) Resp:  [10-21] 19 (06/07 1400) BP: (124-185)/(57-82) 140/81 (06/07 1400) SpO2:  [83 %-100 %] 100 % (06/07 1400)  General -morbidly obese middle-aged Caucasian male well developed, not in acute distress.  Ophthalmologic - fundi not visualized due to noncooperation.  Cardiovascular - Regular rhythm and rate.  Neuro - awake alert and oriented to time place and person.. No aphasia, but paucity of speech, no dysarthria. Able to name and repeat. PERRL, EOMI, no nystagmus on bilateral gaze.  Facial symmetrical. visual field full. Tongue midline. Moving all extremities equally but lower extremities less than upper extremities. B/l FTN intact no ataxia. Sensation intact, gait not tested.    ASSESSMENT/PLAN [name] is a 54 y.o. 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 SBP > 190.  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 small volume IVH in 4th ventricle. Interval resection previous central skull base infiltrative mass. 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 No hydrocephalus. Known sellar mass w/ skull base erosion.   Cerebral angio right PICA 27mm aneurysm   MRI - Evolving bilateral PICA territory infarct, similar in extension to prior CT. There is no hydrocephalus.   2D Echo EF 55-60%. No source of embolus. LA moderate dilated  LDL  104  HgbA1c 4.7  UDS + opiates  SCDs for VTE prophylaxis  No antithrombotic prior to admission, was on No antithrombotic given hemorrhage. Post aneurysm embolization, now off heparin IV, on ASA 325.   Therapy recommendations:  CIR  Disposition:  Pending  Right PICA aneurysm  Cerebral angiogram showed right PICA 5 mm aneurysm  Aneurysm management with right PICA embolization 6/3  Expecting small right cerebellar infarct  CT bilateral cerebellar infarct, right more than left  MRI confirmed bilateral PICA infarcts, right more than left.  No hydrocephalus  Off heparin IV, on aspirin 325  History of IVH  2018 - admitted for headache, MRI and CT showed IVH involving the 4th ventricle, and old right BG infarct.  Carotid Doppler negative.  EF 55 to 60%.  LDL 27 and A1c 4.6.  CTA head at that time showed right PICA patent.  Prolactinoma    Followed by Dr. Maurie Boettcher and endocrinologist at [facility]  On cabergoline therapy since March 2018 with decrease in mass size on follow-up imaging  Being followed by surveillance with MRIs  Cabergoline every Wed and Sat  Hypertensive Emergency  SBP > 190 on arrival   Home meds:  amlodipine 10, hydralazine 100 q8, losartan 100  Now still on cleviprex   Resumed home medication  Add HCTZ 50 and clonidine 0.1 tid -> 0.2 tid . SBP goal <160 (SBP 141 - 160 today) . Long-term BP goal normotensive  Hyperlipidemia  LDL 104  on no statin PTA  Add lipitor 40 in the setting of stroke  Continue statin on discharge.  Other Stroke Risk Factors  Morbid Obesity, Body mass index is 58.36 kg/m., recommend weight loss, diet and exercise as appropriate   Obstructive sleep apnea  Other Active Problems  Hypothyroid on synthroid  Leukocytosis - 14.6->14.4 (afebrile)->13.5  Mild hyponatremia - 133->133   Hospital day # 6  Continue to wean IV Cleviprex drip and use as needed IV hydralazine and continue oral blood pressure  medications.  Mobilize out of bed.  Continue ongoing therapies.  Anticipate transfer to neurology floor bed when he is off Cleviprex drip later today..  Rehab consultation pending Long discussion with patient and wife at the bedside and answered questions.  Greater than 50% time during this 35-minute visit was spent on counseling and coordination of care and discussion with patient and wife and answering questions.   Antony Contras, MD Medical Director [place] Pager: [phone] 09/23/2019 3:14 PM  To contact Stroke Continuity provider, please refer to [email or web address] After hours, contact General Neurology

## 2019-09-23 NOTE — Progress Notes (Signed)
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 Karenann Cai  Subjective:  Patient sleeping upon entry to room, easily arouses to voice cues, answers questions appropriately, follows commands. Per RN he has been more sleepy today, LLE noted to be more swollen than RLE and [place] has been ordered. Patient's wife at bedside who reports that his LLE has been more swollen than his right for several years after he had cellulitis. He is planned to go to inpatient rehab.   Allergies: Codeine and Shellfish allergy  Medications: Prior to Admission medications   Medication Sig Start Date End Date Taking? Authorizing Provider  amLODipine (NORVASC) 10 MG tablet Take 1 tablet (10 mg total) by mouth daily. 07/04/16  Yes Rinehuls, Early Chars, PA-C  cabergoline (DOSTINEX) 0.5 MG tablet Take 0.5 mg by mouth 2 (two) times a week. Saturday and wednesday 06/24/16  Yes [provider]  cloNIDine (CATAPRES - DOSED IN MG/24 HR) 0.3 mg/24hr patch Place 0.3 mg onto the skin every 7 (seven) days. Saturday 03/12/18  Yes [provider]  hydrALAZINE (APRESOLINE) 100 MG tablet Take 1 tablet (100 mg total) by mouth every 8 (eight) hours. Patient taking differently: Take 100 mg by mouth 3 (three) times daily.  07/03/16  Yes Rinehuls, Early Chars, PA-C  levothyroxine (SYNTHROID, LEVOTHROID) 100 MCG tablet Take 100 mcg by mouth daily. 02/22/18  Yes [provider]  losartan (COZAAR) 100 MG tablet Take 100 mg by mouth daily. 03/20/18  Yes [provider]  cloNIDine (CATAPRES) 0.1 MG tablet One tablet three times daily Patient not taking: Reported on 04/13/2018 07/03/16   Rinehuls, Early Chars, PA-C  lisinopril  (PRINIVIL,ZESTRIL) 20 MG tablet Take 1 tablet (20 mg total) by mouth 2 (two) times daily. Patient not taking: Reported on 04/13/2018 07/03/16   Rinehuls, Early Chars, PA-C     Vital Signs: BP (!) 145/75   Pulse 61   Temp 99.7 F (37.6 C) (Oral)   Resp 10   Ht 5' 11"  (1.803 m)   Wt (!) 418 lb 6.9 oz (189.8 kg)   SpO2 (!) 83%   BMI 58.36 kg/m   Physical Exam Vitals and nursing note reviewed.  Constitutional:      General: He is not in acute distress.    Appearance: He is obese.     Comments: Somnolent, arouses easily to voice cues. RN and wife at bedside.  HENT:     Head: Normocephalic.  Cardiovascular:     Rate and Rhythm: Normal rate.  Pulmonary:     Effort: Pulmonary effort is normal.  Musculoskeletal:     Right lower leg: Edema present.     Left lower leg: Edema (L>R, non tender) present.  Skin:    General: Skin is warm and dry.    Speech and comprehension in tact EOMs without nystagmus or subjective diplopia. Visual fields grossly in tact No obvious facial asymmetry. Tongue midline Motor power 4/5 LLE, 5/5 LUE/RLE/RUE Sensation in tact   Imaging: CT HEAD WO CONTRAST  Result Date: 09/20/2019 CLINICAL DATA:  Stroke follow-up EXAM: CT HEAD WITHOUT CONTRAST TECHNIQUE: Contiguous axial images were obtained from the base of the skull through the vertex without intravenous contrast. COMPARISON:  Two days ago FINDINGS: Brain: Right  more than left inferior cerebellar infarction. Onyx is seen at the level of vessel embolization. Narrowing of the lower fourth ventricle without hydrocephalus. There is small volume intraventricular hemorrhage which is not progressed. Vascular: As above Skull: No acute or aggressive finding. Sinuses/Orbits: Right mastoid and middle ear opacification. 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 No hydrocephalus or interval hemorrhage. Electronically Signed   By: Monte Fantasia M.D.    On: 09/20/2019 07:13   MR BRAIN WO CONTRAST  Result Date: 09/20/2019 CLINICAL DATA:  Stroke follow-up. EXAM: MRI HEAD WITHOUT CONTRAST TECHNIQUE: Multiplanar, multiecho pulse sequences of the brain and surrounding structures were obtained without intravenous contrast. COMPARISON:  Head CT performed on September 20, 2019 at 6:48 a.m. MRI of the brain June 28, 2016. FINDINGS: Brain: Area of restricted diffusion is seen involving predominantly the right PICA territory with extension to the medial aspect of the left inferior left cerebellar hemisphere, in a similar extension as compared to recent head CT. No brainstem involvement. No hydrocephalus. Susceptibility artifact within the fourth ventricles and in the dependent portion of the bilateral occipital horns represent known intraventricular hemorrhage. More linear appearing susceptibility artifact in the fourth ventricle/inferior aspect of the cerebellar hemisphere corresponds to liquid embolic cast. Mild increase FLAIR signal within the cerebral sulci may reflect minimal residual subarachnoid hemorrhage from blood redistribution. Unchanged small remote right putaminal infarct and few scattered white matter foci of T2 hyperintensity. Enlarged sella with erosion of the clivus with predominantly T2 hyperintense solid cystic mass lesion and anterior displacement of the right carotid siphon which is encased by the mass, without stenosis. Compared to prior study, the lesion appear to have increased T2 signal with increased cystic component. Vascular: Normal flow voids, noting that the posteroinferior cerebellar artery is too small to be accurately evaluated with this technique. Skull and upper cervical spine: Normal marrow signal. Sinuses/Orbits: Mucous retention cyst in the left maxillary sinus. Mild mucosal thickening of the ethmoid cells. Other: Small amount of fluid in the right mastoid cells. Decreased diameter of the nasopharynx which may be associated with  obstructive sleep apnea. IMPRESSION: Evolving bilateral PICA territory infarct, similar in extension to prior CT. There is no hydrocephalus. These results were called by telephone at the time of interpretation on 09/20/2019 at 1:38 pm to provider [facility] , who verbally acknowledged these results. Electronically Signed   By: Pedro Earls M.D.   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 He presented with acute onset of posterior headache, dizziness and vomiting on 09/17/2019. 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 EXAM: DIAGNOSTIC CEREBRAL ANGIOGRAM AND ENDOVASCULAR EMBOLIZATION COMPARISON:  Diagnostic cerebral angiogram 09/18/2019 MEDICATIONS: No antibiotic administered. ANESTHESIA/SEDATION: The procedure was performed under general anesthesia. FLUOROSCOPY TIME:  Fluoroscopy Time: 74 minutes 42 seconds (5436 mGy). COMPLICATIONS: SIR LEVEL B - Normal therapy, includes overnight admission for observation. TECHNIQUE: Informed written consent was obtained from the patient after a thorough discussion of the procedural risks, benefits and alternatives. All questions were addressed. Maximal Sterile Barrier Technique was utilized including caps, mask, sterile gowns, sterile gloves, sterile drape, hand hygiene and skin antiseptic. A timeout was performed prior to the initiation of the procedure.  Real-time ultrasound guidance was utilized for vascular access including the acquisition of a permanent ultrasound image documenting patency of the accessed vessel. Using the modified Seldinger technique and a micropuncture kit, access was  gained to the right radial artery at the wrist and a 6 French sheath was placed. Slow intra arterial infusion of 5,000 IU heparin, 5 mg Verapamil and 200 mcg nitroglycerin diluted in patient's own blood was performed. No significant fluctuation in patient's blood pressure seen. Then, a right radial artery angiogram was obtained via sheath side port. Normal brachial artery branching pattern seen. No significant anatomical variation. The right radial artery caliber is adequate for vascular access. Then, a 5 [place] Simmons 2 glide catheter was navigated over a 0.035 inch Terumo Glidewire into the right subclavian artery. A right subclavian artery roadmap was obtained. The catheter was then advanced into the V2 segment of the right vertebral artery. Townes and lateral views of the head were obtained centered on the posterior fossa. 3D rotational angiograms were acquired and post processed in a separate workstation under concurrent attending physician supervision. Selected images were sent to PACS. FINDINGS: Again seen is a 5 mm dissecting aneurysm as seen on prior angiogram. However, it appears that this aneurysm originates from a dissected branch off of the telovelotonsillar segment of the right posterior inferior cerebellar artery (PICA). The right vertebral artery, basilar artery, and bilateral posterior cerebral arteries are unremarkable. No abnormally high-flow, early draining veins are seen. No regions of abnormal hypervascularity are noted. The visualized dural sinuses are patent. PROCEDURE: Under biplane roadmap, the Simmons 2 catheter was removed over the wire. Intra arterial infusion of 5000 units of heparin, 200 mcg of nitroglycerin and 5 mg of verapamil was performed to the right radial artery via sheath side port. Then, a benchmark guide catheter was advanced over the wire and under fluoroscopic guidance into the distal V2 segment of the right vertebral artery. Magnified frontal and lateral views of the  head were obtained in the working projections. Using biplane roadmap, a headway duo microcatheter was coaxially advanced over a synchro 2 micro guidewire into the right vertebral artery and then navigated into the right posteroinferior cerebellar artery. Frontal and lateral angiograms were obtained with microcatheter contrast injection. The microcatheter was advanced near the origin of the target vessel. Attempted placement of a 2 mm x 4 cm Target 360 Nano coil proved unsuccessful. Follow-up right vertebral artery angiograms with magnified frontal and lateral views of the head were projections obtained. The microcatheter was then advanced and wedged at the origin of the right targeted PICA branch. The microcatheter was then prepped with DMSO. Following, slow injection of onyx 18 was performed under fluoroscopy. Embolization of the target vessel achieved. Small reflux into the PICA noted. The catheter was removed under constant aspiration. Follow-up angiogram showed occlusion of PICA at the telovelotonsillar segment. Right vertebral artery angiograms with frontal and lateral views of the entire head to evidence of distal thromboembolic complication it. Flat panel CT of the head was obtained and post processed in a separate workstation with concurrent attending physician supervision. Selected images were sent to PACS. No evidence of hemorrhagic complication noted. The catheter was subsequently withdrawn. The right radial sheath was removed and inflatable band was placed over the access site for patent hemostasis. 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 PLAN: 1. Continued observation in ICU. 2. Stat head CT for any neurological deterioration. Patient and spouse communicated in person.  Electronically Signed   By: Pedro Earls M.D.   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 He presented with acute onset of posterior headache, dizziness and vomiting on 09/17/2019. 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 EXAM: DIAGNOSTIC CEREBRAL ANGIOGRAM AND ENDOVASCULAR EMBOLIZATION COMPARISON:  Diagnostic cerebral angiogram 09/18/2019 MEDICATIONS: No antibiotic administered. ANESTHESIA/SEDATION: The procedure was performed under general anesthesia. FLUOROSCOPY TIME:  Fluoroscopy Time: 74 minutes 42 seconds (5436 mGy). COMPLICATIONS: SIR LEVEL B - Normal therapy, includes overnight admission for observation. TECHNIQUE: Informed written consent was obtained from the patient after a thorough discussion of the procedural risks, benefits and alternatives. All questions were addressed. Maximal Sterile Barrier Technique was utilized including caps, mask, sterile gowns, sterile gloves, sterile drape, hand hygiene and skin antiseptic. A timeout was performed prior to the initiation of the procedure. Real-time ultrasound guidance was utilized for vascular access including the acquisition of a permanent ultrasound image documenting patency of the accessed vessel. Using the modified Seldinger technique and a micropuncture kit, access was gained to the right radial artery at the wrist and a 6 French sheath was placed. Slow intra arterial infusion of 5,000 IU heparin, 5 mg Verapamil and 200 mcg nitroglycerin diluted in patient's own blood was performed. No significant fluctuation in patient's blood pressure seen. Then, a right radial artery angiogram was obtained via sheath side port. Normal brachial artery branching pattern seen. No significant anatomical  variation. The right radial artery caliber is adequate for vascular access. Then, a 5 [place] Simmons 2 glide catheter was navigated over a 0.035 inch Terumo Glidewire into the right subclavian artery. A right subclavian artery roadmap was obtained. The catheter was then advanced into the V2 segment of the right vertebral artery. Townes and lateral views of the head were obtained centered on the posterior fossa. 3D rotational angiograms were acquired and post processed in a separate workstation under concurrent attending physician supervision. Selected images were sent to PACS. FINDINGS: Again seen is a 5 mm dissecting aneurysm as seen on prior angiogram. However, it appears that this aneurysm originates from a dissected branch off of the telovelotonsillar segment of the right posterior inferior cerebellar artery (PICA). The right vertebral artery, basilar artery, and bilateral posterior cerebral arteries are unremarkable. No abnormally high-flow, early draining veins are seen. No regions of abnormal hypervascularity are noted. The visualized dural sinuses are patent. PROCEDURE: Under biplane roadmap, the Simmons 2 catheter was removed over the wire. Intra arterial infusion of 5000 units of heparin, 200 mcg of nitroglycerin and 5 mg of verapamil was performed to the right radial artery via sheath side port. Then, a benchmark guide catheter was advanced over the wire and under fluoroscopic guidance into the distal V2 segment of the right vertebral artery. Magnified frontal and lateral views of the head were obtained in the working projections. Using biplane roadmap, a headway duo microcatheter was coaxially advanced over a synchro 2 micro guidewire into the right vertebral artery and then navigated into the right posteroinferior cerebellar artery. Frontal and lateral angiograms were obtained with microcatheter contrast injection. The microcatheter was advanced near the origin of the target vessel. Attempted placement  of a 2 mm x 4 cm Target 360  Nano coil proved unsuccessful. Follow-up right vertebral artery angiograms with magnified frontal and lateral views of the head were projections obtained. The microcatheter was then advanced and wedged at the origin of the right targeted PICA branch. The microcatheter was then prepped with DMSO. Following, slow injection of onyx 18 was performed under fluoroscopy. Embolization of the target vessel achieved. Small reflux into the PICA noted. The catheter was removed under constant aspiration. Follow-up angiogram showed occlusion of PICA at the telovelotonsillar segment. Right vertebral artery angiograms with frontal and lateral views of the entire head to evidence of distal thromboembolic complication it. Flat panel CT of the head was obtained and post processed in a separate workstation with concurrent attending physician supervision. Selected images were sent to PACS. No evidence of hemorrhagic complication noted. The catheter was subsequently withdrawn. The right radial sheath was removed and inflatable band was placed over the access site for patent hemostasis. 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 PLAN: 1. Continued observation in ICU. 2. Stat head CT for any neurological deterioration. Patient and spouse communicated in person. Electronically Signed   By: Pedro Earls M.D.   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 He presented with acute onset of posterior headache, dizziness and vomiting on 09/17/2019. 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 Diagnostic cerebral angiogram performed on 09/18/2019 showed a dissected right PICA  aneurysm, most likely the source of bleed. EXAM: DIAGNOSTIC CEREBRAL ANGIOGRAM AND ENDOVASCULAR EMBOLIZATION COMPARISON:  Diagnostic cerebral angiogram 09/18/2019 MEDICATIONS: No antibiotic administered. ANESTHESIA/SEDATION: The procedure was performed under general anesthesia. FLUOROSCOPY TIME:  Fluoroscopy Time: 74 minutes 42 seconds (5436 mGy). COMPLICATIONS: SIR LEVEL B - Normal therapy, includes overnight admission for observation. TECHNIQUE: Informed written consent was obtained from the patient after a thorough discussion of the procedural risks, benefits and alternatives. All questions were addressed. Maximal Sterile Barrier Technique was utilized including caps, mask, sterile gowns, sterile gloves, sterile drape, hand hygiene and skin antiseptic. A timeout was performed prior to the initiation of the procedure. Real-time ultrasound guidance was utilized for vascular access including the acquisition of a permanent ultrasound image documenting patency of the accessed vessel. Using the modified Seldinger technique and a micropuncture kit, access was gained to the right radial artery at the wrist and a 6 French sheath was placed. Slow intra arterial infusion of 5,000 IU heparin, 5 mg Verapamil and 200 mcg nitroglycerin diluted in patient's own blood was performed. No significant fluctuation in patient's blood pressure seen. Then, a right radial artery angiogram was obtained via sheath side port. Normal brachial artery branching pattern seen. No significant anatomical variation. The right radial artery caliber is adequate for vascular access. Then, a 5 [place] Simmons 2 glide catheter was navigated over a 0.035 inch Terumo Glidewire into the right subclavian artery. A right subclavian artery roadmap was obtained. The catheter was then advanced into the V2 segment of the right vertebral artery. Townes and lateral views of the head were obtained centered on the posterior fossa. 3D rotational angiograms were  acquired and post processed in a separate workstation under concurrent attending physician supervision. Selected images were sent to PACS. FINDINGS: Again seen is a 5 mm dissecting aneurysm as seen on prior angiogram. However, it appears that this aneurysm  originates from a dissected branch off of the telovelotonsillar segment of the right posterior inferior cerebellar artery (PICA). The right vertebral artery, basilar artery, and bilateral posterior cerebral arteries are unremarkable. No abnormally high-flow, early draining veins are seen. No regions of abnormal hypervascularity are noted. The visualized dural sinuses are patent. PROCEDURE: Under biplane roadmap, the Simmons 2 catheter was removed over the wire. Intra arterial infusion of 5000 units of heparin, 200 mcg of nitroglycerin and 5 mg of verapamil was performed to the right radial artery via sheath side port. Then, a benchmark guide catheter was advanced over the wire and under fluoroscopic guidance into the distal V2 segment of the right vertebral artery. Magnified frontal and lateral views of the head were obtained in the working projections. Using biplane roadmap, a headway duo microcatheter was coaxially advanced over a synchro 2 micro guidewire into the right vertebral artery and then navigated into the right posteroinferior cerebellar artery. Frontal and lateral angiograms were obtained with microcatheter contrast injection. The microcatheter was advanced near the origin of the target vessel. Attempted placement of a 2 mm x 4 cm Target 360 Nano coil proved unsuccessful. Follow-up right vertebral artery angiograms with magnified frontal and lateral views of the head were projections obtained. The microcatheter was then advanced and wedged at the origin of the right targeted PICA branch. The microcatheter was then prepped with DMSO. Following, slow injection of onyx 18 was performed under fluoroscopy. Embolization of the target vessel achieved. Small  reflux into the PICA noted. The catheter was removed under constant aspiration. Follow-up angiogram showed occlusion of PICA at the telovelotonsillar segment. Right vertebral artery angiograms with frontal and lateral views of the entire head to evidence of distal thromboembolic complication it. Flat panel CT of the head was obtained and post processed in a separate workstation with concurrent attending physician supervision. Selected images were sent to PACS. No evidence of hemorrhagic complication noted. The catheter was subsequently withdrawn. The right radial sheath was removed and inflatable band was placed over the access site for patent hemostasis. 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 PLAN: 1. Continued observation in ICU. 2. Stat head CT for any neurological deterioration. Patient and spouse communicated in person. Electronically Signed   By: Pedro Earls M.D.   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 He presented with acute onset of posterior headache, dizziness and vomiting on 09/17/2019. 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 EXAM: DIAGNOSTIC CEREBRAL ANGIOGRAM AND ENDOVASCULAR EMBOLIZATION COMPARISON:  Diagnostic cerebral angiogram 09/18/2019 MEDICATIONS: No antibiotic administered. ANESTHESIA/SEDATION: The procedure was performed under general anesthesia. FLUOROSCOPY TIME:  Fluoroscopy Time: 74 minutes 42 seconds (5436 mGy). COMPLICATIONS: SIR LEVEL B - Normal therapy, includes overnight admission for observation. TECHNIQUE:  Informed written consent was obtained from the patient after a thorough discussion of the procedural risks, benefits and alternatives. All questions were addressed. Maximal Sterile Barrier Technique was utilized including caps, mask, sterile gowns, sterile gloves, sterile drape, hand hygiene and skin antiseptic. A timeout was performed prior to the initiation of the procedure. Real-time ultrasound guidance was utilized for vascular access including the acquisition of a permanent ultrasound image documenting  patency of the accessed vessel. Using the modified Seldinger technique and a micropuncture kit, access was gained to the right radial artery at the wrist and a 6 French sheath was placed. Slow intra arterial infusion of 5,000 IU heparin, 5 mg Verapamil and 200 mcg nitroglycerin diluted in patient's own blood was performed. No significant fluctuation in patient's blood pressure seen. Then, a right radial artery angiogram was obtained via sheath side port. Normal brachial artery branching pattern seen. No significant anatomical variation. The right radial artery caliber is adequate for vascular access. Then, a 5 [place] Simmons 2 glide catheter was navigated over a 0.035 inch Terumo Glidewire into the right subclavian artery. A right subclavian artery roadmap was obtained. The catheter was then advanced into the V2 segment of the right vertebral artery. Townes and lateral views of the head were obtained centered on the posterior fossa. 3D rotational angiograms were acquired and post processed in a separate workstation under concurrent attending physician supervision. Selected images were sent to PACS. FINDINGS: Again seen is a 5 mm dissecting aneurysm as seen on prior angiogram. However, it appears that this aneurysm originates from a dissected branch off of the telovelotonsillar segment of the right posterior inferior cerebellar artery (PICA). The right vertebral artery, basilar artery, and bilateral posterior  cerebral arteries are unremarkable. No abnormally high-flow, early draining veins are seen. No regions of abnormal hypervascularity are noted. The visualized dural sinuses are patent. PROCEDURE: Under biplane roadmap, the Simmons 2 catheter was removed over the wire. Intra arterial infusion of 5000 units of heparin, 200 mcg of nitroglycerin and 5 mg of verapamil was performed to the right radial artery via sheath side port. Then, a benchmark guide catheter was advanced over the wire and under fluoroscopic guidance into the distal V2 segment of the right vertebral artery. Magnified frontal and lateral views of the head were obtained in the working projections. Using biplane roadmap, a headway duo microcatheter was coaxially advanced over a synchro 2 micro guidewire into the right vertebral artery and then navigated into the right posteroinferior cerebellar artery. Frontal and lateral angiograms were obtained with microcatheter contrast injection. The microcatheter was advanced near the origin of the target vessel. Attempted placement of a 2 mm x 4 cm Target 360 Nano coil proved unsuccessful. Follow-up right vertebral artery angiograms with magnified frontal and lateral views of the head were projections obtained. The microcatheter was then advanced and wedged at the origin of the right targeted PICA branch. The microcatheter was then prepped with DMSO. Following, slow injection of onyx 18 was performed under fluoroscopy. Embolization of the target vessel achieved. Small reflux into the PICA noted. The catheter was removed under constant aspiration. Follow-up angiogram showed occlusion of PICA at the telovelotonsillar segment. Right vertebral artery angiograms with frontal and lateral views of the entire head to evidence of distal thromboembolic complication it. Flat panel CT of the head was obtained and post processed in a separate workstation with concurrent attending physician supervision. Selected images were sent  to PACS. No evidence of hemorrhagic complication noted. The catheter was subsequently withdrawn. The right radial sheath was removed and inflatable band was placed over the access site for patent hemostasis. 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 PLAN: 1. Continued observation in ICU. 2. Stat head CT for any neurological deterioration. Patient and spouse communicated in person. Electronically Signed   By: Pedro Earls M.D.   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 He presented with acute onset of posterior headache, dizziness and vomiting on 09/17/2019. 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 EXAM: DIAGNOSTIC CEREBRAL ANGIOGRAM AND ENDOVASCULAR EMBOLIZATION COMPARISON:  Diagnostic cerebral angiogram 09/18/2019 MEDICATIONS: No antibiotic administered. ANESTHESIA/SEDATION: The procedure was performed under general anesthesia. FLUOROSCOPY TIME:  Fluoroscopy Time: 74 minutes 42 seconds (5436 mGy). COMPLICATIONS: SIR LEVEL B - Normal therapy, includes overnight admission for observation. TECHNIQUE: Informed written consent was obtained from the patient after a thorough discussion of the procedural risks, benefits and alternatives. All questions were addressed. Maximal Sterile Barrier Technique was utilized including caps, mask, sterile gowns, sterile gloves, sterile drape, hand hygiene and skin antiseptic. A timeout was performed prior to the initiation of the procedure. Real-time ultrasound guidance was utilized for vascular  access including the acquisition of a permanent ultrasound image documenting patency of the accessed vessel. Using the modified Seldinger technique and a micropuncture kit, access was gained to the right radial artery at the wrist and a 6 French sheath was placed. Slow intra arterial infusion of 5,000 IU heparin, 5 mg Verapamil and 200 mcg nitroglycerin diluted in patient's own blood was performed. No significant fluctuation in patient's blood pressure seen. Then, a right radial artery angiogram was obtained via sheath side port. Normal brachial artery branching pattern seen. No significant anatomical variation. The right radial artery caliber is adequate for vascular access. Then, a 5 [place] Simmons 2 glide catheter was navigated over a 0.035 inch Terumo Glidewire into the right subclavian artery. A right subclavian artery roadmap was obtained. The catheter was then advanced into the V2 segment of the right vertebral artery. Townes and lateral views of the head were obtained centered on the posterior fossa. 3D rotational angiograms were acquired and post processed in a separate workstation under concurrent attending physician supervision. Selected images were sent to PACS. FINDINGS: Again seen is a 5 mm dissecting aneurysm as seen on prior angiogram. However, it appears that this aneurysm originates from a dissected branch off of the telovelotonsillar segment of the right posterior inferior cerebellar artery (PICA). The right vertebral artery, basilar artery, and bilateral posterior cerebral arteries are unremarkable. No abnormally high-flow, early draining veins are seen. No regions of abnormal hypervascularity are noted. The visualized dural sinuses are patent. PROCEDURE: Under biplane roadmap, the Simmons 2 catheter was removed over the wire. Intra arterial infusion of 5000 units of heparin, 200 mcg of nitroglycerin and 5 mg of verapamil was performed to the right radial artery via sheath side port. Then, a  benchmark guide catheter was advanced over the wire and under fluoroscopic guidance into the distal V2 segment of the right vertebral artery. Magnified frontal and lateral views of the head were obtained in the working projections. Using biplane roadmap, a headway duo microcatheter was coaxially advanced over a synchro 2 micro guidewire into the right vertebral artery and then navigated into the right posteroinferior cerebellar artery. Frontal and lateral angiograms were obtained with microcatheter contrast injection. The microcatheter was advanced near the origin of the target vessel. Attempted placement of a 2 mm x 4 cm Target 360 Nano coil proved unsuccessful. Follow-up right vertebral artery angiograms with magnified frontal and lateral views of the head  were projections obtained. The microcatheter was then advanced and wedged at the origin of the right targeted PICA branch. The microcatheter was then prepped with DMSO. Following, slow injection of onyx 18 was performed under fluoroscopy. Embolization of the target vessel achieved. Small reflux into the PICA noted. The catheter was removed under constant aspiration. Follow-up angiogram showed occlusion of PICA at the telovelotonsillar segment. Right vertebral artery angiograms with frontal and lateral views of the entire head to evidence of distal thromboembolic complication it. Flat panel CT of the head was obtained and post processed in a separate workstation with concurrent attending physician supervision. Selected images were sent to PACS. No evidence of hemorrhagic complication noted. The catheter was subsequently withdrawn. The right radial sheath was removed and inflatable band was placed over the access site for patent hemostasis. 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 PLAN: 1. Continued observation in ICU. 2. Stat head CT for any neurological deterioration. Patient  and spouse communicated in person. Electronically Signed   By: Pedro Earls M.D.   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 He presented with acute onset of posterior headache, dizziness and vomiting on 09/17/2019. 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 EXAM: DIAGNOSTIC CEREBRAL ANGIOGRAM AND ENDOVASCULAR EMBOLIZATION COMPARISON:  Diagnostic cerebral angiogram 09/18/2019 MEDICATIONS: No antibiotic administered. ANESTHESIA/SEDATION: The procedure was performed under general anesthesia. FLUOROSCOPY TIME:  Fluoroscopy Time: 74 minutes 42 seconds (5436 mGy). COMPLICATIONS: SIR LEVEL B - Normal therapy, includes overnight admission for observation. TECHNIQUE: Informed written consent was obtained from the patient after a thorough discussion of the procedural risks, benefits and alternatives. All questions were addressed. Maximal Sterile Barrier Technique was utilized including caps, mask, sterile gowns, sterile gloves, sterile drape, hand hygiene and skin antiseptic. A timeout was performed prior to the initiation of the procedure. Real-time ultrasound guidance was utilized for vascular access including the acquisition of a permanent ultrasound image documenting patency of the accessed vessel. Using the modified Seldinger technique and a micropuncture kit, access was gained to the right radial artery at the wrist and a 6 French sheath was placed. Slow intra arterial infusion of 5,000 IU heparin, 5 mg Verapamil and 200 mcg nitroglycerin diluted in patient's own blood was performed. No significant  fluctuation in patient's blood pressure seen. Then, a right radial artery angiogram was obtained via sheath side port. Normal brachial artery branching pattern seen. No significant anatomical variation. The right radial artery caliber is adequate for vascular access. Then, a 5 [place] Simmons 2 glide catheter was navigated over a 0.035 inch Terumo Glidewire into the right subclavian artery. A right subclavian artery roadmap was obtained. The catheter was then advanced into the V2 segment of the right vertebral artery. Townes and lateral views of the head were obtained centered on the posterior fossa. 3D rotational angiograms were acquired and post processed in a separate workstation under concurrent attending physician supervision. Selected images were sent to PACS. FINDINGS: Again seen is a 5 mm dissecting aneurysm as seen on prior angiogram. However, it appears that this aneurysm originates from a dissected branch off of the telovelotonsillar segment of the right  posterior inferior cerebellar artery (PICA). The right vertebral artery, basilar artery, and bilateral posterior cerebral arteries are unremarkable. No abnormally high-flow, early draining veins are seen. No regions of abnormal hypervascularity are noted. The visualized dural sinuses are patent. PROCEDURE: Under biplane roadmap, the Simmons 2 catheter was removed over the wire. Intra arterial infusion of 5000 units of heparin, 200 mcg of nitroglycerin and 5 mg of verapamil was performed to the right radial artery via sheath side port. Then, a benchmark guide catheter was advanced over the wire and under fluoroscopic guidance into the distal V2 segment of the right vertebral artery. Magnified frontal and lateral views of the head were obtained in the working projections. Using biplane roadmap, a headway duo microcatheter was coaxially advanced over a synchro 2 micro guidewire into the right vertebral artery and then navigated into the right posteroinferior  cerebellar artery. Frontal and lateral angiograms were obtained with microcatheter contrast injection. The microcatheter was advanced near the origin of the target vessel. Attempted placement of a 2 mm x 4 cm Target 360 Nano coil proved unsuccessful. Follow-up right vertebral artery angiograms with magnified frontal and lateral views of the head were projections obtained. The microcatheter was then advanced and wedged at the origin of the right targeted PICA branch. The microcatheter was then prepped with DMSO. Following, slow injection of onyx 18 was performed under fluoroscopy. Embolization of the target vessel achieved. Small reflux into the PICA noted. The catheter was removed under constant aspiration. Follow-up angiogram showed occlusion of PICA at the telovelotonsillar segment. Right vertebral artery angiograms with frontal and lateral views of the entire head to evidence of distal thromboembolic complication it. Flat panel CT of the head was obtained and post processed in a separate workstation with concurrent attending physician supervision. Selected images were sent to PACS. No evidence of hemorrhagic complication noted. The catheter was subsequently withdrawn. The right radial sheath was removed and inflatable band was placed over the access site for patent hemostasis. 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 PLAN: 1. Continued observation in ICU. 2. Stat head CT for any neurological deterioration. Patient and spouse communicated in person. Electronically Signed   By: Pedro Earls M.D.   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 He presented with acute onset  of posterior headache, dizziness and vomiting on 09/17/2019. 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 EXAM: DIAGNOSTIC CEREBRAL ANGIOGRAM AND ENDOVASCULAR EMBOLIZATION COMPARISON:  Diagnostic cerebral angiogram 09/18/2019 MEDICATIONS: No antibiotic administered. ANESTHESIA/SEDATION: The procedure was performed under general anesthesia. FLUOROSCOPY TIME:  Fluoroscopy Time: 74 minutes 42 seconds (5436 mGy). COMPLICATIONS: SIR LEVEL B - Normal therapy, includes overnight admission for observation. TECHNIQUE: Informed written consent was obtained from the patient after a thorough discussion of the procedural risks, benefits and alternatives. All questions were addressed. Maximal Sterile Barrier Technique was utilized including caps, mask, sterile gowns, sterile gloves, sterile drape, hand hygiene and skin antiseptic. A timeout was performed prior to the initiation of the procedure. Real-time ultrasound guidance was utilized for vascular access including the acquisition of a permanent ultrasound image documenting patency of the accessed vessel. Using the modified  Seldinger technique and a micropuncture kit, access was gained to the right radial artery at the wrist and a 6 French sheath was placed. Slow intra arterial infusion of 5,000 IU heparin, 5 mg Verapamil and 200 mcg nitroglycerin diluted in patient's own blood was performed. No significant fluctuation in patient's blood pressure seen. Then, a right radial artery angiogram was obtained via sheath side port. Normal brachial artery branching pattern seen. No significant anatomical variation. The right radial artery caliber is adequate for vascular access. Then, a 5 [place] Simmons 2 glide catheter was navigated over a 0.035 inch Terumo Glidewire into the right subclavian artery. A right subclavian artery roadmap  was obtained. The catheter was then advanced into the V2 segment of the right vertebral artery. Townes and lateral views of the head were obtained centered on the posterior fossa. 3D rotational angiograms were acquired and post processed in a separate workstation under concurrent attending physician supervision. Selected images were sent to PACS. FINDINGS: Again seen is a 5 mm dissecting aneurysm as seen on prior angiogram. However, it appears that this aneurysm originates from a dissected branch off of the telovelotonsillar segment of the right posterior inferior cerebellar artery (PICA). The right vertebral artery, basilar artery, and bilateral posterior cerebral arteries are unremarkable. No abnormally high-flow, early draining veins are seen. No regions of abnormal hypervascularity are noted. The visualized dural sinuses are patent. PROCEDURE: Under biplane roadmap, the Simmons 2 catheter was removed over the wire. Intra arterial infusion of 5000 units of heparin, 200 mcg of nitroglycerin and 5 mg of verapamil was performed to the right radial artery via sheath side port. Then, a benchmark guide catheter was advanced over the wire and under fluoroscopic guidance into the distal V2 segment of the right vertebral artery. Magnified frontal and lateral views of the head were obtained in the working projections. Using biplane roadmap, a headway duo microcatheter was coaxially advanced over a synchro 2 micro guidewire into the right vertebral artery and then navigated into the right posteroinferior cerebellar artery. Frontal and lateral angiograms were obtained with microcatheter contrast injection. The microcatheter was advanced near the origin of the target vessel. Attempted placement of a 2 mm x 4 cm Target 360 Nano coil proved unsuccessful. Follow-up right vertebral artery angiograms with magnified frontal and lateral views of the head were projections obtained. The microcatheter was then advanced and wedged at  the origin of the right targeted PICA branch. The microcatheter was then prepped with DMSO. Following, slow injection of onyx 18 was performed under fluoroscopy. Embolization of the target vessel achieved. Small reflux into the PICA noted. The catheter was removed under constant aspiration. Follow-up angiogram showed occlusion of PICA at the telovelotonsillar segment. Right vertebral artery angiograms with frontal and lateral views of the entire head to evidence of distal thromboembolic complication it. Flat panel CT of the head was obtained and post processed in a separate workstation with concurrent attending physician supervision. Selected images were sent to PACS. No evidence of hemorrhagic complication noted. The catheter was subsequently withdrawn. The right radial sheath was removed and inflatable band was placed over the access site for patent hemostasis. 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 PLAN: 1. Continued observation in ICU. 2. Stat head CT for any neurological deterioration. Patient and spouse communicated in person. Electronically Signed   By: Pedro Earls M.D.   On: 09/20/2019 09:33    Labs:  CBC: Recent Labs  09/19/19 0515 09/20/19 1049 09/21/19 0511 09/22/19 0355  WBC 7.4 14.6* 14.4* 13.5*  HGB 11.7* 12.0* 12.3* 13.3  HCT 36.2* 35.9* 36.9* 39.3  PLT 216 237 271 268    COAGS: Recent Labs    09/19/19 0515  INR 1.0    BMP: Recent Labs    09/19/19 0515 09/20/19 1049 09/21/19 0511 09/22/19 0355  NA 141 137 133* 133*  K 4.1 3.8 4.0 4.3  CL 108 106 101 98  CO2 26 23 24 23   GLUCOSE 97 136* 136* 128*  BUN 14 17 17 15   CALCIUM 9.1 8.9 8.8* 9.0  CREATININE 0.94 0.88 0.87 0.84  GFRNONAA >60 >60 >60 >60  GFRAA >60 >60 >60 >60    LIVER FUNCTION TESTS: No results for input(s): BILITOT, AST, ALT, ALKPHOS, PROT, ALBUMIN in the last 8760 hours.  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 Karenann Cai seen today for routine follow up.  Patient more somnolent today but arouses easily, answers questions appropriately and is able to follow commands. LLE slightly more weak than RLE, otherwise neurologically in tact without symptoms. Noted to have LLE edema > RLE which wife reports is chronic however [place] has been ordered by primary team to evaluate. Patient otherwise stable, planned for CIR admission for further rehab.  Continue ASA 325 mg QD, further plans per neurology - NIR remains available.  Please call with questions or concerns.   Electronically Signed: Joaquim Nam, PA-C 09/23/2019, 10:29 AM   I spent a total of 15 Minutes at the the patient's bedside AND on the patient's hospital floor or unit, greater than 50% of which was counseling/coordinating care for follow up dissecting right PICA branch aneurysm s/p embolization.

## 2019-09-23 NOTE — Consult Note (Signed)
Physical Medicine and Rehabilitation Consult   Reason for Consult: Stroke with functional deficits.  Referring Physician: Dr. Erlinda Hong   HPI: Andrew Perez is a 54 y.o. 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 Bleed felt to be hypertensive in nature. He was started on cleviprex for BP management and Dr. Ronnald Ramp consulted for input. CT angio recommended for work up with SBP<140 and NS signed off as no hydrocephalus noted.  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 Katherina Right on 06/03. Post procedure with nausea and Repeat CT head showed right more than left cerebellar infarct and MRI confirmed evolving R>L PIC territory infarct --similar in extension c/w prior CT.   2D echo showed EF 55-60% and moderate LVH and moderately dilated left atrium. Now on ASA for secondary stroke prevention. BP remains labile requiring Cleviprex. Therapy ongoing and CIR recommended due to balance deficits affecting functional status.     Review of Systems  Constitutional: Positive for malaise/fatigue.  HENT: Negative for hearing loss.   Eyes: Positive for blurred vision.  Respiratory: Negative for cough.   Cardiovascular: Negative for chest pain.  Gastrointestinal: Negative for vomiting.  Musculoskeletal: Negative for neck pain.  Skin: Negative for rash.  Neurological: Positive for dizziness and weakness.      Past Medical History:  Diagnosis Date   Hypertension    IVH (intraventricular hemorrhage) ([place]) 2018   due to HTN   Prolactinoma ([place])    followed by [facility]   Sleep apnea     Past Surgical History:  Procedure Laterality Date   IR  3D INDEPENDENT WKST  09/19/2019   IR ANGIO INTRA EXTRACRAN SEL COM CAROTID INNOMINATE BILAT MOD SED  09/18/2019   IR ANGIO VERTEBRAL SEL SUBCLAVIAN INNOMINATE UNI L MOD SED  09/18/2019   IR ANGIO VERTEBRAL SEL VERTEBRAL UNI R MOD SED  09/18/2019   IR ANGIO VERTEBRAL SEL VERTEBRAL UNI R MOD SED  09/19/2019   IR ANGIOGRAM FOLLOW UP STUDY  09/19/2019   IR CT HEAD LTD  09/19/2019   IR NEURO EACH ADD'L AFTER BASIC UNI RIGHT (MS)  09/19/2019   IR TRANSCATH/EMBOLIZ  09/19/2019   IR US GUIDE VASC ACCESS RIGHT  09/18/2019   IR US GUIDE VASC ACCESS RIGHT  09/19/2019   RADIOLOGY WITH ANESTHESIA N/A 09/19/2019   Procedure: RADIOLOGY WITH ANESTHESIA ANUERYSM COILING;  Surgeon: Pedro Earls, MD;  Location: [place];  Service: Radiology;  Laterality: N/A;   TONSILLECTOMY      Family History  Problem Relation Age of Onset   Colon cancer Father      Social History:  reports that he has never smoked. He has never used smokeless tobacco. He reports that he does not drink alcohol or use drugs.    Allergies  Allergen Reactions   Codeine Shortness Of Breath   Shellfish Allergy Shortness Of Breath    Medications Prior to Admission  Medication Sig Dispense Refill   amLODipine (NORVASC) 10 MG tablet Take 1 tablet (10 mg total) by mouth daily. 30 tablet 6   cabergoline (DOSTINEX) 0.5 MG tablet Take 0.5 mg by mouth 2 (two) times a  week. Saturday and wednesday  0   cloNIDine (CATAPRES - DOSED IN MG/24 HR) 0.3 mg/24hr patch Place 0.3 mg onto the skin every 7 (seven) days. Saturday     hydrALAZINE (APRESOLINE) 100 MG tablet Take 1 tablet (100 mg total) by mouth every 8 (eight) hours. (Patient taking differently: Take 100 mg by mouth 3 (three) times daily. ) 90 tablet 6   levothyroxine (SYNTHROID, LEVOTHROID) 100 MCG tablet Take 100 mcg by mouth daily.     losartan (COZAAR) 100 MG tablet Take 100 mg by mouth daily.     cloNIDine (CATAPRES) 0.1 MG tablet One tablet three times daily (Patient not  taking: Reported on 04/13/2018) 90 tablet 6   lisinopril (PRINIVIL,ZESTRIL) 20 MG tablet Take 1 tablet (20 mg total) by mouth 2 (two) times daily. (Patient not taking: Reported on 04/13/2018) 60 tablet 6    Home: [place] expects to be discharged to:: Private residence Living Arrangements: Spouse/significant other Available Help at Discharge: Family Type of Home: House Home Access: Stairs to enter Technical brewer of Steps: 4-5 Entrance Stairs-Rails: Right, Left, Can reach both Home Layout: One level Bathroom Shower/Tub: Multimedia programmer: Handicapped height Bathroom Accessibility: Yes Home Equipment: Environmental consultant - 2 wheels, Bedside commode, Wheelchair - manual, Shower seat  Lives With: Spouse, Son  Functional History: Prior Function Level of Independence: Independent Comments: works as a Tax adviser Status:  Mobility: [place] bed mobility: Needs Assistance Bed Mobility: Supine to Sit Supine to sit: Supervision Sit to supine: Mod assist General bed mobility comments: modA to return to supine for LE management Transfers Overall transfer level: Needs assistance Equipment used: Rolling walker (2 wheeled) Transfers: Sit to/from Stand Sit to Stand: Min assist, +2 physical assistance, From elevated surface(modAx2 from recliner) General transfer comment: pt requires increased assistance to power up from lower seat, verbal cues provided for hand placement Ambulation/Gait Ambulation/Gait assistance: Min assist, +2 physical assistance(with 3rd person for chair follow) Gait Distance (Feet): 12 Feet Assistive device: Rolling walker (2 wheeled) Gait Pattern/deviations: Step-to pattern, Trunk flexed, Staggering left, Staggering right General Gait Details: pt with increased lateral sway drifting and intermittently staggering left/right. Pt with increased trunk flexion and requiring multiple cues to bring RW closer to BOS. Pt will likely  benefit from wider bariatric RW at next session to allow closer proximity to his base of support Gait velocity: reduced Gait velocity interpretation: <1.8 ft/sec, indicate of risk for recurrent falls    ADL: ADL Overall ADL's : Needs assistance/impaired Eating/Feeding: Set up, Sitting Eating/Feeding Details (indicate cue type and reason): pt is extending head and shifting weight to place objects in mouth Grooming: Min guard, Sitting Upper Body Bathing: Minimal assistance Lower Body Bathing: Total assistance Toilet Transfer: +2 for physical assistance, Moderate assistance, Stand-pivot, RW Toilet Transfer Details (indicate cue type and reason): simulated OOB to chair Toileting - Clothing Manipulation Details (indicate cue type and reason): total Functional mobility during ADLs: +2 for physical assistance, Moderate assistance, Rolling walker General ADL Comments: pt with decrease balance with RW but reports "its fine"  Cognition: Cognition Overall Cognitive Status: Impaired/Different from baseline Orientation Level: Oriented to person, Oriented to place, Oriented to situation Cognition Arousal/Alertness: Awake/alert Behavior During Therapy: Flat affect Overall Cognitive Status: Impaired/Different from baseline Area of Impairment: Attention, Memory, Safety/judgement, Following commands, Awareness, Problem solving Current Attention Level: Sustained Memory: Decreased recall of precautions, Decreased short-term memory Following Commands: Follows one step commands inconsistently, Follows one step commands with increased time Safety/Judgement: Decreased awareness of  safety, Decreased awareness of deficits Awareness: Intellectual Problem Solving: Slow processing, Decreased initiation, Difficulty sequencing General Comments: pt giving very short brisk responses to open ended questions. pt state vision is not blurry but then states back of his head is blurry. pt then with more cueing states his  vision is blurry until he focuses on the object then its not blurry . pt reports its been happening. when pushed to define states few days pt then further states it started after the coiling procedure.   Blood pressure 127/60, pulse 65, temperature 99.7 F (37.6 C), temperature source Oral, resp. rate 14, height 5\' 11"  (1.803 m), weight (!) 189.8 kg, SpO2 99 %. Physical Exam  Constitutional:  Obese male lying in bed  HENT:  Head: Normocephalic and atraumatic.  Eyes: Pupils are equal, round, and reactive to light.  Cardiovascular: Normal rate.  Respiratory: Effort normal.  GI: He exhibits distension.  Musculoskeletal:     Cervical back: Normal range of motion.     Comments: 2+ LE edema, chronic  Neurological:  Awakens to verbal and tactile stim. Follows basic commands. Falls back to sleep quickly. UE 4/5 prox to distal. LE 3/5 HF/KE and 3-4/5 distally with inconsistent effort. Ataxic/depth perception difficulties with UE movement. Speech dysarthric  Skin: Skin is warm.  Psychiatric:  Flat but cooperative    No results found for this or any previous visit (from the past 24 hour(s)). No results found.   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 Does the need for close, 24 hr/day medical supervision in concert with the patient's rehab needs make it unreasonable for this patient to be served in a less intensive setting? Yes 2. Co-Morbidities requiring supervision/potential complications: morbid obesity, HTN 3. Due to bladder management, bowel management, safety, skin/wound care, disease management, medication administration, pain management and patient education, does the patient require 24 hr/day rehab nursing? Yes 4. Does the patient require coordinated care of a physician, rehab nurse, therapy disciplines of PT, OT, SLP to address physical and functional deficits in the context of the above medical  diagnosis(es)? Yes Addressing deficits in the following areas: balance, endurance, locomotion, strength, transferring, bowel/bladder control, bathing, dressing, feeding, grooming, toileting, cognition, speech and psychosocial support 5. Can the patient actively participate in an intensive therapy program of at least 3 hrs of therapy per day at least 5 days per week? Yes 6. The potential for patient to make measurable gains while on inpatient rehab is excellent 7. Anticipated functional outcomes upon discharge from inpatient rehab are modified independent and supervision  with PT, modified independent and supervision with OT, modified independent with SLP. 8. Estimated rehab length of stay to reach the above functional goals is: 13-16 days 9. Anticipated discharge destination: Home 10. Overall Rehab/Functional Prognosis: excellent  RECOMMENDATIONS: This patient's condition is appropriate for continued rehabilitative care in the following setting: CIR Patient has agreed to participate in recommended program. Yes Note that insurance prior authorization may be required for reimbursement for recommended care.  Comment: Rehab Admissions Coordinator to follow up.  Thanks,  Meredith Staggers, MD, Mellody Drown  I have personally performed a face to face diagnostic evaluation of this patient. Additionally, I have examined pertinent labs and radiographic images. I have reviewed and concur with the physician assistant's documentation above.    Bary Leriche, PA-C 09/23/2019

## 2019-09-23 NOTE — Progress Notes (Signed)
Physical Therapy Treatment Patient Details Name: Andrew Perez MRN: [id number] DOB: 07-14-1965 Today's Date: 09/23/2019    History of Present Illness 54 yo male admitted with IVH of 4th ventricle  PMH HTN  morbid obese previous IVH 2018. 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     PT Comments    Pt with regression of function today requiring maxAx2 for all mobility today compared to min/modAx2. Pt with L LE more swollen than Rolinda Roan NP notified. Pt found soiled in urine. Pt stood x2 with maxAx2 and was dependent for pericare. Pt unable to stand upright fully today despite max verbal and tactile cues. Pt with with L lateral and posterior lean. Pt states "I'm weaker today." Acute PT to continue to follow.    Follow Up Recommendations  CIR;Supervision/Assistance - 24 hour     Equipment Recommendations  Rolling walker with 5" wheels    Recommendations for Other Services Rehab consult     Precautions / Restrictions Precautions Precautions: Fall Precaution Comments: L sided weekness, obese, bilat LE edema-wears compression stocking Restrictions Weight Bearing Restrictions: No    Mobility  Bed Mobility Overal bed mobility: Needs Assistance Bed Mobility: Supine to Sit;Sit to Supine     Supine to sit: Max assist;HOB elevated Sit to supine: Max assist;+2 for physical assistance(for LE management and trunk control descending)   General bed mobility comments: pt requiring maxA for L LE mangement, modA for R LE management, HOB up at 30 deg, maxA for trunk elevation and to bring hips to EOB.  Transfers Overall transfer level: Needs assistance Equipment used: Rolling walker (2 wheeled) Transfers: Sit to/from Stand Sit to Stand: Max assist;+2 physical assistance(with bed elevated)         General transfer comment: pt requiring increased assist today to power up, requiring 2 attempts each attempt to  stand, pt with strong L Lateral lean and unable to straighten L UE or LE, maxA x2 to maintain standing  Ambulation/Gait             General Gait Details: unable to step today due to inability to stand   Stairs             Wheelchair Mobility    Modified Rankin (Stroke Patients Only) Modified Rankin (Stroke Patients Only) Pre-Morbid Rankin Score: No symptoms Modified Rankin: Severe disability     Balance Overall balance assessment: Needs assistance Sitting-balance support: Bilateral upper extremity supported;Feet supported Sitting balance-Leahy Scale: Poor Sitting balance - Comments: pt with continuous lean backwards despite verbal and tactile cues, pt leaning into L side as well, requiring tactile cues and modA to maintain EOB sitting balance   Standing balance support: Bilateral upper extremity supported Standing balance-Leahy Scale: Poor Standing balance comment: maxA x2 to maintain upright posture in walker, pt with strong L lateral lean, unable to straighten L knee or UE to support                            Cognition Arousal/Alertness: Awake/alert Behavior During Therapy: Flat affect Overall Cognitive Status: Impaired/Different from baseline Area of Impairment: Attention;Following commands;Safety/judgement;Awareness;Problem solving                   Current Attention Level: Sustained Memory: Decreased recall of precautions;Decreased short-term memory Following Commands: Follows one step commands with increased time;Follows multi-step commands inconsistently Safety/Judgement: Decreased awareness of safety(pt reports "I  feel weaker today than I did yesterday") Awareness: Intellectual Problem Solving: Slow processing;Decreased initiation;Difficulty sequencing General Comments: pt very delayed with processing and initiation. Pt requiring more verbal and tactile cues      Exercises      General Comments General comments (skin integrity,  edema, etc.): pt with L LE more swollen than the R, assisted RN in donning of compression socks      Pertinent Vitals/Pain Pain Assessment: No/denies pain Pain Intervention(s): Monitored during session    Home Living                      Prior Function            PT Goals (current goals can now be found in the care plan section) Progress towards PT goals: Progressing toward goals    Frequency    Min 4X/week      PT Plan Current plan remains appropriate    Co-evaluation              AM-PAC PT "6 Clicks" Mobility   Outcome Measure  Help needed turning from your back to your side while in a flat bed without using bedrails?: A Lot Help needed moving from lying on your back to sitting on the side of a flat bed without using bedrails?: A Lot Help needed moving to and from a bed to a chair (including a wheelchair)?: A Lot Help needed standing up from a chair using your arms (e.g., wheelchair or bedside chair)?: A Lot Help needed to walk in hospital room?: Total Help needed climbing 3-5 steps with a railing? : Total 6 Click Score: 10    End of Session Equipment Utilized During Treatment: Gait belt Activity Tolerance: Patient limited by fatigue Patient left: in bed;with call bell/phone within reach;with nursing/sitter in room Nurse Communication: Mobility status PT Visit Diagnosis: Unsteadiness on feet (R26.81);Other abnormalities of gait and mobility (R26.89);Other symptoms and signs involving the nervous system (A41.660)     Time: 6301-6010 PT Time Calculation (min) (ACUTE ONLY): 34 min  Charges:  $Therapeutic Activity: 23-37 mins                     Kittie Plater, PT, DPT Acute Rehabilitation Services Pager #: [phone] Office #: [phone]'   [place] 09/23/2019, 10:18 AM

## 2019-09-24 ENCOUNTER — Inpatient Hospital Stay (HOSPITAL_COMMUNITY): Payer: 59

## 2019-09-24 DIAGNOSIS — I639 Cerebral infarction, unspecified: Secondary | ICD-10-CM

## 2019-09-24 MED ORDER — CLONIDINE HCL 0.3 MG/24HR TD PTWK
0.3000 mg | MEDICATED_PATCH | TRANSDERMAL | Status: DC
  Administered 2019-09-24: 0.3 mg via TRANSDERMAL
  Filled 2019-09-24: qty 1

## 2019-09-24 NOTE — Hospital Course (Signed)
°  CT HEAD WO CONTRAST 09/20/2019 IMPRESSION:  Right more than left inferior cerebellar infarction. No hydrocephalus or interval hemorrhage.   MR BRAIN WO CONTRAST 09/20/2019 IMPRESSION:  Evolving bilateral PICA territory infarct, similar in extension to prior CT. There is no hydrocephalus.

## 2019-09-24 NOTE — Progress Notes (Signed)
Physical Therapy Treatment Patient Details Name: Andrew Perez MRN: [id number] DOB: 11-Aug-1965 Today's Date: 09/24/2019    History of Present Illness 54 yo male admitted with IVH of 4th ventricle  PMH HTN  morbid obese previous IVH 2018. 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     PT Comments    Pt seen in conjunction with OT today to progress OOB mobility. Despite sleepiness pt improved from yesterday. Pt unable to maintain eyes opened for longer than 15 seconds. Pt with improved L LE management today, improved EOB mobility, and was able to amb today with RW, modAx2 for about 12' prior to L knee buckling. Pt continues to have vision deficits, see OT notes. Pt also with what appears some R sided inattention despite L UE and LE weakness. Pt  Using L UE as if he was left handed however pt states he is R handed. Continue to recommend CIR upon d/c as patient was indep and working as a Astronomer. Acute PT to cont to follow.    Follow Up Recommendations  CIR;Supervision/Assistance - 24 hour     Equipment Recommendations  Rolling walker with 5" wheels    Recommendations for Other Services Rehab consult     Precautions / Restrictions Precautions Precautions: Fall(obesity and vision deficits) Precaution Comments: L sided weekness, obese, bilat LE edema-wears compression stocking Restrictions Weight Bearing Restrictions: No    Mobility  Bed Mobility Overal bed mobility: Needs Assistance Bed Mobility: Supine to Sit     Supine to sit: Min assist;HOB elevated     General bed mobility comments: HOB elevated, increased time, minA for trunk elevation and to scoot hips to EOB  Transfers Overall transfer level: Needs assistance Equipment used: Rolling walker (2 wheeled) Transfers: Sit to/from Stand Sit to Stand: Mod assist;+2 physical assistance         General transfer comment: bed elevated, max  multimodal cues, freq v/c's to maintain eyes opened, pt only able to maintain eyes opened for <15sec, modAx2 to power up and steady during transition of hands from bed to RW, wide base of support  Ambulation/Gait Ambulation/Gait assistance: Mod assist;+2 physical assistance Gait Distance (Feet): 12 Feet Assistive device: Rolling walker (2 wheeled) Gait Pattern/deviations: Step-to pattern;Decreased stride length;Trunk flexed Gait velocity: reduced Gait velocity interpretation: <1.8 ft/sec, indicate of risk for recurrent falls General Gait Details: pt with impaired proprioception of L LE, possibly impaired L LE senstation despite pt denying it. Pt with L LE weakness into L knee buckling with onset of fatigue requiring seated rest break   Stairs             Wheelchair Mobility    Modified Rankin (Stroke Patients Only) Modified Rankin (Stroke Patients Only) Pre-Morbid Rankin Score: No symptoms Modified Rankin: Moderately severe disability     Balance Overall balance assessment: Needs assistance Sitting-balance support: Bilateral upper extremity supported;Feet supported Sitting balance-Leahy Scale: Fair Sitting balance - Comments: pt with improved balance however with L lateral lean bias with increased sitting EOB tolerance/fatigue setting in   Standing balance support: Bilateral upper extremity supported Standing balance-Leahy Scale: Poor Standing balance comment: requires support of RW and physical assist to maintain standing. verbal cues to straighten L knee                             Cognition Arousal/Alertness: Lethargic(sleepy but able to answer questions when  asked) Behavior During Therapy: Flat affect Overall Cognitive Status: Impaired/Different from baseline Area of Impairment: Attention;Memory;Following commands;Safety/judgement;Awareness;Problem solving                   Current Attention Level: Sustained Memory: Decreased short-term  memory(doesn't remember working with PT yesterday) Following Commands: Follows one step commands with increased time;Follows one step commands inconsistently Safety/Judgement: Decreased awareness of safety;Decreased awareness of deficits(some R side inattention) Awareness: Intellectual Problem Solving: Slow processing;Decreased initiation;Difficulty sequencing General Comments: pt delayed response requiring freq verbal cues to stay on task and complete task      Exercises      General Comments General comments (skin integrity, edema, etc.): pt with bilat LE compression stockings on, continues with vision deficits, see OT notes      Pertinent Vitals/Pain Pain Assessment: No/denies pain    Home Living                      Prior Function            PT Goals (current goals can now be found in the care plan section) Progress towards PT goals: Progressing toward goals    Frequency    Min 4X/week      PT Plan Current plan remains appropriate    Co-evaluation PT/OT/SLP Co-Evaluation/Treatment: Yes Reason for Co-Treatment: For patient/therapist safety PT goals addressed during session: Mobility/safety with mobility        AM-PAC PT "6 Clicks" Mobility   Outcome Measure  Help needed turning from your back to your side while in a flat bed without using bedrails?: A Lot Help needed moving from lying on your back to sitting on the side of a flat bed without using bedrails?: A Lot Help needed moving to and from a bed to a chair (including a wheelchair)?: A Lot Help needed standing up from a chair using your arms (e.g., wheelchair or bedside chair)?: A Lot Help needed to walk in hospital room?: A Lot Help needed climbing 3-5 steps with a railing? : Total 6 Click Score: 11    End of Session Equipment Utilized During Treatment: Gait belt Activity Tolerance: Patient limited by fatigue Patient left: in chair;with call bell/phone within reach;with chair alarm set;with  family/visitor present Nurse Communication: Mobility status PT Visit Diagnosis: Unsteadiness on feet (R26.81);Other abnormalities of gait and mobility (R26.89);Other symptoms and signs involving the nervous system (R29.898)     Time: 2774-1287 PT Time Calculation (min) (ACUTE ONLY): 36 min  Charges:  $Gait Training: 8-22 mins                     Kittie Plater, PT, DPT Acute Rehabilitation Services Pager #: [phone] Office #: [phone]    [place] L[place] 09/24/2019, 1:33 PM

## 2019-09-24 NOTE — Progress Notes (Signed)
Inpatient Rehabilitation-Admissions Coordinator   Met with pt and his wife bedside after therapy session this AM. Pt was making progress with therapy but still has considerable deficits that would benefit from CIR. Discussed recommended rehab program and reviewed recommendations made from PM&R MD, Dr Naaman Plummer (please see his consult note for details). We discussed the anticipated LOS, expected functional outcomes, and therapy expectations. Pt and his wife seem very interested in this program and would like to proceed. Discussed that pt is currently on Cleviprex and AC will begin insurance auth process once medically ready.   Raechel Ache, OTR/L  Rehab Admissions Coordinator  [phone] 09/24/2019 12:52 PM

## 2019-09-24 NOTE — Progress Notes (Addendum)
STROKE TEAM PROGRESS NOTE   INTERVAL HISTORY  His wife is at the bedside. She reports a hx of falling asleep following BP meds in the past. This am, RN concerned with worsening lethargy - will fall asleep while eating or brushing his teeth. During rounds, now with wife present, he is much more awake. Will check CT given ongoing fluctuations.  I have reviewed patient's electronic medical records in [place] and he was hospitalized in [place] in 2018 secondary to severe uncontrolled hypertension.  He was only on Norvasc at home and subsequently required 3 other agents to be added in the hospital.  Had a fourth ventricular hemorrhage and no clear etiology was found.  Extensive work-up for pheochromocytoma, renal artery stenosis was negative.  He does follow-up with endocrinologist at [facility] for his pituitary tumor and post op care and hormonal management Follow-up CT scan of the head today shows evolving cerebellar infarcts with slight mass-effect and dilatation of the frontal horns and third ventricle and questionable hydrocephalus. Vitals:   09/24/19 0500 09/24/19 0600 09/24/19 0714 09/24/19 0800  BP: (!) 152/80  (!) 149/67 (!) 156/72  Pulse: 60 67 74 64  Resp: 15 15 14 16   Temp:    98.1 F (36.7 C)  TempSrc:      SpO2: 99% 91% 98% 98%  Weight:      Height:       CBC:  Recent Labs  Lab 09/22/19 0355 09/23/19 1251  WBC 13.5* 11.5*  HGB 13.3 12.2*  HCT 39.3 35.6*  MCV 87.7 85.8  PLT 268 664   Basic Metabolic Panel:  Recent Labs  Lab 09/22/19 0355 09/23/19 1251  NA 133* 131*  K 4.3 4.1  CL 98 96*  CO2 23 25  GLUCOSE 128* 115*  BUN 15 17  CREATININE 0.84 0.80  CALCIUM 9.0 8.8*   Lipid Panel:     Component Value Date/Time   CHOL 159 09/19/2019 0515   TRIG 100 09/19/2019 0515   HDL 35 (L) 09/19/2019 0515   CHOLHDL 4.5 09/19/2019 0515   VLDL 20 09/19/2019 0515   LDLCALC 104 (H) 09/19/2019 0515   HgbA1c:  Lab Results  Component Value Date   HGBA1C 4.7 (L) 09/18/2019    Urine Drug Screen:     Component Value Date/Time   LABOPIA POSITIVE (A) 09/17/2019 2126   COCAINSCRNUR NONE DETECTED 09/17/2019 2126   LABBENZ NONE DETECTED 09/17/2019 2126   AMPHETMU NONE DETECTED 09/17/2019 2126   THCU NONE DETECTED 09/17/2019 2126   LABBARB NONE DETECTED 09/17/2019 2126    Alcohol Level No results found for: ETH  IMAGING past 24 hours No results found.   PHYSICAL EXAM   General -morbidly obese middle-aged Caucasian male well developed, not in acute distress.  Ophthalmologic - fundi not visualized due to noncooperation.  Cardiovascular - Regular rhythm and rate.  Neuro - awake alert and oriented to time place and person.. No aphasia, but paucity of speech, no dysarthria. Able to name and repeat. PERRL, EOMI, no nystagmus on bilateral gaze but saccadic dysmetria on left lateral gaze..  Facial symmetrical. visual field full. Tongue midline. Moving all extremities equally but lower extremities less than upper extremities. B/l FTN intact no ataxia. Sensation intact, gait not tested.    ASSESSMENT/PLAN Mr. SVEN PINHEIRO is a 54 y.o. 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 SBP > 190.  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 small volume IVH in 4th ventricle. Interval resection previous central skull base infiltrative mass. 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 No hydrocephalus. Known sellar mass w/ skull base erosion.   Cerebral angio right PICA 35mm aneurysm   MRI - Evolving bilateral PICA territory  infarct, similar in extension to prior CT. There is no hydrocephalus.   Repeat CT head 6/8 /21 evolving nonhemorrhagic cerebral infarcts right greater than left*   2D Echo EF 55-60%. No source of embolus. LA moderate dilated  LDL 104  HgbA1c 4.7  UDS + opiates  SCDs for VTE prophylaxis  No antithrombotic prior to admission, was on No antithrombotic given hemorrhage. Post aneurysm embolization, now off heparin IV, on ASA 325.   Therapy recommendations:  CIR  Disposition:  Pending  Right PICA aneurysm  Cerebral angiogram showed right PICA 5 mm aneurysm  Aneurysm management with right PICA embolization 6/3  Expecting small right cerebellar infarct  CT bilateral cerebellar infarct, right more than left  MRI confirmed bilateral PICA infarcts, right more than left.  No hydrocephalus  Off heparin IV, on aspirin 325  History of IVH  2018 - admitted for headache, MRI and CT showed IVH involving the 4th ventricle, and old right BG infarct.  Carotid Doppler negative.  EF 55 to 60%.  LDL 27 and A1c 4.6.  CTA head at that time showed right PICA patent.  Prolactinoma    Followed by Dr. Maurie Boettcher and endocrinologist at [facility]  On cabergoline therapy since March 2018 with decrease in mass size on follow-up imaging  Being followed by surveillance with MRIs  Cabergoline every Wed and Sat  Hypertensive Emergency  SBP > 190 on arrival   Home meds:  amlodipine 10, hydralazine 100 q8, losartan 100  Now still on cleviprex   Resumed home medication  Add HCTZ 50 and clonidine 0.1 tid -> 0.2 tid . SBP goal <160 (SBP 141 - 160 today) . Long-term BP goal normotensive  Hyperlipidemia  LDL 104  on no statin PTA  Add lipitor 40 in the setting of stroke  Continue statin on discharge.  Other Stroke Risk Factors  Morbid Obesity, Body mass index is 58.36 kg/m., recommend weight loss, diet and exercise as appropriate   Obstructive sleep apnea  Other Active  Problems  Hypothyroid on synthroid  Leukocytosis - 14.6->14.4 (afebrile)->13.5  Mild hyponatremia - 133->133  Hospital day # 7  [name].  He also became sleepy possibly from clonidine hence will add hydrochlorothiazide and hopefully get him off the Cleviprex drip.  Check lower extremity venous Dopplers.  Repeat CT scan of the head since he has had some fluctuation in his mental status to look for hydrocephalus or worsening. Long discussion with patient and wife at the bedside and answered questions. This patient is critically ill and at significant risk of neurological worsening, death and care requires constant monitoring of vital signs, hemodynamics,respiratory and cardiac monitoring, extensive review of multiple databases, frequent neurological assessment, discussion with family, other specialists and medical decision making of high complexity.I have made any additions or clarifications directly to the above  note.This critical care time does not reflect procedure time, or teaching time or supervisory time of PA/NP/Med Resident etc but could involve care discussion time.  I spent 30 minutes of neurocritical care time  in the care of  this patient.      Antony Contras, MD Medical Director [facility] Pager: [phone] 09/24/2019 9:46 AM  To contact Stroke Continuity provider, please refer to [email or web address]  After hours, contact General Neurology

## 2019-09-24 NOTE — Progress Notes (Signed)
Occupational Therapy Treatment Note  Pt with complaints of dizziness after further assessment of vision. Pt with complaints of blurred vision and is unable to read normal size font. Note difficulty with stabilizing gaze and sustaining visual attention. No complaints of diplopia. Required +2 Mod A for safe mobility and appears to demonstrate R inattention. Wife present for session and began education on strategies to help with visual deficits. Continue to recommend intensive rehab at [facility] to facilitate safe DC home.     09/24/19 1359  OT Visit Information  Last OT Received On 09/24/19  Assistance Needed +2  PT/OT/SLP Co-Evaluation/Treatment Yes  Reason for Co-Treatment Complexity of the patient's impairments (multi-system involvement);For patient/therapist safety;To address functional/ADL transfers  OT goals addressed during session ADL's and self-care;Strengthening/ROM  History of Present Illness 54 yo male admitted with IVH of 4th ventricle  PMH HTN  morbid obese previous IVH 2018. 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   Precautions  Precautions Fall (obesity and vision deficits)  Precaution Comments L sided weekness, obese, bilat LE edema-wears compression stocking  Pain Assessment  Pain Assessment No/denies pain  Cognition  Arousal/Alertness Lethargic (sleepy but able to answer questions when asked)  Behavior During Therapy Flat affect  Overall Cognitive Status Impaired/Different from baseline  Area of Impairment Attention;Memory;Following commands;Safety/judgement;Awareness;Problem solving;Orientation  Orientation Level Time (June 12th)  Current Attention Level Sustained  Memory Decreased short-term memory (doesn't remember working with PT yesterday)  Following Commands Follows one step commands with increased time;Follows one step commands inconsistently  Safety/Judgement Decreased awareness of  safety;Decreased awareness of deficits (some R side inattention)  Awareness Intellectual  Problem Solving Slow processing;Decreased initiation;Difficulty sequencing  General Comments pt delayed response requiring freq verbal cues to stay on task and complete task  Upper Extremity Assessment  Upper Extremity Assessment RUE deficits/detail  ADL  Overall ADL's  Needs assistance/impaired  Eating/Feeding Minimal assistance  Eating/Feeding Details (indicate cue type and reason) poor depth perception; unable to locate utensils on tray without VC; able to complete hand to mouth pattern however pt's wife has been feeding him. Educated wife on need for pt to self feed as much as possible  Grooming Minimal assistance;Sitting  Upper Body Bathing Minimal assistance;Sitting  Toilet Transfer +2 for physical assistance;Moderate assistance;Stand-pivot;RW  Functional mobility during ADLs +2 for physical assistance;Moderate assistance;Rolling walker  Bed Mobility  Overal bed mobility Needs Assistance  Bed Mobility Supine to Sit  Supine to sit Min assist;HOB elevated  General bed mobility comments HOB elevated, increased time, minA for trunk elevation and to scoot hips to EOB  Balance  Overall balance assessment Needs assistance  Sitting-balance support Bilateral upper extremity supported;Feet supported  Sitting balance-Leahy Scale Fair  Sitting balance - Comments pt with improved balance however with L lateral lean bias with increased sitting EOB tolerance/fatigue setting in  Standing balance support Bilateral upper extremity supported  Standing balance-Leahy Scale Poor  Standing balance comment requires support of RW and physical assist to maintain standing. verbal cues to straighten L knee   Restrictions  Weight Bearing Restrictions No  Vision- Assessment  Vision Assessment? Yes  Eye Alignment [facility]  Ocular Range of Motion Restricted on the right;Restricted looking up;Impaired-to be further tested in  functional context  Alignment/Gaze Preference WDL  Tracking/Visual Pursuits Decreased smoothness of horizontal tracking;Decreased smoothness of vertical tracking;Impaired - to be further tested in functional context  Saccades Additional eye shifts occurred during testing;Decreased speed of saccadic movement;Impaired -  to be further tested in functional context  Visual Fields No apparent deficits  Depth Perception Overshoots  Additional Comments Poor visual attention; Difficulty sustaining eyes open for more than 15 seconds; requires cues to keep eyes open; pt comlains of "blurred" vision and is unable to read normal font. Appears to have a dysconjugate gaze with "nystagmus; R eye appears to have difficulty abducting; pt denies double vision; will further assess  Transfers  Overall transfer level Needs assistance  Equipment used Rolling walker (2 wheeled)  Transfers Sit to/from Stand  Sit to Stand Mod assist;+2 physical assistance  General transfer comment bed elevated, max multimodal cues, freq v/c's to maintain eyes opened, pt only able to maintain eyes opened for <15sec, modAx2 to power up and steady during transition of hands from bed to RW, wide base of support  General Comments  General comments (skin integrity, edema, etc.) wife present for session; SBP 140s initially  Exercises  Exercises Other exercises  Other Exercises  Other Exercises encouraged pt to have pt complete activities which require 2 hand, i.e folding towels, "handclapping sequencing activities"  OT - End of Session  Equipment Utilized During Treatment Gait belt;Rolling walker  Activity Tolerance Patient tolerated treatment well  Patient left in chair;with call bell/phone within reach;with chair alarm set;with family/visitor present  Nurse Communication Mobility status  OT Assessment/Plan  OT Plan Discharge plan remains appropriate  OT Visit Diagnosis Unsteadiness on feet (R26.81);Other abnormalities of gait and  mobility (R26.89);Low vision, both eyes (H54.2);Other symptoms and signs involving cognitive function  OT Frequency (ACUTE ONLY) Min 2X/week  Recommendations for Other Services Rehab consult  Follow Up Recommendations CIR  OT Equipment Other (comment) (TBA)  AM-PAC OT "6 Clicks" Daily Activity Outcome Measure (Version 2)  Help from another person eating meals? 3  Help from another person taking care of personal grooming? 3  Help from another person toileting, which includes using toliet, bedpan, or urinal? 2  Help from another person bathing (including washing, rinsing, drying)? 2  Help from another person to put on and taking off regular upper body clothing? 3  Help from another person to put on and taking off regular lower body clothing? 2  6 Click Score 15  OT Goal Progression  Progress towards OT goals Progressing toward goals  Acute Rehab OT Goals  Patient Stated Goal to be more independentq  OT Goal Formulation With patient  Time For Goal Achievement 10/05/19  Potential to Achieve Goals Good  ADL Goals  Pt Will Perform Upper Body Dressing with supervision;sitting  Pt Will Perform Lower Body Dressing with supervision;with adaptive equipment;sit to/from stand  Pt Will Transfer to Toilet with supervision;ambulating;regular height toilet  Pt Will Perform Tub/Shower Transfer Shower transfer;with modified independence  OT Time Calculation  OT Start Time (ACUTE ONLY) 1149  OT Stop Time (ACUTE ONLY) 1232  OT Time Calculation (min) 43 min  OT General Charges  $OT Visit 1 Visit  OT Treatments  $Self Care/Home Management  23-37 mins  Maurie Boettcher, OT/L   Acute OT Clinical Specialist [place] Pager [phone] Office [phone]

## 2019-09-24 NOTE — Progress Notes (Signed)
IR requested by Burnetta Sabin, NP for possible image-guided IVCF placement.  Case/images have been reviewed by Dr. Kathlene Cote who approves procedure. Plan for image-guided IVCF placement tentatively for tomorrow 09/25/2019 in IR pending scheduling. Patient will be NPO at midnight. IR PA/NP to see patient for consent tomorrow prior to procedure.  Please call IR with questions/concerns.   Bea Graff Ranson Belluomini, PA-C 09/24/2019, 4:26 PM

## 2019-09-24 NOTE — Progress Notes (Addendum)
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 Karenann Cai. Patient laying in bed resting comfortably. He is lethargic- opens eyes to voice and follows simple commands, however quickly falls back asleep. No complaints- denies headache or N/V. CT head ordered this AM due to lethargy- wife states that patient took an oral BP medication years ago and was this lethargic, so they "switched him to a patch"- chart check ?clonidine. Still with LLE swelling.  CT head this AM: 1. Evolving nonhemorrhagic cerebellar infarcts, right greater than left. 2. Liquid embolic material within the right PICA. 3. Progressive mass effect with effacement of the sulci in the posterior fossa and supratentorial structures. 4. Slight increase in size of the frontal horns of the lateral ventricles and third ventricle. Asymmetry of the temporal horns is stable. Question slight hydrocephalus. 5. No new infarcts.   Allergies: Codeine and Shellfish allergy  Medications: Prior to Admission medications   Medication Sig Start Date End Date Taking? Authorizing Provider  amLODipine (NORVASC) 10 MG tablet Take 1 tablet (10 mg total) by mouth daily. 07/04/16  Yes Rinehuls, Early Chars, PA-C  cabergoline (DOSTINEX) 0.5 MG tablet Take 0.5 mg by mouth 2 (two) times a week. Saturday and wednesday 06/24/16  Yes [provider]  cloNIDine (CATAPRES - DOSED IN MG/24 HR) 0.3 mg/24hr patch Place 0.3 mg onto the skin every 7 (seven) days. Saturday 03/12/18  Yes [provider]  hydrALAZINE (APRESOLINE) 100 MG tablet Take 1 tablet (100 mg total) by mouth every 8 (eight) hours. Patient taking  differently: Take 100 mg by mouth 3 (three) times daily.  07/03/16  Yes Rinehuls, Early Chars, PA-C  levothyroxine (SYNTHROID, LEVOTHROID) 100 MCG tablet Take 100 mcg by mouth daily. 02/22/18  Yes [provider]  losartan (COZAAR) 100 MG tablet Take 100 mg by mouth daily. 03/20/18  Yes [provider]  cloNIDine (CATAPRES) 0.1 MG tablet One tablet three times daily Patient not taking: Reported on 04/13/2018 07/03/16   Rinehuls, Early Chars, PA-C  lisinopril (PRINIVIL,ZESTRIL) 20 MG tablet Take 1 tablet (20 mg total) by mouth 2 (two) times daily. Patient not taking: Reported on 04/13/2018 07/03/16   Rinehuls, Early Chars, PA-C     Vital Signs: BP (!) 165/79   Pulse 64   Temp 98.1 F (36.7 C)   Resp 15   Ht 5\' 11"  (1.803 m)   Wt (!) 418 lb 6.9 oz (189.8 kg)   SpO2 100%   BMI 58.36 kg/m   Physical Exam Vitals and nursing note reviewed.  Constitutional:      General: He is not in acute distress.    Comments: Lethargic but arousable.  Pulmonary:     Effort: Pulmonary effort is normal. No respiratory distress.  Skin:    General: Skin is warm and dry.  Neurological:     Comments: Lethargic but arousable. Follows simple commands. Speech and comprehension intact. PERRL bilaterally. Left EOMs intact, right eye crosses midline but incomplete cross on right lateral gaze. No facial asymmetry. Tongue midline. Can spontaneously move all extremities. Facial sensation equal bilaterally. No pronator drift. Fine motor and coordination intact and symmetric.     Imaging: CT HEAD WO CONTRAST  Result Date: 09/24/2019 CLINICAL DATA:  Stroke.  Follow-up.  Lethargy. EXAM: CT HEAD WITHOUT CONTRAST TECHNIQUE: Contiguous axial images were obtained from the base of the skull through the vertex without intravenous contrast. COMPARISON:  MR head without contrast 09/20/2019. CT head without contrast 09/20/2019. FINDINGS: Brain: Evolving nonhemorrhagic cerebellar infarcts are again noted, right  greater than left. Liquid embolic agent within the right PICA again noted. Previously noted intraventricular hemorrhage has resolved. Progressive mass effect is noted. The frontal horns of the lateral ventricles are slightly more prominent than on the prior study. Asymmetry of the temporal horns is stable. Third ventricle is slightly more prominent. No new infarcts are present. Persistent generalized mass effect is present with effacement of the sulci. Vascular: Atherosclerotic changes are present within the cavernous internal carotid arteries without a hyperdense vessel. Liquid embolic material is noted as above. Skull: Calvarium is intact. No focal lytic or blastic lesions are present. No significant extracranial soft tissue lesion is present. Sinuses/Orbits: The paranasal sinuses and mastoid air cells are clear. The globes and orbits are within normal limits. IMPRESSION: 1. Evolving nonhemorrhagic cerebellar infarcts, right greater than left. 2. Liquid embolic material within the right PICA. 3. Progressive mass effect with effacement of the sulci in the posterior fossa and supratentorial structures. 4. Slight increase in size of the frontal horns of the lateral ventricles and third ventricle. Asymmetry of the temporal horns is stable. Question slight hydrocephalus. 5. No new infarcts. Electronically Signed   By: San Morelle M.D.   On: 09/24/2019 11:26   MR BRAIN WO CONTRAST  Result Date: 09/20/2019 CLINICAL DATA:  Stroke follow-up. EXAM: MRI HEAD WITHOUT CONTRAST TECHNIQUE: Multiplanar, multiecho pulse sequences of the brain and surrounding structures were obtained without intravenous contrast. COMPARISON:  Head CT performed on September 20, 2019 at 6:48 a.m. MRI of the brain June 28, 2016. FINDINGS: Brain: Area of restricted diffusion is seen involving predominantly the right PICA territory with extension to the medial aspect of the left inferior left cerebellar hemisphere, in a similar extension as  compared to recent head CT. No brainstem involvement. No hydrocephalus. Susceptibility artifact within the fourth ventricles and in the dependent portion of the bilateral occipital horns represent known intraventricular hemorrhage. More linear appearing susceptibility artifact in the fourth ventricle/inferior aspect of the cerebellar hemisphere corresponds to liquid embolic cast. Mild increase FLAIR signal within the cerebral sulci may reflect minimal residual subarachnoid hemorrhage from blood redistribution. Unchanged small remote right putaminal infarct and few scattered white matter foci of T2 hyperintensity. Enlarged sella with erosion of the clivus with predominantly T2 hyperintense solid cystic mass lesion and anterior displacement of the right carotid siphon which is encased by the mass, without stenosis. Compared to prior study, the lesion appear to have increased T2 signal with increased cystic component. Vascular: Normal flow voids, noting that the posteroinferior cerebellar artery is too small to be accurately evaluated with this technique. Skull and upper cervical spine: Normal marrow signal. Sinuses/Orbits: Mucous retention cyst in the left maxillary sinus. Mild mucosal thickening of the ethmoid cells. Other: Small amount of fluid in the right mastoid cells. Decreased diameter of the nasopharynx which may be associated with obstructive sleep apnea. IMPRESSION: Evolving bilateral PICA territory infarct, similar in extension to prior CT. There is no hydrocephalus. These results were called by telephone at the time of interpretation on 09/20/2019 at 1:38 pm to provider [facility] , who verbally acknowledged these results. Electronically Signed   By: Pedro Earls M.D.   On: 09/20/2019 13:38  Labs:  CBC: Recent Labs    09/20/19 1049 09/21/19 0511 09/22/19 0355 09/23/19 1251  WBC 14.6* 14.4* 13.5* 11.5*  HGB 12.0* 12.3* 13.3 12.2*  HCT 35.9* 36.9* 39.3 35.6*  PLT 237 271 268  225    COAGS: Recent Labs    09/19/19 0515  INR 1.0    BMP: Recent Labs    09/20/19 1049 09/21/19 0511 09/22/19 0355 09/23/19 1251  NA 137 133* 133* 131*  K 3.8 4.0 4.3 4.1  CL 106 101 98 96*  CO2 23 24 23 25   GLUCOSE 136* 136* 128* 115*  BUN 17 17 15 17   CALCIUM 8.9 8.8* 9.0 8.8*  CREATININE 0.88 0.87 0.84 0.80  GFRNONAA >60 >60 >60 >60  GFRAA >60 >60 >60 >60     Assessment and Plan:  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 Karenann Cai. Dr. Karenann Cai at bedside, discussed recent CT head with patient/wife which is stable. Patient's condition stable- more lethargic today but neurologically intact, asymptomatic at this time. For D/C to CIR pending bed assignment; for lower extremity US to R/O DVT today due to LLE swelling. Continuetaking Aspirin 325 mg once daily. Plan for follow-up with CTA head/neck (with contrast) followed by consult with Dr. Karenann Cai 3 months after discharge- our schedulers to call patient to set up this appointment. Further plans per neurology- appreciate and agree with management. NIR to follow.   Electronically Signed: Earley Abide, PA-C 09/24/2019, 11:42 AM   I spent a total of 25 Minutes at the the patient's bedside AND on the patient's hospital floor or unit, greater than 50% of which was counseling/coordinating care for dissecting right PICA aneurysm s/p embolization.

## 2019-09-25 ENCOUNTER — Other Ambulatory Visit (HOSPITAL_COMMUNITY): Payer: Self-pay | Admitting: Interventional Radiology

## 2019-09-25 ENCOUNTER — Inpatient Hospital Stay (HOSPITAL_COMMUNITY): Payer: 59

## 2019-09-25 DIAGNOSIS — Z95828 Presence of other vascular implants and grafts: Secondary | ICD-10-CM

## 2019-09-25 HISTORY — PX: IR IVC FILTER PLMT / S&I /IMG GUID/MOD SED: IMG701

## 2019-09-25 MED ORDER — MIDAZOLAM HCL 2 MG/2ML IJ SOLN
INTRAMUSCULAR | Status: AC
  Filled 2019-09-25: qty 4

## 2019-09-25 MED ORDER — FENTANYL CITRATE (PF) 100 MCG/2ML IJ SOLN
INTRAMUSCULAR | Status: AC | PRN
  Administered 2019-09-25: 12.5 ug via INTRAVENOUS

## 2019-09-25 MED ORDER — LIDOCAINE HCL 1 % IJ SOLN
INTRAMUSCULAR | Status: AC
  Filled 2019-09-25: qty 20

## 2019-09-25 MED ORDER — MIDAZOLAM HCL 2 MG/2ML IJ SOLN
INTRAMUSCULAR | Status: AC | PRN
  Administered 2019-09-25: 0.5 mg via INTRAVENOUS

## 2019-09-25 MED ORDER — BISACODYL 10 MG RE SUPP
10.0000 mg | Freq: Once | RECTAL | Status: AC
  Administered 2019-09-25: 10 mg via RECTAL
  Filled 2019-09-25: qty 1

## 2019-09-25 MED ORDER — POLYETHYLENE GLYCOL 3350 17 G PO PACK
17.0000 g | PACK | Freq: Every day | ORAL | Status: DC
  Administered 2019-09-25: 17 g via ORAL
  Filled 2019-09-25: qty 1

## 2019-09-25 MED ORDER — SPIRONOLACTONE 25 MG PO TABS
25.0000 mg | ORAL_TABLET | Freq: Every day | ORAL | Status: DC
  Administered 2019-09-25: 25 mg via ORAL
  Filled 2019-09-25 (×2): qty 1

## 2019-09-25 MED ORDER — FENTANYL CITRATE (PF) 100 MCG/2ML IJ SOLN
INTRAMUSCULAR | Status: AC
  Filled 2019-09-25: qty 4

## 2019-09-25 MED ORDER — IOHEXOL 300 MG/ML  SOLN
100.0000 mL | Freq: Once | INTRAMUSCULAR | Status: AC | PRN
  Administered 2019-09-25: 85 mL via INTRAVENOUS

## 2019-09-25 MED ORDER — LIDOCAINE HCL (PF) 1 % IJ SOLN
INTRAMUSCULAR | Status: AC | PRN
  Administered 2019-09-25: 10 mL

## 2019-09-25 NOTE — Progress Notes (Signed)
STROKE TEAM PROGRESS NOTE   INTERVAL HISTORY  His wife is at the bedside..Follow-up CT scan of the patient blood pressure yet remains high requiring him to be back on Cleviprex drip.  I reviewed his prior hospitalization for intracerebral hemorrhage and hypertensive urgency at [facility] and discuss medication management with pharmacy and plan to add spironolactone and hopefully get him off Cleviprex drip today.  He seems more alert and interactive today.  Repeat CT scan head yesterday showed evolving cerebellar infarcts with slight mass-effect and dilatation of the frontal horns and third ventricle and questionable hydrocephalus..  Lower extremity venous Doppler showed DVT in the left leg.  Patient is having IVC filter placed by IR today as he is not a candidate for anticoagulation given intracranial bleeding. Vitals:   09/25/19 1230 09/25/19 1300 09/25/19 1400 09/25/19 1504  BP: (!) 159/75 (!) 179/70 (!) 143/66 123/65  Pulse: 71 (!) 58 69 69  Resp: 10 15 11 12   Temp:      TempSrc:      SpO2: 97% 95% 100% 100%  Weight:      Height:       CBC:  Recent Labs  Lab 09/22/19 0355 09/23/19 1251  WBC 13.5* 11.5*  HGB 13.3 12.2*  HCT 39.3 35.6*  MCV 87.7 85.8  PLT 268 784   Basic Metabolic Panel:  Recent Labs  Lab 09/22/19 0355 09/23/19 1251  NA 133* 131*  K 4.3 4.1  CL 98 96*  CO2 23 25  GLUCOSE 128* 115*  BUN 15 17  CREATININE 0.84 0.80  CALCIUM 9.0 8.8*   Lipid Panel:     Component Value Date/Time   CHOL 159 09/19/2019 0515   TRIG 100 09/19/2019 0515   HDL 35 (L) 09/19/2019 0515   CHOLHDL 4.5 09/19/2019 0515   VLDL 20 09/19/2019 0515   LDLCALC 104 (H) 09/19/2019 0515   HgbA1c:  Lab Results  Component Value Date   HGBA1C 4.7 (L) 09/18/2019   Urine Drug Screen:     Component Value Date/Time   LABOPIA POSITIVE (A) 09/17/2019 2126   COCAINSCRNUR NONE DETECTED 09/17/2019 2126   LABBENZ NONE DETECTED 09/17/2019 2126   AMPHETMU NONE DETECTED 09/17/2019 2126   THCU  NONE DETECTED 09/17/2019 2126   LABBARB NONE DETECTED 09/17/2019 2126    Alcohol Level No results found for: ETH  IMAGING past 24 hours IR IVC FILTER PLMT / S&I Burke Keels GUID/MOD SED  Result Date: 09/25/2019 CLINICAL DATA:  Lower extremity DVT. Recurrent acute intracranial hemorrhage, a relative contraindication to anticoagulation. Caval filtration requested. EXAM: INFERIOR VENACAVOGRAM IVC FILTER PLACEMENT UNDER FLUOROSCOPY FLUOROSCOPY TIME:  96 seconds; 258 mGy TECHNIQUE: The procedure, risks (including but not limited to bleeding, infection, organ damage ), benefits, and alternatives were explained to the patient. Questions regarding the procedure were encouraged and answered. The patient understands and consents to the procedure. Patency of the right IJ vein was confirmed with ultrasound with image documentation. An appropriate skin site was determined. Skin site was marked, prepped with chlorhexidine, and draped using maximum barrier technique. The region was infiltrated locally with 1% lidocaine. Intravenous Fentanyl 12.52mcg and Versed 0.5mg  were administered as conscious sedation during continuous monitoring of the patient's level of consciousness and physiological / cardiorespiratory status by the radiology RN, with a total moderate sedation time of 14 minutes. Under real-time ultrasound guidance, the right IJ vein was accessed with a 21 gauge micropuncture needle; the needle tip within the vein was confirmed with ultrasound image documentation. The needle  was exchanged over a 018 guidewire for a transitional dilator, which allow advancement of the [facility] wire into the IVC. A long 6 French vascular sheath was placed for inferior venacavography. This demonstrated no caval thrombus. Renal vein inflows were evident. The [facility] IVC filter was advanced through the sheath and successfully deployed under fluoroscopy at the L2 level. Followup cavagram demonstrates stable filter position and no evident  complication. The sheath was removed and hemostasis achieved at the site. No immediate complication. IMPRESSION: 1. Normal IVC. No thrombus or significant anatomic variation. 2. Technically successful infrarenal IVC filter placement. This is a retrievable model. PLAN: This IVC filter is potentially retrievable. The patient will be assessed for filter retrieval by Interventional Radiology in approximately 8-12 weeks. Further recommendations regarding filter retrieval, continued surveillance or declaration of device permanence, will be made at that time. Electronically Signed   By: Lucrezia Europe M.D.   On: 09/25/2019 13:21   VAS [place] LOWER EXTREMITY VENOUS (DVT)  Result Date: 09/24/2019  Lower Venous DVTStudy Indications: Stroke.  Limitations: Body habitus. Performing Technologist: June Leap RDMS, RVT  Examination Guidelines: A complete evaluation includes B-mode imaging, spectral Doppler, color Doppler, and power Doppler as needed of all accessible portions of each vessel. Bilateral testing is considered an integral part of a complete examination. Limited examinations for reoccurring indications may be performed as noted. The reflux portion of the exam is performed with the patient in reverse Trendelenburg.  +---------+---------------+---------+-----------+----------+--------------+ RIGHT    CompressibilityPhasicitySpontaneityPropertiesThrombus Aging +---------+---------------+---------+-----------+----------+--------------+ CFV      Full           Yes      Yes                                 +---------+---------------+---------+-----------+----------+--------------+ SFJ      Full                                                        +---------+---------------+---------+-----------+----------+--------------+ FV Prox  Full                                                        +---------+---------------+---------+-----------+----------+--------------+ FV Mid   Full                                                         +---------+---------------+---------+-----------+----------+--------------+ FV DistalFull                                                        +---------+---------------+---------+-----------+----------+--------------+ PFV      Full                                                        +---------+---------------+---------+-----------+----------+--------------+  POP      Full           Yes      Yes                                 +---------+---------------+---------+-----------+----------+--------------+ PTV      Full                                                        +---------+---------------+---------+-----------+----------+--------------+ PERO     Full                                                        +---------+---------------+---------+-----------+----------+--------------+   +---------+---------------+---------+-----------+----------+--------------+ LEFT     CompressibilityPhasicitySpontaneityPropertiesThrombus Aging +---------+---------------+---------+-----------+----------+--------------+ CFV      Full           Yes      Yes                                 +---------+---------------+---------+-----------+----------+--------------+ SFJ      Full                                                        +---------+---------------+---------+-----------+----------+--------------+ FV Prox  Full                                                        +---------+---------------+---------+-----------+----------+--------------+ FV Mid   Full                                                        +---------+---------------+---------+-----------+----------+--------------+ FV DistalFull                                                        +---------+---------------+---------+-----------+----------+--------------+ PFV      Full                                                         +---------+---------------+---------+-----------+----------+--------------+ POP      Full           Yes      Yes                                 +---------+---------------+---------+-----------+----------+--------------+  PTV      None                                         Acute          +---------+---------------+---------+-----------+----------+--------------+ PERO     Full                                                        +---------+---------------+---------+-----------+----------+--------------+     Summary: RIGHT: - There is no evidence of deep vein thrombosis in the lower extremity.  LEFT: - Findings consistent with acute deep vein thrombosis involving the left posterior tibial veins.  *See table(s) above for measurements and observations. Electronically signed by Deitra Mayo MD on 09/24/2019 at 4:05:02 PM.    Final      PHYSICAL EXAM   General -morbidly obese middle-aged Caucasian male well developed, not in acute distress.  Ophthalmologic - fundi not visualized due to noncooperation.  Cardiovascular - Regular rhythm and rate.  Neuro - awake alert and oriented to time place and person.. No aphasia, but paucity of speech, no dysarthria. Able to name and repeat. PERRL, EOMI, no nystagmus on bilateral gaze but saccadic dysmetria on left lateral gaze..  Facial symmetrical. visual field full. Tongue midline. Moving all extremities equally but lower extremities less than upper extremities. B/l FTN intact no ataxia. Sensation intact, gait not tested.    ASSESSMENT/PLAN [name] is a 54 y.o. 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 SBP > 190.  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 small volume IVH in 4th ventricle. Interval resection previous central skull base infiltrative mass. 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 No hydrocephalus. Known sellar mass w/ skull base erosion.   Cerebral angio right PICA 80mm aneurysm   MRI - Evolving bilateral PICA territory infarct, similar in extension to prior CT. There is no hydrocephalus.   Repeat CT head 6/8 /21 evolving nonhemorrhagic cerebral infarcts right greater than left*   2D Echo EF 55-60%. No source of embolus. LA moderate dilated  LDL 104  HgbA1c 4.7  UDS + opiates  SCDs for VTE prophylaxis  No antithrombotic prior to admission, was on No antithrombotic given hemorrhage. Post aneurysm embolization, now off heparin IV, on ASA 325.   Therapy recommendations:  CIR  Disposition:  Pending  Right PICA aneurysm  Cerebral angiogram showed right PICA 5 mm aneurysm  Aneurysm management with right PICA embolization 6/3  Expecting small right cerebellar infarct  CT bilateral cerebellar infarct, right more than left  MRI confirmed bilateral PICA infarcts, right more than left.  No hydrocephalus  Off heparin IV, on aspirin 325  History of IVH  2018 - admitted for headache, MRI and CT showed IVH involving the 4th ventricle, and old right BG infarct.  Carotid Doppler  negative.  EF 55 to 60%.  LDL 27 and A1c 4.6.  CTA head at that time showed right PICA patent.  Prolactinoma    Followed by Dr. Maurie Boettcher and endocrinologist at [facility]  On cabergoline therapy since March 2018 with decrease in mass size on follow-up imaging  Being followed by surveillance with MRIs  Cabergoline every Wed and Sat  Hypertensive Emergency  SBP > 190 on arrival   Home meds:  amlodipine 10, hydralazine 100 q8, losartan 100  Now still  on cleviprex   Resumed home medication  Add HCTZ 50 and clonidine 0.1 tid -> 0.2 tid . SBP goal <160 (SBP 141 - 160 today) . Long-term BP goal normotensive  Hyperlipidemia  LDL 104  on no statin PTA  Add lipitor 40 in the setting of stroke  Continue statin on discharge.  Other Stroke Risk Factors  Morbid Obesity, Body mass index is 58.36 kg/m., recommend weight loss, diet and exercise as appropriate   Obstructive sleep apnea  Other Active Problems  Hypothyroid on synthroid  Leukocytosis - 14.6->14.4 (afebrile)->13.5  Mild hyponatremia - 133->133  Hospital day # 8  [name]. Continue clinidine patch, HCTZ and add spironolactone.  and hopefully get him off the Cleviprex drip.  IVC filter placement by IR today.Long discussion with patient and wife at the bedside and answered questions. This patient is critically ill and at significant risk of neurological worsening, death and care requires constant monitoring of vital signs, hemodynamics,respiratory and cardiac monitoring, extensive review of multiple databases, frequent neurological assessment, discussion with family, other specialists and medical decision making of high complexity.I have made any additions or clarifications directly to the above note.This critical care time does not reflect procedure time, or teaching time or supervisory time of PA/NP/Med Resident etc but could involve care discussion time.  I spent 30 minutes of neurocritical care time  in the care of  this patient.      Antony Contras, MD Medical Director [place] Pager: [phone] 09/25/2019 3:19 PM  To contact Stroke Continuity provider, please refer to [email or web address]  After hours, contact General Neurology

## 2019-09-25 NOTE — Progress Notes (Signed)
Pt placed on cpap 

## 2019-09-25 NOTE — Progress Notes (Signed)
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 Karenann Cai. Patient in IR on IR table prior to IVCF placement. He is lethargic- opens eyes to voice and follows simple commands, however quickly falls back asleep. No complaints- denies headache or N/V. VAS [place] bilateral lower extremities 09/24/2019 revealed LLE acute DVT.   Allergies: Codeine and Shellfish allergy  Medications: Prior to Admission medications   Medication Sig Start Date End Date Taking? Authorizing Provider  amLODipine (NORVASC) 10 MG tablet Take 1 tablet (10 mg total) by mouth daily. 07/04/16  Yes Rinehuls, Early Chars, PA-C  cabergoline (DOSTINEX) 0.5 MG tablet Take 0.5 mg by mouth 2 (two) times a week. Saturday and wednesday 06/24/16  Yes [provider]  cloNIDine (CATAPRES - DOSED IN MG/24 HR) 0.3 mg/24hr patch Place 0.3 mg onto the skin every 7 (seven) days. Saturday 03/12/18  Yes [provider]  hydrALAZINE (APRESOLINE) 100 MG tablet Take 1 tablet (100 mg total) by mouth every 8 (eight) hours. Patient taking differently: Take 100 mg by mouth 3 (three) times daily.  07/03/16  Yes Rinehuls, Early Chars, PA-C  levothyroxine (SYNTHROID, LEVOTHROID) 100 MCG tablet Take 100 mcg by mouth daily. 02/22/18  Yes [provider]  losartan (COZAAR) 100 MG tablet Take 100 mg by mouth daily. 03/20/18  Yes [provider]  cloNIDine (CATAPRES) 0.1 MG tablet One tablet three times daily Patient not taking: Reported on 04/13/2018 07/03/16   Rinehuls, Early Chars, PA-C  lisinopril (PRINIVIL,ZESTRIL) 20 MG tablet Take 1 tablet (20 mg total) by mouth 2 (two) times daily. Patient not taking: Reported on  04/13/2018 07/03/16   Rinehuls, Early Chars, PA-C     Vital Signs: BP (!) 150/65   Pulse 64   Temp 99 F (37.2 C) (Axillary)   Resp 14   Ht 5\' 11"  (1.803 m)   Wt (!) 418 lb 6.9 oz (189.8 kg)   SpO2 97%   BMI 58.36 kg/m   Physical Exam Vitals and nursing note reviewed.  Constitutional:      General: He is not in acute distress. Pulmonary:     Effort: Pulmonary effort is normal. No respiratory distress.  Skin:    General: Skin is warm and dry.  Neurological:     Comments: Lethargic but arousable. Follows simple commands. Speech and comprehension intact. PERRL bilaterally. EOMs intact bilaterally without nystagmus or subjective diplopia. No facial asymmetry. Tongue midline. Can spontaneously move all extremities. No pronator drift.     Imaging: CT HEAD WO CONTRAST  Result Date: 09/24/2019 CLINICAL DATA:  Stroke.  Follow-up.  Lethargy. EXAM: CT HEAD WITHOUT CONTRAST TECHNIQUE: Contiguous axial images were obtained from the base of the skull through the vertex without intravenous contrast. COMPARISON:  MR head without contrast 09/20/2019. CT head without contrast 09/20/2019. 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 Liquid embolic agent within the right PICA again noted. Previously noted intraventricular hemorrhage has resolved. Progressive mass effect is noted. The frontal horns of the lateral ventricles are slightly more prominent than on the prior study. Asymmetry of the temporal horns is stable. Third ventricle is slightly more prominent. No new infarcts are present. Persistent generalized mass effect is present with effacement of  the sulci. Vascular: Atherosclerotic changes are present within the cavernous internal carotid arteries without a hyperdense vessel. Liquid embolic material is noted as above. Skull: Calvarium is intact. No focal lytic or blastic lesions are present. No significant extracranial soft tissue lesion  is present. Sinuses/Orbits: The paranasal sinuses and mastoid air cells are clear. The globes and orbits are within normal limits. IMPRESSION: 1. Evolving nonhemorrhagic cerebellar infarcts, right greater than left. 2. Liquid embolic material within the right PICA. 3. Progressive mass effect with effacement of the sulci in the posterior fossa and supratentorial structures. 4. Slight increase in size of the frontal horns of the lateral ventricles and third ventricle. Asymmetry of the temporal horns is stable. Question slight hydrocephalus. 5. No new infarcts. Electronically Signed   By: San Morelle M.D.   On: 09/24/2019 11:26   VAS [place] LOWER EXTREMITY VENOUS (DVT)  Result Date: 09/24/2019  Lower Venous DVTStudy Indications: Stroke.  Limitations: Body habitus. Performing Technologist: June Leap RDMS, RVT  Examination Guidelines: A complete evaluation includes B-mode imaging, spectral Doppler, color Doppler, and power Doppler as needed of all accessible portions of each vessel. Bilateral testing is considered an integral part of a complete examination. Limited examinations for reoccurring indications may be performed as noted. The reflux portion of the exam is performed with the patient in reverse Trendelenburg.  +---------+---------------+---------+-----------+----------+--------------+ RIGHT    CompressibilityPhasicitySpontaneityPropertiesThrombus Aging +---------+---------------+---------+-----------+----------+--------------+ CFV      Full           Yes      Yes                                 +---------+---------------+---------+-----------+----------+--------------+ SFJ      Full                                                        +---------+---------------+---------+-----------+----------+--------------+ FV Prox  Full                                                        +---------+---------------+---------+-----------+----------+--------------+ FV Mid   Full                                                         +---------+---------------+---------+-----------+----------+--------------+ FV DistalFull                                                        +---------+---------------+---------+-----------+----------+--------------+ PFV      Full                                                        +---------+---------------+---------+-----------+----------+--------------+  POP      Full           Yes      Yes                                 +---------+---------------+---------+-----------+----------+--------------+ PTV      Full                                                        +---------+---------------+---------+-----------+----------+--------------+ PERO     Full                                                        +---------+---------------+---------+-----------+----------+--------------+   +---------+---------------+---------+-----------+----------+--------------+ LEFT     CompressibilityPhasicitySpontaneityPropertiesThrombus Aging +---------+---------------+---------+-----------+----------+--------------+ CFV      Full           Yes      Yes                                 +---------+---------------+---------+-----------+----------+--------------+ SFJ      Full                                                        +---------+---------------+---------+-----------+----------+--------------+ FV Prox  Full                                                        +---------+---------------+---------+-----------+----------+--------------+ FV Mid   Full                                                        +---------+---------------+---------+-----------+----------+--------------+ FV DistalFull                                                        +---------+---------------+---------+-----------+----------+--------------+ PFV      Full                                                         +---------+---------------+---------+-----------+----------+--------------+ POP      Full           Yes      Yes                                 +---------+---------------+---------+-----------+----------+--------------+  PTV      None                                         Acute          +---------+---------------+---------+-----------+----------+--------------+ PERO     Full                                                        +---------+---------------+---------+-----------+----------+--------------+     Summary: RIGHT: - There is no evidence of deep vein thrombosis in the lower extremity.  LEFT: - Findings consistent with acute deep vein thrombosis involving the left posterior tibial veins.  *See table(s) above for measurements and observations. Electronically signed by Deitra Mayo MD on 09/24/2019 at 4:05:02 PM.    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 Karenann Cai. Patient's condition stable- still lethargic but neurologically intact,asymptomatic at this time. For D/C to CIR pending bed assignment; for IVCF today in IR due to acute DVT. Continuetaking Aspirin 325 mg once daily. Plan for  follow-up with CTA head/neck (with contrast) followed by consult with Dr. Karenann Cai 3 months after discharge- our schedulers to call patient to set up this appointment. Further plans per neurology- appreciate and agree with management. NIR to follow.   Electronically Signed: Earley Abide, PA-C 09/25/2019, 12:16 PM   I spent a total of 25 Minutes at the the patient's bedside AND on the patient's hospital floor or unit, greater than 50% of which was counseling/coordinating care for dissecting right PICA aneurysm s/p embolization.

## 2019-09-25 NOTE — Progress Notes (Signed)
Physical Therapy Treatment Patient Details Name: Andrew Perez MRN: [id number] DOB: Oct 31, 1965 Today's Date: 09/25/2019    History of Present Illness 54 yo male admitted with IVH of 4th ventricle  PMH HTN  morbid obese previous IVH 2018. 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     PT Comments    Pt participates In PT session on this date but is somewhat self-limiting, refusing further gait training and mobility after transferring out of bed to recliner. Pt reports he does not want to push it after his recent IVC filter placement today. Pt continues to remain at a high falls risk due to L weakness and imbalance. Pt remains with slowed cognition and processing, intermittently falling asleep during session. Pt will continue to benefit from acute PT POC to improve activity tolerance, gait quality, and to reduce falls risk. PT continues to recommend CIR at this time as the patient was independent prior to admission and demonstrates the potential to return to a modI level.   Follow Up Recommendations  CIR     Equipment Recommendations  (bariatric RW)    Recommendations for Other Services       Precautions / Restrictions Precautions Precautions: Fall;Other (comment)(obesity, vision deficits) Precaution Comments: L sided weekness, obese, bilat LE edema-wears compression stocking Restrictions Weight Bearing Restrictions: No    Mobility  Bed Mobility Overal bed mobility: Needs Assistance Bed Mobility: Supine to Sit     Supine to sit: Min guard;HOB elevated        Transfers Overall transfer level: Needs assistance Equipment used: Rolling walker (2 wheeled)(bariatric RW) Transfers: Sit to/from Stand Sit to Stand: Min assist;From elevated surface(modA stand to sit)         General transfer comment: pt requires cues for hand placement  Ambulation/Gait Ambulation/Gait assistance: Min assist Gait  Distance (Feet): 5 Feet Assistive device: Rolling walker (2 wheeled)(bariatric RW) Gait Pattern/deviations: Step-to pattern Gait velocity: reduced Gait velocity interpretation: <1.8 ft/sec, indicate of risk for recurrent falls General Gait Details: pt with short step to gait, increased L knee flexion during stance phase although no buckling this session. PT provides cues for short steps when backing up to recliner and attempting to place toes on ground first   Stairs             Wheelchair Mobility    Modified Rankin (Stroke Patients Only) Modified Rankin (Stroke Patients Only) Pre-Morbid Rankin Score: No symptoms Modified Rankin: Moderately severe disability     Balance Overall balance assessment: Needs assistance Sitting-balance support: Bilateral upper extremity supported;Feet supported Sitting balance-Leahy Scale: Fair Sitting balance - Comments: minG with unilateral UE support of bed   Standing balance support: Bilateral upper extremity supported Standing balance-Leahy Scale: Poor Standing balance comment: minA for static standing balance with UE support of RW                            Cognition Arousal/Alertness: Lethargic(awakens with mobility) Behavior During Therapy: Flat affect(some laughing today but mostly flat) Overall Cognitive Status: Impaired/Different from baseline Area of Impairment: Attention;Memory;Following commands;Safety/judgement;Awareness;Problem solving                   Current Attention Level: Sustained Memory: Decreased recall of precautions;Decreased short-term memory Following Commands: Follows one step commands consistently;Follows one step commands with increased time Safety/Judgement: Decreased awareness of safety;Decreased awareness of deficits Awareness: Intellectual Problem Solving: Slow  processing;Requires verbal cues        Exercises      General Comments General comments (skin integrity, edema, etc.):  VSS on RA during session, BP steady with systolic 683M during mobility      Pertinent Vitals/Pain Pain Assessment: No/denies pain    Home Living Family/patient expects to be discharged to:: Private residence                    Prior Function            PT Goals (current goals can now be found in the care plan section) Acute Rehab PT Goals Patient Stated Goal: to be more independent Progress towards PT goals: Not progressing toward goals - comment(self-limiting this session)    Frequency    Min 4X/week      PT Plan Current plan remains appropriate    Co-evaluation              AM-PAC PT "6 Clicks" Mobility   Outcome Measure  Help needed turning from your back to your side while in a flat bed without using bedrails?: A Little Help needed moving from lying on your back to sitting on the side of a flat bed without using bedrails?: A Little Help needed moving to and from a bed to a chair (including a wheelchair)?: A Lot Help needed standing up from a chair using your arms (e.g., wheelchair or bedside chair)?: A Lot Help needed to walk in hospital room?: A Lot Help needed climbing 3-5 steps with a railing? : Total 6 Click Score: 13    End of Session   Activity Tolerance: Patient limited by fatigue(self-limiting this session) Patient left: in chair;with call bell/phone within reach;with chair alarm set Nurse Communication: Mobility status PT Visit Diagnosis: Unsteadiness on feet (R26.81);Other abnormalities of gait and mobility (R26.89);Other symptoms and signs involving the nervous system (R29.898)     Time: 1962-2297 PT Time Calculation (min) (ACUTE ONLY): 23 min  Charges:  $Gait Training: 8-22 mins $Therapeutic Activity: 8-22 mins                     Zenaida Niece, PT, DPT Acute Rehabilitation Pager: [phone]    Zenaida Niece 09/25/2019, 4:18 PM

## 2019-09-25 NOTE — Procedures (Signed)
  Procedure: IVCgram and filter placement   EBL:   minimal Complications:  none immediate  See full dictation in [employer].  Dillard Cannon MD Main # [phone] Pager  [phone]

## 2019-09-25 NOTE — Consult Note (Addendum)
Chief Complaint: Intraventricular hemorrhage and LLE DVT - IVC filter request  Referring Physician(s): Tyrone Nine NP   Supervising Physician: Arne Cleveland  Patient Status: [facility] - In-pt  History of Present Illness: Andrew Perez is a 54 y.o. 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 Presented to this facility with sudden onset of headache, dizziness and vomiting. 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 Team is requesting IVC filter placement as patient cannot currently be on anticoagulation   Past Medical History:  Diagnosis Date  . Hypertension   . IVH (intraventricular hemorrhage) ([place]) 2018   due to HTN  . Prolactinoma ([place])    followed by [facility]  . Sleep apnea     Past Surgical History:  Procedure Laterality Date  . IR 3D INDEPENDENT WKST  09/19/2019  . IR ANGIO INTRA EXTRACRAN SEL COM CAROTID INNOMINATE BILAT MOD SED  09/18/2019  . IR ANGIO VERTEBRAL SEL SUBCLAVIAN INNOMINATE UNI L MOD SED  09/18/2019  . IR ANGIO VERTEBRAL SEL VERTEBRAL UNI R MOD SED  09/18/2019  . IR ANGIO VERTEBRAL SEL VERTEBRAL UNI R MOD SED  09/19/2019  . IR ANGIOGRAM FOLLOW UP STUDY  09/19/2019  . IR CT HEAD LTD  09/19/2019  . IR NEURO EACH ADD'L AFTER BASIC UNI RIGHT (MS)  09/19/2019  . IR TRANSCATH/EMBOLIZ  09/19/2019  . IR US GUIDE VASC ACCESS RIGHT  09/18/2019  . IR US GUIDE VASC ACCESS RIGHT  09/19/2019  . RADIOLOGY WITH ANESTHESIA N/A 09/19/2019   Procedure: RADIOLOGY WITH ANESTHESIA ANUERYSM COILING;  Surgeon: Pedro Earls, MD;  Location: [place];  Service: Radiology;  Laterality: N/A;  . TONSILLECTOMY      Allergies: Codeine and Shellfish allergy  Medications: Prior to Admission medications   Medication Sig Start Date End Date Taking? Authorizing Provider  amLODipine (NORVASC) 10 MG tablet Take 1 tablet (10 mg total) by mouth daily. 07/04/16  Yes Rinehuls, Early Chars,  PA-C  cabergoline (DOSTINEX) 0.5 MG tablet Take 0.5 mg by mouth 2 (two) times a week. Saturday and wednesday 06/24/16  Yes [provider]  cloNIDine (CATAPRES - DOSED IN MG/24 HR) 0.3 mg/24hr patch Place 0.3 mg onto the skin every 7 (seven) days. Saturday 03/12/18  Yes [provider]  hydrALAZINE (APRESOLINE) 100 MG tablet Take 1 tablet (100 mg total) by mouth every 8 (eight) hours. Patient taking differently: Take 100 mg by mouth 3 (three) times daily.  07/03/16  Yes Rinehuls, Early Chars, PA-C  levothyroxine (SYNTHROID, LEVOTHROID) 100 MCG tablet Take 100 mcg by mouth daily. 02/22/18  Yes [provider]  losartan (COZAAR) 100 MG tablet Take 100 mg by mouth daily. 03/20/18  Yes [provider]  cloNIDine (CATAPRES) 0.1 MG tablet One tablet three times daily Patient not taking: Reported on 04/13/2018 07/03/16   Rinehuls, Early Chars, PA-C  lisinopril (PRINIVIL,ZESTRIL) 20 MG tablet Take 1 tablet (20 mg total) by mouth 2 (two) times daily. Patient not taking: Reported on 04/13/2018 07/03/16   Rinehuls, Early Chars, PA-C     Family History  Problem Relation Age of Onset  . Colon cancer Father     Social History   Socioeconomic History  . Marital status: Married    Spouse name: Not on file  . Number of children: Not on file  . Years of education: Not on file  . Highest education  level: Not on file  Occupational History  . Not on file  Tobacco Use  . Smoking status: Never Smoker  . Smokeless tobacco: Never Used  Substance and Sexual Activity  . Alcohol use: No  . Drug use: No  . Sexual activity: Not on file  Other Topics Concern  . Not on file  Social History Narrative  . Not on file   Social Determinants of Health   Financial Resource Strain:   . Difficulty of Paying Living Expenses:   Food Insecurity:   . Worried About Charity fundraiser in the Last Year:   . Arboriculturist in the Last Year:   Transportation Needs:   . Film/video editor  (Medical):   Marland Kitchen Lack of Transportation (Non-Medical):   Physical Activity:   . Days of Exercise per Week:   . Minutes of Exercise per Session:   Stress:   . Feeling of Stress :   Social Connections:   . Frequency of Communication with Friends and Family:   . Frequency of Social Gatherings with Friends and Family:   . Attends Religious Services:   . Active Member of Clubs or Organizations:   . Attends Archivist Meetings:   Marland Kitchen Marital Status:      Review of Systems: A 12 point ROS discussed and pertinent positives are indicated in the HPI above.  All other systems are negative.  Review of Systems  Constitutional: Negative for fever.  HENT: Negative for congestion.   Respiratory: Negative for cough and shortness of breath.   Cardiovascular: Negative for chest pain.  Gastrointestinal: Negative for abdominal pain.  Neurological: Negative for headaches.  Psychiatric/Behavioral: Negative for behavioral problems and confusion.    Vital Signs: BP (!) 150/64   Pulse 67   Temp 99.3 F (37.4 C) (Axillary)   Resp 17   Ht '5\' 11"'$  (1.803 m)   Wt (!) 418 lb 6.9 oz (189.8 kg)   SpO2 96%   BMI 58.36 kg/m   Physical Exam Vitals and nursing note reviewed.  Constitutional:      Appearance: He is well-developed.  HENT:     Head: Normocephalic.  Cardiovascular:     Rate and Rhythm: Normal rate and regular rhythm.  Pulmonary:     Effort: Pulmonary effort is normal.     Breath sounds: Normal breath sounds.  Musculoskeletal:        General: Normal range of motion.     Cervical back: Normal range of motion.  Skin:    General: Skin is dry.  Neurological:     Mental Status: He is alert and oriented to person, place, and time.     Imaging: CT HEAD WO CONTRAST  Result Date: 09/24/2019 CLINICAL DATA:  Stroke.  Follow-up.  Lethargy. EXAM: CT HEAD WITHOUT CONTRAST TECHNIQUE: Contiguous axial images were obtained from the base of the skull through the vertex without intravenous  contrast. COMPARISON:  MR head without contrast 09/20/2019. CT head without contrast 09/20/2019. 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 Liquid embolic agent within the right PICA again noted. Previously noted intraventricular hemorrhage has resolved. Progressive mass effect is noted. The frontal horns of the lateral ventricles are slightly more prominent than on the prior study. Asymmetry of the temporal horns is stable. Third ventricle is slightly more prominent. No new infarcts are present. Persistent generalized mass effect is present with effacement of the sulci. Vascular: Atherosclerotic changes are present within the cavernous  internal carotid arteries without a hyperdense vessel. Liquid embolic material is noted as above. Skull: Calvarium is intact. No focal lytic or blastic lesions are present. No significant extracranial soft tissue lesion is present. Sinuses/Orbits: The paranasal sinuses and mastoid air cells are clear. The globes and orbits are within normal limits. IMPRESSION: 1. Evolving nonhemorrhagic cerebellar infarcts, right greater than left. 2. Liquid embolic material within the right PICA. 3. Progressive mass effect with effacement of the sulci in the posterior fossa and supratentorial structures. 4. Slight increase in size of the frontal horns of the lateral ventricles and third ventricle. Asymmetry of the temporal horns is stable. Question slight hydrocephalus. 5. No new infarcts. Electronically Signed   By: San Morelle M.D.   On: 09/24/2019 11:26   CT HEAD WO CONTRAST  Result Date: 09/20/2019 CLINICAL DATA:  Stroke follow-up EXAM: CT HEAD WITHOUT CONTRAST TECHNIQUE: Contiguous axial images were obtained from the base of the skull through the vertex without intravenous contrast. 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 Onyx is seen at the level of vessel  embolization. Narrowing of the lower fourth ventricle without hydrocephalus. There is small volume intraventricular hemorrhage which is not progressed. Vascular: As above Skull: No acute or aggressive finding. Sinuses/Orbits: Right mastoid and middle ear opacification. 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 No hydrocephalus or interval hemorrhage. Electronically Signed   By: Monte Fantasia M.D.   On: 09/20/2019 07:13   CT HEAD WO CONTRAST  Result Date: 09/18/2019 CLINICAL DATA:  Follow-up subarachnoid hemorrhage EXAM: CT HEAD WITHOUT CONTRAST TECHNIQUE: Contiguous axial images were obtained from the base of the skull through the vertex without intravenous contrast. COMPARISON:  Head CT from yesterday and 06/28/2016 FINDINGS: Brain: Slight interval redistribution of small volume intraventricular hemorrhage seen at the fourth, third, and lateral ventricles. Potentially parenchymal high-density at the roof of the fourth ventricle. Fourth ventricular hemorrhage was seen in 2018 as well. Lytic and low-density skull base mass with well-defined margins and very low-density. 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 No evidence of infarct.  No hydrocephalus. Vascular: Negative by noncontrast CT Skull: Tumoral skull base erosion centered at the sella. Sinuses/Orbits: Negative IMPRESSION: 1. Allowing for redistribution, no convincing increase in the intraventricular hemorrhage. No hydrocephalus. 2. Fourth ventricular hemorrhage was also seen in 2018, recommend updated MRI/MRA to evaluate for underlying lesion. 3. Known sellar mass with skull base erosion. Electronically Signed   By: Monte Fantasia M.D.   On: 09/18/2019 04:36   CT HEAD WO CONTRAST  Result Date: 09/17/2019 CLINICAL DATA:  Headache. 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 EXAM: CT HEAD WITHOUT CONTRAST TECHNIQUE: Contiguous axial images  were obtained from the base of the skull through the vertex without intravenous contrast. COMPARISON:  Brain MR 06/28/2016.  CT 06/28/2016. FINDINGS: Brain: Previously identified infiltrative mass lesion at the central skull base appears to have at least been partially resected since previous. Evaluation for residual tumor limited on this noncontrast CT examination. Small amount of hyperdensity seen within the fourth ventricle, suspicious for acute intraventricular hemorrhage (series 3, image 11). This measures up to 1 cm in maximal diameter. Overall ventricular size and morphology is fairly stable without obstructive hydrocephalus or trapping. No other acute intracranial hemorrhage. No acute large vessel territory infarct. No other mass lesion, mass effect, or midline shift. No extra-axial fluid collection. Vascular: No hyperdense vessel. Scattered vascular calcifications noted  within the carotid siphons. Skull: Scalp soft tissues demonstrate no acute abnormality. Calvarium intact. Sinuses/Orbits: Globes and orbital soft tissues within normal limits. Small left maxillary sinus retention cyst noted. Paranasal sinuses are otherwise largely clear. Chronic right mastoid and middle ear effusion noted. IMPRESSION: 1. Small volume acute intraventricular hemorrhage involving the fourth ventricle. No evidence for obstructive hydrocephalus. 2. Suspected at least partial interval resection of previously identified infiltrative mass lesion at the central skull base. Evaluation for residual tumor limited on this noncontrast CT examination. 3. No other acute intracranial abnormality. 4. Chronic right mastoid and middle ear effusion. Critical Value/emergent results were called by telephone at the time of interpretation on 09/17/2019 at 7:38 pm to provider [facility] , who verbally acknowledged these results. Electronically Signed   By: Jeannine Boga M.D.   On: 09/17/2019 19:39   MR BRAIN WO CONTRAST  Result Date:  09/20/2019 CLINICAL DATA:  Stroke follow-up. EXAM: MRI HEAD WITHOUT CONTRAST TECHNIQUE: Multiplanar, multiecho pulse sequences of the brain and surrounding structures were obtained without intravenous contrast. COMPARISON:  Head CT performed on September 20, 2019 at 6:48 a.m. MRI of the brain June 28, 2016. FINDINGS: Brain: Area of restricted diffusion is seen involving predominantly the right PICA territory with extension to the medial aspect of the left inferior left cerebellar hemisphere, in a similar extension as compared to recent head CT. No brainstem involvement. No hydrocephalus. Susceptibility artifact within the fourth ventricles and in the dependent portion of the bilateral occipital horns represent known intraventricular hemorrhage. More linear appearing susceptibility artifact in the fourth ventricle/inferior aspect of the cerebellar hemisphere corresponds to liquid embolic cast. Mild increase FLAIR signal within the cerebral sulci may reflect minimal residual subarachnoid hemorrhage from blood redistribution. Unchanged small remote right putaminal infarct and few scattered white matter foci of T2 hyperintensity. Enlarged sella with erosion of the clivus with predominantly T2 hyperintense solid cystic mass lesion and anterior displacement of the right carotid siphon which is encased by the mass, without stenosis. Compared to prior study, the lesion appear to have increased T2 signal with increased cystic component. Vascular: Normal flow voids, noting that the posteroinferior cerebellar artery is too small to be accurately evaluated with this technique. Skull and upper cervical spine: Normal marrow signal. Sinuses/Orbits: Mucous retention cyst in the left maxillary sinus. Mild mucosal thickening of the ethmoid cells. Other: Small amount of fluid in the right mastoid cells. Decreased diameter of the nasopharynx which may be associated with obstructive sleep apnea. IMPRESSION: Evolving bilateral PICA territory  infarct, similar in extension to prior CT. There is no hydrocephalus. These results were called by telephone at the time of interpretation on 09/20/2019 at 1:38 pm to provider [facility] , who verbally acknowledged these results. Electronically Signed   By: Pedro Earls M.D.   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 He presented with acute onset of posterior headache, dizziness and vomiting on 09/17/2019. 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 EXAM: DIAGNOSTIC CEREBRAL ANGIOGRAM AND ENDOVASCULAR EMBOLIZATION COMPARISON:  Diagnostic cerebral angiogram 09/18/2019 MEDICATIONS: No antibiotic administered. ANESTHESIA/SEDATION: The procedure was performed under general anesthesia. FLUOROSCOPY TIME:  Fluoroscopy Time: 74 minutes 42 seconds (5436 mGy). COMPLICATIONS: SIR LEVEL B - Normal therapy,  includes overnight admission for observation. TECHNIQUE: Informed written consent was obtained from the patient after a thorough discussion of the procedural risks, benefits and alternatives. All questions were addressed. Maximal Sterile Barrier Technique was utilized including caps, mask, sterile gowns, sterile gloves, sterile drape, hand hygiene and skin antiseptic. A timeout was performed prior to the initiation of the procedure. Real-time ultrasound guidance was utilized for vascular access including the acquisition of a permanent ultrasound image documenting patency of the accessed vessel. Using the modified Seldinger technique and a micropuncture kit, access was gained to the right radial artery at the wrist and a 6 French sheath  was placed. Slow intra arterial infusion of 5,000 IU heparin, 5 mg Verapamil and 200 mcg nitroglycerin diluted in patient's own blood was performed. No significant fluctuation in patient's blood pressure seen. Then, a right radial artery angiogram was obtained via sheath side port. Normal brachial artery branching pattern seen. No significant anatomical variation. The right radial artery caliber is adequate for vascular access. Then, a 5 [place] Simmons 2 glide catheter was navigated over a 0.035 inch Terumo Glidewire into the right subclavian artery. A right subclavian artery roadmap was obtained. The catheter was then advanced into the V2 segment of the right vertebral artery. Townes and lateral views of the head were obtained centered on the posterior fossa. 3D rotational angiograms were acquired and post processed in a separate workstation under concurrent attending physician supervision. Selected images were sent to PACS. FINDINGS: Again seen is a 5 mm dissecting aneurysm as seen on prior angiogram. However, it appears that this aneurysm originates from a dissected branch off of the telovelotonsillar segment of the right posterior inferior cerebellar artery (PICA). The right vertebral artery, basilar artery, and bilateral posterior cerebral arteries are unremarkable. No abnormally high-flow, early draining veins are seen. No regions of abnormal hypervascularity are noted. The visualized dural sinuses are patent. PROCEDURE: Under biplane roadmap, the Simmons 2 catheter was removed over the wire. Intra arterial infusion of 5000 units of heparin, 200 mcg of nitroglycerin and 5 mg of verapamil was performed to the right radial artery via sheath side port. Then, a benchmark guide catheter was advanced over the wire and under fluoroscopic guidance into the distal V2 segment of the right vertebral artery. Magnified frontal and lateral views of the head were obtained in the working projections. Using biplane roadmap, a  headway duo microcatheter was coaxially advanced over a synchro 2 micro guidewire into the right vertebral artery and then navigated into the right posteroinferior cerebellar artery. Frontal and lateral angiograms were obtained with microcatheter contrast injection. The microcatheter was advanced near the origin of the target vessel. Attempted placement of a 2 mm x 4 cm Target 360 Nano coil proved unsuccessful. Follow-up right vertebral artery angiograms with magnified frontal and lateral views of the head were projections obtained. The microcatheter was then advanced and wedged at the origin of the right targeted PICA branch. The microcatheter was then prepped with DMSO. Following, slow injection of onyx 18 was performed under fluoroscopy. Embolization of the target vessel achieved. Small reflux into the PICA noted. The catheter was removed under constant aspiration. Follow-up angiogram showed occlusion of PICA at the telovelotonsillar segment. Right vertebral artery angiograms with frontal and lateral views of the entire head to evidence of distal thromboembolic complication it. Flat panel CT of the head was obtained and post processed in a separate workstation with concurrent attending physician supervision. Selected images were sent to PACS. No evidence of hemorrhagic complication noted.  The catheter was subsequently withdrawn. The right radial sheath was removed and inflatable band was placed over the access site for patent hemostasis. 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 PLAN: 1. Continued observation in ICU. 2. Stat head CT for any neurological deterioration. Patient and spouse communicated in person. Electronically Signed   By: Pedro Earls M.D.   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 He presented with acute onset of posterior headache, dizziness and vomiting on 09/17/2019. 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 EXAM: DIAGNOSTIC CEREBRAL ANGIOGRAM AND ENDOVASCULAR EMBOLIZATION COMPARISON:  Diagnostic cerebral angiogram 09/18/2019 MEDICATIONS: No antibiotic administered. ANESTHESIA/SEDATION: The procedure was performed under general anesthesia. FLUOROSCOPY TIME:  Fluoroscopy Time: 74 minutes 42 seconds (5436 mGy). COMPLICATIONS: SIR LEVEL B - Normal therapy, includes overnight admission for observation. TECHNIQUE: Informed written consent was obtained from the patient after a thorough discussion of the procedural risks, benefits and alternatives. All questions were addressed. Maximal Sterile Barrier Technique was utilized including caps, mask, sterile gowns, sterile gloves, sterile drape, hand hygiene and skin antiseptic. A timeout was performed prior to the initiation of the procedure. Real-time ultrasound guidance was utilized for vascular access including the acquisition of a permanent ultrasound image documenting patency of the accessed vessel. Using the modified Seldinger technique and a micropuncture kit, access was gained to the right radial artery at the wrist and a 6 French sheath was placed. Slow intra arterial infusion of 5,000 IU heparin, 5 mg Verapamil and 200 mcg nitroglycerin diluted in patient's own blood was performed. No significant fluctuation in patient's blood pressure seen. Then, a right radial artery angiogram was obtained via sheath side port. Normal brachial artery branching pattern seen. No significant anatomical variation. The right radial artery caliber is adequate for vascular access. Then,  a 5 [place] Simmons 2 glide catheter was navigated over a 0.035 inch Terumo Glidewire into the right subclavian artery. A right subclavian artery roadmap was obtained. The catheter was then advanced into the V2 segment of the right vertebral artery. Townes and lateral views of the head were obtained centered on the posterior fossa. 3D rotational angiograms were acquired and post processed in a separate workstation under concurrent attending physician supervision. Selected images were sent to PACS. FINDINGS: Again seen is a 5 mm dissecting aneurysm as seen on prior angiogram. However, it appears that this aneurysm originates from a dissected branch off of the telovelotonsillar segment of the right posterior inferior cerebellar artery (PICA). The right vertebral artery, basilar artery, and bilateral posterior cerebral arteries are unremarkable. No abnormally high-flow, early draining veins are seen. No regions of abnormal hypervascularity are noted. The visualized dural sinuses are patent. PROCEDURE: Under biplane roadmap, the Simmons 2 catheter was removed over the wire. Intra arterial infusion of 5000 units of heparin, 200 mcg of nitroglycerin and 5 mg of verapamil was performed to the right radial artery via sheath side port. Then, a benchmark guide catheter was advanced over the wire and under fluoroscopic guidance into the distal V2 segment of the right vertebral artery. Magnified frontal and lateral views of the head were obtained in the working projections. Using biplane roadmap,  a headway duo microcatheter was coaxially advanced over a synchro 2 micro guidewire into the right vertebral artery and then navigated into the right posteroinferior cerebellar artery. Frontal and lateral angiograms were obtained with microcatheter contrast injection. The microcatheter was advanced near the origin of the target vessel. Attempted placement of a 2 mm x 4 cm Target 360 Nano coil proved unsuccessful. Follow-up right  vertebral artery angiograms with magnified frontal and lateral views of the head were projections obtained. The microcatheter was then advanced and wedged at the origin of the right targeted PICA branch. The microcatheter was then prepped with DMSO. Following, slow injection of onyx 18 was performed under fluoroscopy. Embolization of the target vessel achieved. Small reflux into the PICA noted. The catheter was removed under constant aspiration. Follow-up angiogram showed occlusion of PICA at the telovelotonsillar segment. Right vertebral artery angiograms with frontal and lateral views of the entire head to evidence of distal thromboembolic complication it. Flat panel CT of the head was obtained and post processed in a separate workstation with concurrent attending physician supervision. Selected images were sent to PACS. No evidence of hemorrhagic complication noted. The catheter was subsequently withdrawn. The right radial sheath was removed and inflatable band was placed over the access site for patent hemostasis. 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 PLAN: 1. Continued observation in ICU. 2. Stat head CT for any neurological deterioration. Patient and spouse communicated in person. Electronically Signed   By: Pedro Earls M.D.   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 He presented with acute onset of posterior headache, dizziness and vomiting on 09/17/2019. 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 EXAM: DIAGNOSTIC CEREBRAL  ANGIOGRAM AND ENDOVASCULAR EMBOLIZATION COMPARISON:  Diagnostic cerebral angiogram 09/18/2019 MEDICATIONS: No antibiotic administered. ANESTHESIA/SEDATION: The procedure was performed under general anesthesia. FLUOROSCOPY TIME:  Fluoroscopy Time: 74 minutes 42 seconds (5436 mGy). COMPLICATIONS: SIR LEVEL B - Normal therapy, includes overnight admission for observation. TECHNIQUE: Informed written consent was obtained from the patient after a thorough discussion of the procedural risks, benefits and alternatives. All questions were addressed. Maximal Sterile Barrier Technique was utilized including caps, mask, sterile gowns, sterile gloves, sterile drape, hand hygiene and skin antiseptic. A timeout was performed prior to the initiation of the procedure. Real-time ultrasound guidance was utilized for vascular access including the acquisition of a permanent ultrasound image documenting patency of the accessed vessel. Using the modified Seldinger technique and a micropuncture kit, access was gained to the right radial artery at the wrist and a 6 French sheath was placed. Slow intra arterial infusion of 5,000 IU heparin, 5 mg Verapamil and 200 mcg nitroglycerin diluted in patient's own blood was performed. No significant fluctuation in patient's blood pressure seen. Then, a right radial artery angiogram was obtained via sheath side port. Normal brachial artery branching pattern seen. No significant anatomical variation. The right radial artery caliber is adequate for vascular access. Then, a 5 [place] Simmons 2 glide catheter was navigated over a 0.035 inch Terumo Glidewire into the right subclavian artery. A right subclavian artery roadmap was obtained. The catheter was then advanced into the V2 segment of the right  vertebral artery. Townes and lateral views of the head were obtained centered on the posterior fossa. 3D rotational angiograms were acquired and post processed in a separate workstation under concurrent  attending physician supervision. Selected images were sent to PACS. FINDINGS: Again seen is a 5 mm dissecting aneurysm as seen on prior angiogram. However, it appears that this aneurysm originates from a dissected branch off of the telovelotonsillar segment of the right posterior inferior cerebellar artery (PICA). The right vertebral artery, basilar artery, and bilateral posterior cerebral arteries are unremarkable. No abnormally high-flow, early draining veins are seen. No regions of abnormal hypervascularity are noted. The visualized dural sinuses are patent. PROCEDURE: Under biplane roadmap, the Simmons 2 catheter was removed over the wire. Intra arterial infusion of 5000 units of heparin, 200 mcg of nitroglycerin and 5 mg of verapamil was performed to the right radial artery via sheath side port. Then, a benchmark guide catheter was advanced over the wire and under fluoroscopic guidance into the distal V2 segment of the right vertebral artery. Magnified frontal and lateral views of the head were obtained in the working projections. Using biplane roadmap, a headway duo microcatheter was coaxially advanced over a synchro 2 micro guidewire into the right vertebral artery and then navigated into the right posteroinferior cerebellar artery. Frontal and lateral angiograms were obtained with microcatheter contrast injection. The microcatheter was advanced near the origin of the target vessel. Attempted placement of a 2 mm x 4 cm Target 360 Nano coil proved unsuccessful. Follow-up right vertebral artery angiograms with magnified frontal and lateral views of the head were projections obtained. The microcatheter was then advanced and wedged at the origin of the right targeted PICA branch. The microcatheter was then prepped with DMSO. Following, slow injection of onyx 18 was performed under fluoroscopy. Embolization of the target vessel achieved. Small reflux into the PICA noted. The catheter was removed under constant  aspiration. Follow-up angiogram showed occlusion of PICA at the telovelotonsillar segment. Right vertebral artery angiograms with frontal and lateral views of the entire head to evidence of distal thromboembolic complication it. Flat panel CT of the head was obtained and post processed in a separate workstation with concurrent attending physician supervision. Selected images were sent to PACS. No evidence of hemorrhagic complication noted. The catheter was subsequently withdrawn. The right radial sheath was removed and inflatable band was placed over the access site for patent hemostasis. 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 PLAN: 1. Continued observation in ICU. 2. Stat head CT for any neurological deterioration. Patient and spouse communicated in person. Electronically Signed   By: Pedro Earls M.D.   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 He presented with acute onset of posterior headache, dizziness and vomiting on 09/17/2019. 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 EXAM: DIAGNOSTIC CEREBRAL ANGIOGRAM AND ENDOVASCULAR EMBOLIZATION COMPARISON:  Diagnostic cerebral angiogram 09/18/2019 MEDICATIONS: No antibiotic administered. ANESTHESIA/SEDATION: The procedure was performed under general anesthesia. FLUOROSCOPY TIME:  Fluoroscopy Time: 74 minutes 42 seconds (5436 mGy). COMPLICATIONS: SIR LEVEL B - Normal therapy, includes overnight admission for observation. TECHNIQUE: Informed written consent was obtained from the patient after a  thorough  discussion of the procedural risks, benefits and alternatives. All questions were addressed. Maximal Sterile Barrier Technique was utilized including caps, mask, sterile gowns, sterile gloves, sterile drape, hand hygiene and skin antiseptic. A timeout was performed prior to the initiation of the procedure. Real-time ultrasound guidance was utilized for vascular access including the acquisition of a permanent ultrasound image documenting patency of the accessed vessel. Using the modified Seldinger technique and a micropuncture kit, access was gained to the right radial artery at the wrist and a 6 French sheath was placed. Slow intra arterial infusion of 5,000 IU heparin, 5 mg Verapamil and 200 mcg nitroglycerin diluted in patient's own blood was performed. No significant fluctuation in patient's blood pressure seen. Then, a right radial artery angiogram was obtained via sheath side port. Normal brachial artery branching pattern seen. No significant anatomical variation. The right radial artery caliber is adequate for vascular access. Then, a 5 [place] Simmons 2 glide catheter was navigated over a 0.035 inch Terumo Glidewire into the right subclavian artery. A right subclavian artery roadmap was obtained. The catheter was then advanced into the V2 segment of the right vertebral artery. Townes and lateral views of the head were obtained centered on the posterior fossa. 3D rotational angiograms were acquired and post processed in a separate workstation under concurrent attending physician supervision. Selected images were sent to PACS. FINDINGS: Again seen is a 5 mm dissecting aneurysm as seen on prior angiogram. However, it appears that this aneurysm originates from a dissected branch off of the telovelotonsillar segment of the right posterior inferior cerebellar artery (PICA). The right vertebral artery, basilar artery, and bilateral posterior cerebral arteries are unremarkable. No abnormally high-flow, early  draining veins are seen. No regions of abnormal hypervascularity are noted. The visualized dural sinuses are patent. PROCEDURE: Under biplane roadmap, the Simmons 2 catheter was removed over the wire. Intra arterial infusion of 5000 units of heparin, 200 mcg of nitroglycerin and 5 mg of verapamil was performed to the right radial artery via sheath side port. Then, a benchmark guide catheter was advanced over the wire and under fluoroscopic guidance into the distal V2 segment of the right vertebral artery. Magnified frontal and lateral views of the head were obtained in the working projections. Using biplane roadmap, a headway duo microcatheter was coaxially advanced over a synchro 2 micro guidewire into the right vertebral artery and then navigated into the right posteroinferior cerebellar artery. Frontal and lateral angiograms were obtained with microcatheter contrast injection. The microcatheter was advanced near the origin of the target vessel. Attempted placement of a 2 mm x 4 cm Target 360 Nano coil proved unsuccessful. Follow-up right vertebral artery angiograms with magnified frontal and lateral views of the head were projections obtained. The microcatheter was then advanced and wedged at the origin of the right targeted PICA branch. The microcatheter was then prepped with DMSO. Following, slow injection of onyx 18 was performed under fluoroscopy. Embolization of the target vessel achieved. Small reflux into the PICA noted. The catheter was removed under constant aspiration. Follow-up angiogram showed occlusion of PICA at the telovelotonsillar segment. Right vertebral artery angiograms with frontal and lateral views of the entire head to evidence of distal thromboembolic complication it. Flat panel CT of the head was obtained and post processed in a separate workstation with concurrent attending physician supervision. Selected images were sent to PACS. No evidence of hemorrhagic complication noted. The  catheter was subsequently withdrawn. The right radial sheath was removed and inflatable band was placed over  the access site for patent hemostasis. 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 PLAN: 1. Continued observation in ICU. 2. Stat head CT for any neurological deterioration. Patient and spouse communicated in person. Electronically Signed   By: Pedro Earls M.D.   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 He presented with acute onset of posterior headache, dizziness and vomiting on 09/17/2019. 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 EXAM: DIAGNOSTIC CEREBRAL ANGIOGRAM AND ENDOVASCULAR EMBOLIZATION COMPARISON:  Diagnostic cerebral angiogram 09/18/2019 MEDICATIONS: No antibiotic administered. ANESTHESIA/SEDATION: The procedure was performed under general anesthesia. FLUOROSCOPY TIME:  Fluoroscopy Time: 74 minutes 42 seconds (5436 mGy). COMPLICATIONS: SIR LEVEL B - Normal therapy, includes overnight admission for observation. TECHNIQUE: Informed written consent was obtained from the patient after a thorough discussion of the procedural risks, benefits and alternatives. All questions were addressed. Maximal Sterile Barrier Technique was utilized including caps, mask, sterile gowns, sterile gloves, sterile drape, hand hygiene and skin antiseptic. A timeout was performed prior to the initiation of the procedure. Real-time ultrasound guidance was utilized for vascular access including the acquisition of a permanent ultrasound image  documenting patency of the accessed vessel. Using the modified Seldinger technique and a micropuncture kit, access was gained to the right radial artery at the wrist and a 6 French sheath was placed. Slow intra arterial infusion of 5,000 IU heparin, 5 mg Verapamil and 200 mcg nitroglycerin diluted in patient's own blood was performed. No significant fluctuation in patient's blood pressure seen. Then, a right radial artery angiogram was obtained via sheath side port. Normal brachial artery branching pattern seen. No significant anatomical variation. The right radial artery caliber is adequate for vascular access. Then, a 5 [place] Simmons 2 glide catheter was navigated over a 0.035 inch Terumo Glidewire into the right subclavian artery. A right subclavian artery roadmap was obtained. The catheter was then advanced into the V2 segment of the right vertebral artery. Townes and lateral views of the head were obtained centered on the posterior fossa. 3D rotational angiograms were acquired and post processed in a separate workstation under concurrent attending physician supervision. Selected images were sent to PACS. FINDINGS: Again seen is a 5 mm dissecting aneurysm as seen on prior angiogram. However, it appears that this aneurysm originates from a dissected branch off of the telovelotonsillar segment of the right posterior inferior cerebellar artery (PICA). The right vertebral artery, basilar artery, and bilateral posterior cerebral arteries are unremarkable. No abnormally high-flow, early draining veins are seen. No regions of abnormal hypervascularity are noted. The visualized dural sinuses are patent. PROCEDURE: Under biplane roadmap, the Simmons 2 catheter was removed over the wire. Intra arterial infusion of 5000 units of heparin, 200 mcg of nitroglycerin and 5 mg of verapamil was performed to the right radial artery via sheath side port. Then, a benchmark guide catheter was advanced over the wire and under  fluoroscopic guidance into the distal V2 segment of the right vertebral artery. Magnified frontal and lateral views of the head were obtained in the working projections. Using biplane roadmap, a headway duo microcatheter was coaxially advanced over a synchro 2 micro guidewire into the right  vertebral artery and then navigated into the right posteroinferior cerebellar artery. Frontal and lateral angiograms were obtained with microcatheter contrast injection. The microcatheter was advanced near the origin of the target vessel. Attempted placement of a 2 mm x 4 cm Target 360 Nano coil proved unsuccessful. Follow-up right vertebral artery angiograms with magnified frontal and lateral views of the head were projections obtained. The microcatheter was then advanced and wedged at the origin of the right targeted PICA branch. The microcatheter was then prepped with DMSO. Following, slow injection of onyx 18 was performed under fluoroscopy. Embolization of the target vessel achieved. Small reflux into the PICA noted. The catheter was removed under constant aspiration. Follow-up angiogram showed occlusion of PICA at the telovelotonsillar segment. Right vertebral artery angiograms with frontal and lateral views of the entire head to evidence of distal thromboembolic complication it. Flat panel CT of the head was obtained and post processed in a separate workstation with concurrent attending physician supervision. Selected images were sent to PACS. No evidence of hemorrhagic complication noted. The catheter was subsequently withdrawn. The right radial sheath was removed and inflatable band was placed over the access site for patent hemostasis. 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 PLAN: 1. Continued observation in ICU. 2. Stat head CT for any neurological deterioration. Patient and spouse communicated in person. Electronically Signed   By:  Pedro Earls M.D.   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 He presented with acute onset of posterior headache, dizziness and vomiting on 09/17/2019. 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 Given recurrence of hemorrhage in the same location, further investigation with a diagnostic cerebral angiogram was recommended. EXAM: Diagnostic cerebral angiogram COMPARISON:  CT angiogram of the head July 01, 2016 MEDICATIONS: No antibiotics given. ANESTHESIA/SEDATION: Versed 0.5 mg IV; Fentanyl 50 mcg IV Moderate Sedation Time:  60 The patient was continuously monitored during the procedure by the interventional radiology nurse under my direct supervision. FLUOROSCOPY TIME:  Fluoroscopy Time: 14 minutes 30 seconds (1656 mGy). COMPLICATIONS: None immediate. TECHNIQUE: Informed written consent was obtained from the patient after a thorough discussion of the procedural risks, benefits and alternatives. All questions were addressed. Maximal Sterile Barrier Technique was utilized including caps, mask, sterile gowns, sterile gloves, sterile drape, hand hygiene and skin antiseptic. A timeout was performed prior to the initiation of the procedure. Using the modified Seldinger technique and a micropuncture kit, access was gained to the right radial artery at the wrist and a 5 French sheath was placed. Slow intra arterial infusion of 5,000 IU heparin, 5 mg Verapamil and 371 mcg nitroglicerin diluted in patient's own blood was performed. No significant fluctuation in patient's blood pressure seen. Then, a right radial artery angiogram was obtained via sheath side port. Normal brachial artery branching pattern seen. No significant anatomical variation.  The right radial artery caliber is adequate for vascular access. Then, a 5 [place] Simmons 2 glide catheter was navigated over a 0.035 inch Terumo Glidewire into the right subclavian artery. A right subclavian artery roadmap was obtained. The catheter was then advanced into the V2 segment of the right vertebral artery. Townes, lateral and bilateral magnified oblique views of the head were obtained centered on the posterior fossa. The catheter was  then placed into the aortic arch and then advanced into the left common carotid artery. Left anterior oblique view of the neck was obtained followed by frontal, lateral and bilateral oblique views of the head. The catheter was then placed into the left subclavian artery. Frontal and lateral angiograms of the head were obtained followed by frontal the angiogram of the neck. The catheter was subsequently placed in the right common carotid artery. Right anterior oblique view of the neck was obtained. Following, frontal, lateral and bilateral oblique views of the head were obtained. The catheter was subsequently withdrawn. The right radial artery sheath was removed and inflatable band was placed over the access site for patent hemostasis. FINDINGS: Right radial artery ultrasound and right radial artery angiogram: The caliber of the distal right radial artery is appropriate for angiogram access. The right radial artery and the right ulnar artery have normal course and caliber. No significant anatomical variants noted. Right vertebral artery angiograms: The right vertebral artery, basilar artery, and bilateral posterior cerebral arteries are unremarkable. 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 No abnormally high-flow, early draining veins are seen. No regions of abnormal hypervascularity are noted. The visualized dural sinuses are patent. Left CCA angiograms: Cervical angiograms  show normal course and caliber of the visualized left common carotid and internal carotid arteries. There are no significant stenoses. There is brisk vascular contrast filling of the the ACA and MCA vascular trees. Luminal caliber is smooth and tapering. No aneurysms or abnormally high-flow, early draining veins are seen. No regions of abnormal hypervascularity are noted. The intracranial branches of the left external carotid artery are unremarkable. The visualized dural sinuses are patent. Left subclavian and thyrocervical angiograms: The visualized subclavian artery, proximal left vertebral artery and visualized branches of the thyrocervical trunk are unremarkable. Suboptimal opacification of the intracranial vasculature obtained. The left vertebral artery is codominant and of normal caliber. Right CCA angiograms: Cervical angiograms show normal course and caliber of the visualized right common carotid and internal carotid arteries. There are no significant stenoses. There is brisk vascular contrast filling of the ACA and MCA vascular trees. Luminal caliber is smooth and tapering. No aneurysms or abnormally high-flow, early draining veins are seen. No regions of abnormal hypervascularity are noted. The intracranial branches of the right external carotid artery are unremarkable. The visualized dural sinuses are patent. PROCEDURE: Not applicable. 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 PLAN: Angiogram findings discussed with the patient and his wife as well as with Dr. Erlinda Hong. Planned for angiogram with aneurysm embolization tomorrow. Electronically Signed   By: Pedro Earls M.D.   On: 09/19/2019 16:01   ECHOCARDIOGRAM COMPLETE  Result Date: 09/18/2019    ECHOCARDIOGRAM REPORT   Patient Name:   RODNEY YERA Date of Exam: 09/18/2019 Medical Rec #:  [id number]     Height:        71.0 in Accession #:    [id number]    Weight:       418.4 lb Date of Birth:  October 19, 1965     BSA:          2.886 m Patient Age:    88 years      BP:           124/54 mmHg Patient Gender: M  HR:           62 bpm. Exam Location:  Inpatient Procedure: 2D Echo, Color Doppler, Cardiac Doppler and Intracardiac            Opacification Agent Indications:    Stroke i163.9  History:        Patient has prior history of Echocardiogram examinations, most                 recent 06/28/2016. Risk Factors:Hypertension, Dyslipidemia and                 Sleep Apnea.  Sonographer:    Raquel Sarna Senior RDCS Referring Phys: [id number] ASHISH ARORA  Sonographer Comments: Technically difficult study due to patient body habitus and poor echo windows. IMPRESSIONS  1. Left ventricular ejection fraction, by estimation, is 55 to 60%. The left ventricle has normal function. The left ventricle has no regional wall motion abnormalities. There is moderate left ventricular hypertrophy. Left ventricular diastolic parameters are consistent with Grade II diastolic dysfunction (pseudonormalization).  2. Right ventricular systolic function is normal. The right ventricular size is mildly enlarged. Tricuspid regurgitation signal is inadequate for assessing PA pressure.  3. Left atrial size was moderately dilated.  4. Right atrial size was mildly dilated.  5. The mitral valve is normal in structure. No evidence of mitral valve regurgitation. No evidence of mitral stenosis.  6. The aortic valve is tricuspid. Aortic valve regurgitation is not visualized. No aortic stenosis is present.  7. Aortic dilatation noted. There is mild dilatation of the ascending aorta measuring 39 mm.  8. The inferior vena cava is dilated in size with <50% respiratory variability, suggesting right atrial pressure of 15 mmHg.  9. Technically difficult study with poor acoustic windows. FINDINGS  Left Ventricle: Left ventricular ejection fraction, by estimation, is 55 to 60%. The  left ventricle has normal function. The left ventricle has no regional wall motion abnormalities. Definity contrast agent was given IV to delineate the left ventricular  endocardial borders. The left ventricular internal cavity size was normal in size. There is moderate left ventricular hypertrophy. Left ventricular diastolic parameters are consistent with Grade II diastolic dysfunction (pseudonormalization). Right Ventricle: The right ventricular size is mildly enlarged. No increase in right ventricular wall thickness. Right ventricular systolic function is normal. Tricuspid regurgitation signal is inadequate for assessing PA pressure. Left Atrium: Left atrial size was moderately dilated. Right Atrium: Right atrial size was mildly dilated. Pericardium: There is no evidence of pericardial effusion. Mitral Valve: The mitral valve is normal in structure. No evidence of mitral valve regurgitation. No evidence of mitral valve stenosis. Tricuspid Valve: The tricuspid valve is normal in structure. Tricuspid valve regurgitation is not demonstrated. Aortic Valve: The aortic valve is tricuspid. Aortic valve regurgitation is not visualized. No aortic stenosis is present. Pulmonic Valve: The pulmonic valve was normal in structure. Pulmonic valve regurgitation is not visualized. Aorta: Aortic dilatation noted. There is mild dilatation of the ascending aorta measuring 39 mm. Venous: The inferior vena cava is dilated in size with less than 50% respiratory variability, suggesting right atrial pressure of 15 mmHg. IAS/Shunts: The interatrial septum was not well visualized.  LEFT VENTRICLE PLAX 2D LVIDd:         5.47 cm  Diastology LVIDs:         2.83 cm  LV e' lateral:   7.72 cm/s LV PW:         1.87 cm  LV E/e' lateral: 14.6 LV IVS:  1.67 cm  LV e' medial:    7.40 cm/s LVOT diam:     2.30 cm  LV E/e' medial:  15.3 LV SV:         94 LV SV Index:   33 LVOT Area:     4.15 cm  RIGHT VENTRICLE RV S prime:     16.90 cm/s TAPSE  (M-mode): 3.2 cm LEFT ATRIUM              Index       RIGHT ATRIUM           Index LA diam:        3.50 cm  1.21 cm/m  RA Area:     25.10 cm LA Vol (A2C):   105.0 ml 36.39 ml/m RA Volume:   78.70 ml  27.27 ml/m LA Vol (A4C):   149.0 ml 51.64 ml/m LA Biplane Vol: 138.0 ml 47.82 ml/m  AORTIC VALVE LVOT Vmax:   112.00 cm/s LVOT Vmean:  70.000 cm/s LVOT VTI:    0.227 m  AORTA Ao Root diam: 3.60 cm Ao Asc diam:  3.85 cm MITRAL VALVE MV Area (PHT): 2.80 cm     SHUNTS MV Decel Time: 271 msec     Systemic VTI:  0.23 m MV E velocity: 113.00 cm/s  Systemic Diam: 2.30 cm MV A velocity: 112.00 cm/s MV E/A ratio:  1.01 Loralie Champagne MD Electronically signed by Loralie Champagne MD Signature Date/Time: 09/18/2019/4:52:31 PM    Final    VAS [place] LOWER EXTREMITY VENOUS (DVT)  Result Date: 09/24/2019  Lower Venous DVTStudy Indications: Stroke.  Limitations: Body habitus. Performing Technologist: June Leap RDMS, RVT  Examination Guidelines: A complete evaluation includes B-mode imaging, spectral Doppler, color Doppler, and power Doppler as needed of all accessible portions of each vessel. Bilateral testing is considered an integral part of a complete examination. Limited examinations for reoccurring indications may be performed as noted. The reflux portion of the exam is performed with the patient in reverse Trendelenburg.  +---------+---------------+---------+-----------+----------+--------------+ RIGHT    CompressibilityPhasicitySpontaneityPropertiesThrombus Aging +---------+---------------+---------+-----------+----------+--------------+ CFV      Full           Yes      Yes                                 +---------+---------------+---------+-----------+----------+--------------+ SFJ      Full                                                        +---------+---------------+---------+-----------+----------+--------------+ FV Prox  Full                                                         +---------+---------------+---------+-----------+----------+--------------+ FV Mid   Full                                                        +---------+---------------+---------+-----------+----------+--------------+ FV DistalFull                                                        +---------+---------------+---------+-----------+----------+--------------+  PFV      Full                                                        +---------+---------------+---------+-----------+----------+--------------+ POP      Full           Yes      Yes                                 +---------+---------------+---------+-----------+----------+--------------+ PTV      Full                                                        +---------+---------------+---------+-----------+----------+--------------+ PERO     Full                                                        +---------+---------------+---------+-----------+----------+--------------+   +---------+---------------+---------+-----------+----------+--------------+ LEFT     CompressibilityPhasicitySpontaneityPropertiesThrombus Aging +---------+---------------+---------+-----------+----------+--------------+ CFV      Full           Yes      Yes                                 +---------+---------------+---------+-----------+----------+--------------+ SFJ      Full                                                        +---------+---------------+---------+-----------+----------+--------------+ FV Prox  Full                                                        +---------+---------------+---------+-----------+----------+--------------+ FV Mid   Full                                                        +---------+---------------+---------+-----------+----------+--------------+ FV DistalFull                                                         +---------+---------------+---------+-----------+----------+--------------+ PFV      Full                                                        +---------+---------------+---------+-----------+----------+--------------+   POP      Full           Yes      Yes                                 +---------+---------------+---------+-----------+----------+--------------+ PTV      None                                         Acute          +---------+---------------+---------+-----------+----------+--------------+ PERO     Full                                                        +---------+---------------+---------+-----------+----------+--------------+     Summary: RIGHT: - There is no evidence of deep vein thrombosis in the lower extremity.  LEFT: - Findings consistent with acute deep vein thrombosis involving the left posterior tibial veins.  *See table(s) above for measurements and observations. Electronically signed by Deitra Mayo MD on 09/24/2019 at 4:05:02 PM.    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 He presented with acute onset of posterior headache, dizziness and vomiting on 09/17/2019. 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 Given recurrence of hemorrhage in the same location, further investigation with a diagnostic cerebral angiogram was recommended. EXAM: Diagnostic cerebral angiogram COMPARISON:  CT angiogram of the head July 01, 2016 MEDICATIONS: No antibiotics given. ANESTHESIA/SEDATION: Versed 0.5 mg IV; Fentanyl 50 mcg IV Moderate Sedation Time:  60 The patient was continuously monitored during the procedure by the interventional radiology nurse under my direct supervision.  FLUOROSCOPY TIME:  Fluoroscopy Time: 14 minutes 30 seconds (1656 mGy). COMPLICATIONS: None immediate. TECHNIQUE: Informed written consent was obtained from the patient after a thorough discussion of the procedural risks, benefits and alternatives. All questions were addressed. Maximal Sterile Barrier Technique was utilized including caps, mask, sterile gowns, sterile gloves, sterile drape, hand hygiene and skin antiseptic. A timeout was performed prior to the initiation of the procedure. Using the modified Seldinger technique and a micropuncture kit, access was gained to the right radial artery at the wrist and a 5 French sheath was placed. Slow intra arterial infusion of 5,000 IU heparin, 5 mg Verapamil and 852 mcg nitroglicerin diluted in patient's own blood was performed. No significant fluctuation in patient's blood pressure seen. Then, a right radial artery angiogram was obtained via sheath side port. Normal brachial artery branching pattern seen. No significant anatomical variation. The right radial artery caliber is adequate for vascular access. Then, a 5 [place] Simmons 2 glide catheter was navigated over a 0.035 inch Terumo Glidewire into the right subclavian artery. A right subclavian artery roadmap was obtained. The catheter was then advanced into the V2 segment of the right vertebral artery. Townes, lateral and bilateral magnified oblique views of the head were obtained centered on the posterior fossa. The catheter was then placed into the aortic arch and then advanced into the left common carotid  artery. Left anterior oblique view of the neck was obtained followed by frontal, lateral and bilateral oblique views of the head. The catheter was then placed into the left subclavian artery. Frontal and lateral angiograms of the head were obtained followed by frontal the angiogram of the neck. The catheter was subsequently placed in the right common carotid artery. Right anterior oblique view of the neck was  obtained. Following, frontal, lateral and bilateral oblique views of the head were obtained. The catheter was subsequently withdrawn. The right radial artery sheath was removed and inflatable band was placed over the access site for patent hemostasis. FINDINGS: Right radial artery ultrasound and right radial artery angiogram: The caliber of the distal right radial artery is appropriate for angiogram access. The right radial artery and the right ulnar artery have normal course and caliber. No significant anatomical variants noted. Right vertebral artery angiograms: The right vertebral artery, basilar artery, and bilateral posterior cerebral arteries are unremarkable. 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 No abnormally high-flow, early draining veins are seen. No regions of abnormal hypervascularity are noted. The visualized dural sinuses are patent. Left CCA angiograms: Cervical angiograms show normal course and caliber of the visualized left common carotid and internal carotid arteries. There are no significant stenoses. There is brisk vascular contrast filling of the the ACA and MCA vascular trees. Luminal caliber is smooth and tapering. No aneurysms or abnormally high-flow, early draining veins are seen. No regions of abnormal hypervascularity are noted. The intracranial branches of the left external carotid artery are unremarkable. The visualized dural sinuses are patent. Left subclavian and thyrocervical angiograms: The visualized subclavian artery, proximal left vertebral artery and visualized branches of the thyrocervical trunk are unremarkable. Suboptimal opacification of the intracranial vasculature obtained. The left vertebral artery is codominant and of normal caliber. Right CCA angiograms: Cervical angiograms show normal course and caliber of the visualized right common carotid and internal carotid  arteries. There are no significant stenoses. There is brisk vascular contrast filling of the ACA and MCA vascular trees. Luminal caliber is smooth and tapering. No aneurysms or abnormally high-flow, early draining veins are seen. No regions of abnormal hypervascularity are noted. The intracranial branches of the right external carotid artery are unremarkable. The visualized dural sinuses are patent. PROCEDURE: Not applicable. 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 PLAN: Angiogram findings discussed with the patient and his wife as well as with Dr. Erlinda Hong. Planned for angiogram with aneurysm embolization tomorrow. Electronically Signed   By: Pedro Earls M.D.   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 He presented with acute onset of posterior headache, dizziness and vomiting on 09/17/2019. 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 Given recurrence of hemorrhage in the same location, further investigation with a diagnostic cerebral angiogram was recommended. EXAM: Diagnostic cerebral angiogram COMPARISON:  CT angiogram of the head July 01, 2016 MEDICATIONS: No antibiotics given. ANESTHESIA/SEDATION: Versed 0.5 mg IV; Fentanyl 50 mcg IV Moderate Sedation Time:  60 The patient was continuously monitored during the procedure by the interventional radiology nurse under my direct supervision. FLUOROSCOPY TIME:  Fluoroscopy Time:  14 minutes 30 seconds (1656 mGy). COMPLICATIONS: None immediate. TECHNIQUE: Informed written consent was obtained from the patient after a thorough discussion  of the procedural risks, benefits and alternatives. All questions were addressed. Maximal Sterile Barrier Technique was utilized including caps, mask, sterile gowns, sterile gloves, sterile drape, hand hygiene and skin antiseptic. A timeout was performed prior to the initiation of the procedure. Using the modified Seldinger technique and a micropuncture kit, access was gained to the right radial artery at the wrist and a 5 French sheath was placed. Slow intra arterial infusion of 5,000 IU heparin, 5 mg Verapamil and 122 mcg nitroglicerin diluted in patient's own blood was performed. No significant fluctuation in patient's blood pressure seen. Then, a right radial artery angiogram was obtained via sheath side port. Normal brachial artery branching pattern seen. No significant anatomical variation. The right radial artery caliber is adequate for vascular access. Then, a 5 [place] Simmons 2 glide catheter was navigated over a 0.035 inch Terumo Glidewire into the right subclavian artery. A right subclavian artery roadmap was obtained. The catheter was then advanced into the V2 segment of the right vertebral artery. Townes, lateral and bilateral magnified oblique views of the head were obtained centered on the posterior fossa. The catheter was then placed into the aortic arch and then advanced into the left common carotid artery. Left anterior oblique view of the neck was obtained followed by frontal, lateral and bilateral oblique views of the head. The catheter was then placed into the left subclavian artery. Frontal and lateral angiograms of the head were obtained followed by frontal the angiogram of the neck. The catheter was subsequently placed in the right common carotid artery. Right anterior oblique view of the neck was obtained. Following, frontal, lateral and bilateral oblique views of the head were obtained. The catheter was subsequently withdrawn. The right radial artery sheath was removed and inflatable band  was placed over the access site for patent hemostasis. FINDINGS: Right radial artery ultrasound and right radial artery angiogram: The caliber of the distal right radial artery is appropriate for angiogram access. The right radial artery and the right ulnar artery have normal course and caliber. No significant anatomical variants noted. Right vertebral artery angiograms: The right vertebral artery, basilar artery, and bilateral posterior cerebral arteries are unremarkable. 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 No abnormally high-flow, early draining veins are seen. No regions of abnormal hypervascularity are noted. The visualized dural sinuses are patent. Left CCA angiograms: Cervical angiograms show normal course and caliber of the visualized left common carotid and internal carotid arteries. There are no significant stenoses. There is brisk vascular contrast filling of the the ACA and MCA vascular trees. Luminal caliber is smooth and tapering. No aneurysms or abnormally high-flow, early draining veins are seen. No regions of abnormal hypervascularity are noted. The intracranial branches of the left external carotid artery are unremarkable. The visualized dural sinuses are patent. Left subclavian and thyrocervical angiograms: The visualized subclavian artery, proximal left vertebral artery and visualized branches of the thyrocervical trunk are unremarkable. Suboptimal opacification of the intracranial vasculature obtained. The left vertebral artery is codominant and of normal caliber. Right CCA angiograms: Cervical angiograms show normal course and caliber of the visualized right common carotid and internal carotid arteries. There are no significant stenoses. There is brisk vascular contrast filling of the ACA and MCA vascular trees. Luminal caliber is smooth and tapering. No aneurysms or  abnormally high-flow,  early draining veins are seen. No regions of abnormal hypervascularity are noted. The intracranial branches of the right external carotid artery are unremarkable. The visualized dural sinuses are patent. PROCEDURE: Not applicable. 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 PLAN: Angiogram findings discussed with the patient and his wife as well as with Dr. Erlinda Hong. Planned for angiogram with aneurysm embolization tomorrow. Electronically Signed   By: Pedro Earls M.D.   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 He presented with acute onset of posterior headache, dizziness and vomiting on 09/17/2019. 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 EXAM: DIAGNOSTIC CEREBRAL ANGIOGRAM AND ENDOVASCULAR EMBOLIZATION COMPARISON:  Diagnostic cerebral angiogram 09/18/2019 MEDICATIONS: No antibiotic administered. ANESTHESIA/SEDATION: The procedure was performed under general anesthesia. FLUOROSCOPY TIME:  Fluoroscopy Time: 74 minutes 42 seconds (5436 mGy). COMPLICATIONS: SIR LEVEL B - Normal therapy, includes overnight admission for observation. TECHNIQUE: Informed written consent was obtained from the patient after a thorough discussion of the procedural risks, benefits and alternatives. All questions were addressed. Maximal Sterile Barrier Technique was utilized including caps, mask, sterile gowns, sterile gloves, sterile drape, hand hygiene and skin antiseptic. A timeout was performed  prior to the initiation of the procedure. Real-time ultrasound guidance was utilized for vascular access including the acquisition of a permanent ultrasound image documenting patency of the accessed vessel. Using the modified Seldinger technique and a micropuncture kit, access was gained to the right radial artery at the wrist and a 6 French sheath was placed. Slow intra arterial infusion of 5,000 IU heparin, 5 mg Verapamil and 200 mcg nitroglycerin diluted in patient's own blood was performed. No significant fluctuation in patient's blood pressure seen. Then, a right radial artery angiogram was obtained via sheath side port. Normal brachial artery branching pattern seen. No significant anatomical variation. The right radial artery caliber is adequate for vascular access. Then, a 5 [place] Simmons 2 glide catheter was navigated over a 0.035 inch Terumo Glidewire into the right subclavian artery. A right subclavian artery roadmap was obtained. The catheter was then advanced into the V2 segment of the right vertebral artery. Townes and lateral views of the head were obtained centered on the posterior fossa. 3D rotational angiograms were acquired and post processed in a separate workstation under concurrent attending physician supervision. Selected images were sent to PACS. FINDINGS: Again seen is a 5 mm dissecting aneurysm as seen on prior angiogram. However, it appears that this aneurysm originates from a dissected branch off of the telovelotonsillar segment of the right posterior inferior cerebellar artery (PICA). The right vertebral artery, basilar artery, and bilateral posterior cerebral arteries are unremarkable. No abnormally high-flow, early draining veins are seen. No regions of abnormal hypervascularity are noted. The visualized dural sinuses are patent. PROCEDURE: Under biplane roadmap, the Simmons 2 catheter was removed over the wire. Intra arterial infusion of 5000 units of heparin, 200 mcg of nitroglycerin  and 5 mg of verapamil was performed to the right radial artery via sheath side port. Then, a benchmark guide catheter was advanced over the wire and under fluoroscopic guidance into the  distal V2 segment of the right vertebral artery. Magnified frontal and lateral views of the head were obtained in the working projections. Using biplane roadmap, a headway duo microcatheter was coaxially advanced over a synchro 2 micro guidewire into the right vertebral artery and then navigated into the right posteroinferior cerebellar artery. Frontal and lateral angiograms were obtained with microcatheter contrast injection. The microcatheter was advanced near the origin of the target vessel. Attempted placement of a 2 mm x 4 cm Target 360 Nano coil proved unsuccessful. Follow-up right vertebral artery angiograms with magnified frontal and lateral views of the head were projections obtained. The microcatheter was then advanced and wedged at the origin of the right targeted PICA branch. The microcatheter was then prepped with DMSO. Following, slow injection of onyx 18 was performed under fluoroscopy. Embolization of the target vessel achieved. Small reflux into the PICA noted. The catheter was removed under constant aspiration. Follow-up angiogram showed occlusion of PICA at the telovelotonsillar segment. Right vertebral artery angiograms with frontal and lateral views of the entire head to evidence of distal thromboembolic complication it. Flat panel CT of the head was obtained and post processed in a separate workstation with concurrent attending physician supervision. Selected images were sent to PACS. No evidence of hemorrhagic complication noted. The catheter was subsequently withdrawn. The right radial sheath was removed and inflatable band was placed over the access site for patent hemostasis. 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 PLAN: 1.  Continued observation in ICU. 2. Stat head CT for any neurological deterioration. Patient and spouse communicated in person. Electronically Signed   By: Pedro Earls M.D.   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 He presented with acute onset of posterior headache, dizziness and vomiting on 09/17/2019. 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 Given recurrence of hemorrhage in the same location, further investigation with a diagnostic cerebral angiogram was recommended. EXAM: Diagnostic cerebral angiogram COMPARISON:  CT angiogram of the head July 01, 2016 MEDICATIONS: No antibiotics given. ANESTHESIA/SEDATION: Versed 0.5 mg IV; Fentanyl 50 mcg IV Moderate Sedation Time:  60 The patient was continuously monitored during the procedure by the interventional radiology nurse under my direct supervision. FLUOROSCOPY TIME:  Fluoroscopy Time: 14 minutes 30 seconds (1656 mGy). COMPLICATIONS: None immediate. TECHNIQUE: Informed written consent was obtained from the patient after a thorough discussion of the procedural risks, benefits and alternatives. All questions were addressed. Maximal Sterile Barrier Technique was utilized including caps, mask, sterile gowns, sterile gloves, sterile drape, hand hygiene and skin antiseptic. A timeout was performed prior to the initiation of the procedure. Using the modified Seldinger technique and a micropuncture kit, access was gained to the right radial artery at the wrist and a 5 French sheath was placed. Slow intra arterial infusion of 5,000 IU heparin, 5 mg Verapamil and 161 mcg nitroglicerin diluted in patient's own blood was performed. No significant fluctuation in patient's blood  pressure seen. Then, a right radial artery angiogram was obtained via sheath side port. Normal brachial artery branching pattern seen. No significant anatomical variation. The right radial artery caliber is adequate for vascular access. Then, a 5 [place] Simmons 2 glide catheter was navigated over a 0.035 inch Terumo Glidewire into the  right subclavian artery. A right subclavian artery roadmap was obtained. The catheter was then advanced into the V2 segment of the right vertebral artery. Townes, lateral and bilateral magnified oblique views of the head were obtained centered on the posterior fossa. The catheter was then placed into the aortic arch and then advanced into the left common carotid artery. Left anterior oblique view of the neck was obtained followed by frontal, lateral and bilateral oblique views of the head. The catheter was then placed into the left subclavian artery. Frontal and lateral angiograms of the head were obtained followed by frontal the angiogram of the neck. The catheter was subsequently placed in the right common carotid artery. Right anterior oblique view of the neck was obtained. Following, frontal, lateral and bilateral oblique views of the head were obtained. The catheter was subsequently withdrawn. The right radial artery sheath was removed and inflatable band was placed over the access site for patent hemostasis. FINDINGS: Right radial artery ultrasound and right radial artery angiogram: The caliber of the distal right radial artery is appropriate for angiogram access. The right radial artery and the right ulnar artery have normal course and caliber. No significant anatomical variants noted. Right vertebral artery angiograms: The right vertebral artery, basilar artery, and bilateral posterior cerebral arteries are unremarkable. 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 No abnormally  high-flow, early draining veins are seen. No regions of abnormal hypervascularity are noted. The visualized dural sinuses are patent. Left CCA angiograms: Cervical angiograms show normal course and caliber of the visualized left common carotid and internal carotid arteries. There are no significant stenoses. There is brisk vascular contrast filling of the the ACA and MCA vascular trees. Luminal caliber is smooth and tapering. No aneurysms or abnormally high-flow, early draining veins are seen. No regions of abnormal hypervascularity are noted. The intracranial branches of the left external carotid artery are unremarkable. The visualized dural sinuses are patent. Left subclavian and thyrocervical angiograms: The visualized subclavian artery, proximal left vertebral artery and visualized branches of the thyrocervical trunk are unremarkable. Suboptimal opacification of the intracranial vasculature obtained. The left vertebral artery is codominant and of normal caliber. Right CCA angiograms: Cervical angiograms show normal course and caliber of the visualized right common carotid and internal carotid arteries. There are no significant stenoses. There is brisk vascular contrast filling of the ACA and MCA vascular trees. Luminal caliber is smooth and tapering. No aneurysms or abnormally high-flow, early draining veins are seen. No regions of abnormal hypervascularity are noted. The intracranial branches of the right external carotid artery are unremarkable. The visualized dural sinuses are patent. PROCEDURE: Not applicable. 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 PLAN: Angiogram findings discussed with the patient and his wife as well as with Dr. Erlinda Hong. Planned for angiogram with aneurysm embolization tomorrow. Electronically Signed   By: Pedro Earls M.D.   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 He presented with acute onset of posterior headache, dizziness and vomiting on 09/17/2019. 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 Diagnostic cerebral angiogram performed on 09/18/2019 showed a dissected right PICA aneurysm, most likely  the source of bleed. EXAM: DIAGNOSTIC CEREBRAL ANGIOGRAM AND ENDOVASCULAR EMBOLIZATION COMPARISON:  Diagnostic cerebral angiogram 09/18/2019 MEDICATIONS: No antibiotic administered. ANESTHESIA/SEDATION: The procedure was performed under general anesthesia. FLUOROSCOPY TIME:  Fluoroscopy Time: 74 minutes 42 seconds (5436 mGy). COMPLICATIONS: SIR LEVEL B - Normal therapy, includes overnight admission for observation. TECHNIQUE: Informed written consent was obtained from the patient after a thorough discussion of the procedural risks, benefits and alternatives. All questions were addressed. Maximal Sterile Barrier Technique was utilized including caps, mask, sterile gowns, sterile gloves, sterile drape, hand hygiene and skin antiseptic. A timeout was performed prior to the initiation of the procedure. Real-time ultrasound guidance was utilized for vascular access including the acquisition of a permanent ultrasound image documenting patency of the accessed vessel. Using the modified Seldinger technique and a micropuncture kit, access was gained to the right radial artery at the wrist and a 6 French sheath was placed. Slow intra arterial infusion of 5,000 IU heparin, 5 mg Verapamil and 200 mcg nitroglycerin diluted in patient's own blood was performed. No significant fluctuation in patient's blood pressure seen. Then, a right radial artery angiogram was obtained via sheath side port.  Normal brachial artery branching pattern seen. No significant anatomical variation. The right radial artery caliber is adequate for vascular access. Then, a 5 [place] Simmons 2 glide catheter was navigated over a 0.035 inch Terumo Glidewire into the right subclavian artery. A right subclavian artery roadmap was obtained. The catheter was then advanced into the V2 segment of the right vertebral artery. Townes and lateral views of the head were obtained centered on the posterior fossa. 3D rotational angiograms were acquired and post processed in a separate workstation under concurrent attending physician supervision. Selected images were sent to PACS. FINDINGS: Again seen is a 5 mm dissecting aneurysm as seen on prior angiogram. However, it appears that this aneurysm originates from a dissected branch off of the telovelotonsillar segment of the right posterior inferior cerebellar artery (PICA). The right vertebral artery, basilar artery, and bilateral posterior cerebral arteries are unremarkable. No abnormally high-flow, early draining veins are seen. No regions of abnormal hypervascularity are noted. The visualized dural sinuses are patent. PROCEDURE: Under biplane roadmap, the Simmons 2 catheter was removed over the wire. Intra arterial infusion of 5000 units of heparin, 200 mcg of nitroglycerin and 5 mg of verapamil was performed to the right radial artery via sheath side port. Then, a benchmark guide catheter was advanced over the wire and under fluoroscopic guidance into the distal V2 segment of the right vertebral artery. Magnified frontal and lateral views of the head were obtained in the working projections. Using biplane roadmap, a headway duo microcatheter was coaxially advanced over a synchro 2 micro guidewire into the right vertebral artery and then navigated into the right posteroinferior cerebellar artery. Frontal and lateral angiograms were obtained with microcatheter contrast injection. The  microcatheter was advanced near the origin of the target vessel. Attempted placement of a 2 mm x 4 cm Target 360 Nano coil proved unsuccessful. Follow-up right vertebral artery angiograms with magnified frontal and lateral views of the head were projections obtained. The microcatheter was then advanced and wedged at the origin of the right targeted PICA branch. The microcatheter was then prepped with DMSO. Following, slow injection of onyx 18 was performed under fluoroscopy. Embolization of the target vessel achieved. Small reflux into the PICA noted. The catheter was removed under constant aspiration. Follow-up angiogram showed occlusion of PICA at the telovelotonsillar segment. Right vertebral artery angiograms with  frontal and lateral views of the entire head to evidence of distal thromboembolic complication it. Flat panel CT of the head was obtained and post processed in a separate workstation with concurrent attending physician supervision. Selected images were sent to PACS. No evidence of hemorrhagic complication noted. The catheter was subsequently withdrawn. The right radial sheath was removed and inflatable band was placed over the access site for patent hemostasis. 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 PLAN: 1. Continued observation in ICU. 2. Stat head CT for any neurological deterioration. Patient and spouse communicated in person. Electronically Signed   By: Pedro Earls M.D.   On: 09/20/2019 09:33    Labs:  CBC: Recent Labs    09/20/19 1049 09/21/19 0511 09/22/19 0355 09/23/19 1251  WBC 14.6* 14.4* 13.5* 11.5*  HGB 12.0* 12.3* 13.3 12.2*  HCT 35.9* 36.9* 39.3 35.6*  PLT 237 271 268 225    COAGS: Recent Labs    09/19/19 0515  INR 1.0    BMP: Recent Labs    09/20/19 1049 09/21/19 0511 09/22/19 0355 09/23/19 1251  NA 137 133* 133* 131*  K 3.8 4.0 4.3 4.1  CL 106 101 98  96*  CO2 '23 24 23 25  '$ GLUCOSE 136* 136* 128* 115*  BUN '17 17 15 17  '$ CALCIUM 8.9 8.8* 9.0 8.8*  CREATININE 0.88 0.87 0.84 0.80  GFRNONAA >60 >60 >60 >60  GFRAA >60 >60 >60 >60    Assessment and Plan:  54 y.o, male inpatient. History of HTN and intraventricular hemorrhage in the fourth ventricle. Presented to this facility with sudden onset of headache, dizziness and vomiting. 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 Team is requesting IVC filter placement.  Pertinent Imaging 6.8.21 - Venous doppler reads Findings consistent with acute deep vein thrombosis involving the left posterior tibial veins.  Pertinent IR History Neuro  Pertinent Allergies Codeine Shellfish   WBC is 11.5  labs and medications are within acceptable parameters.  Patient is afebrile.  Risks and benefits discussed with the patient including, but not limited to bleeding, infection, contrast induced renal failure, filter fracture or migration which can lead to emergency surgery or even death, strut penetration with damage or irritation to adjacent structures and caval thrombosis. All of the patient's questions were answered, patient is agreeable to proceed. Consent signed and in chart.   Thank you for this interesting consult.  I greatly enjoyed meeting SHAKIM FAITH and look forward to participating in their care.  A copy of this report was sent to the requesting provider on this date.  Electronically Signed: Avel Peace, NP 09/25/2019, 7:22 AM   I spent a total of 40 Minutes    in face to face in clinical consultation, greater than 50% of which was counseling/coordinating care for IVC filter placement

## 2019-09-26 MED ORDER — SPIRONOLACTONE 25 MG PO TABS
50.0000 mg | ORAL_TABLET | Freq: Every day | ORAL | Status: DC
  Administered 2019-09-26 – 2019-09-27 (×2): 50 mg via ORAL
  Filled 2019-09-26 (×2): qty 2

## 2019-09-26 MED ORDER — IRBESARTAN 150 MG PO TABS
150.0000 mg | ORAL_TABLET | Freq: Two times a day (BID) | ORAL | Status: DC
  Administered 2019-09-26 – 2019-09-27 (×2): 150 mg via ORAL
  Filled 2019-09-26 (×2): qty 1

## 2019-09-26 NOTE — Progress Notes (Signed)
Systolic blood pressure goal less than 170.

## 2019-09-26 NOTE — Progress Notes (Signed)
Inpatient Rehabilitation-Admissions Coordinator   Pt appears to be weaning off Cleviprex. Will go ahead and initiate insurance authorization process for possible admit to CIR.   Will update once there has been a determination.   Raechel Ache, OTR/L  Rehab Admissions Coordinator  [phone] 09/26/2019 1:37 PM

## 2019-09-26 NOTE — Progress Notes (Signed)
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 Karenann Cai. VAS [place] bilateral lower extremities 09/24/2019 revealed LLE acute DVT. Pt had successful placement of IVC filter yesterday. Doing better, wife at bedside. States he was much more alert this am, a little sleepier at time of this visit. But able to awaken easily and follow instructions.   Allergies: Codeine and Shellfish allergy  Medications:  Current Facility-Administered Medications:  .  acetaminophen (TYLENOL) tablet 650 mg, 650 mg, Oral, Q4H PRN, 650 mg at 09/21/19 1608 **OR** acetaminophen (TYLENOL) 160 MG/5ML solution 650 mg, 650 mg, Per Tube, Q4H PRN **OR** acetaminophen (TYLENOL) suppository 650 mg, 650 mg, Rectal, Q4H PRN, Amie Portland, MD .  amLODipine (NORVASC) tablet 10 mg, 10 mg, Oral, Daily, Rosalin Hawking, MD, 10 mg at 09/26/19 1006 .  aspirin tablet 325 mg, 325 mg, Oral, Daily, Louk, Alexandra M, PA-C, 325 mg at 09/26/19 1006 .  atorvastatin (LIPITOR) tablet 40 mg, 40 mg, Oral, Daily, Rosalin Hawking, MD, 40 mg at 09/26/19 1006 .  cabergoline (DOSTINEX) tablet 0.5 mg, 0.5 mg, Oral, Once per day on Wed Sat, Xu, Jindong, MD, 0.5 mg at 09/25/19 1012 .  Chlorhexidine Gluconate Cloth 2 % PADS 6 each, 6 each, Topical, Daily, Amie Portland, MD, 6 each at 09/26/19 1007 .  clevidipine (CLEVIPREX) infusion 0.5 mg/mL, 0-32 mg/hr, Intravenous, Continuous, Garvin Fila, MD, Last Rate: 14 mL/hr at 09/26/19 1200, 7 mg/hr at 09/26/19 1200 .  cloNIDine (CATAPRES - Dosed in mg/24 hr) patch 0.3 mg, 0.3 mg, Transdermal, Q7 days, Biby, Sharon L, NP, 0.3 mg at 09/24/19 1117 .  hydrALAZINE (APRESOLINE) injection 20 mg, 20 mg,  Intravenous, Q6H PRN, Garvin Fila, MD, 20 mg at 09/25/19 1351 .  hydrALAZINE (APRESOLINE) tablet 100 mg, 100 mg, Oral, TID, Rosalin Hawking, MD, 100 mg at 09/26/19 1007 .  hydrochlorothiazide (HYDRODIURIL) tablet 25 mg, 25 mg, Oral, Daily, Rosalin Hawking, MD, 25 mg at 09/26/19 1007 .  iohexol (OMNIPAQUE) 240 MG/ML injection 150 mL, 150 mL, Intra-arterial, Once PRN, de Sindy Messing, Erven Colla, MD .  iohexol (OMNIPAQUE) 240 MG/ML injection 150 mL, 150 mL, Intra-arterial, Once PRN, de Sindy Messing, Alpha, MD .  labetalol (NORMODYNE) injection 10 mg, 10 mg, Intravenous, Q2H PRN, Amie Portland, MD, 10 mg at 09/22/19 2354 .  levothyroxine (SYNTHROID) tablet 100 mcg, 100 mcg, Oral, Q0600, Amie Portland, MD, 100 mcg at 09/26/19 0639 .  losartan (COZAAR) tablet 100 mg, 100 mg, Oral, Daily, Rosalin Hawking, MD, 100 mg at 09/26/19 1006 .  ondansetron (ZOFRAN) injection 4 mg, 4 mg, Intravenous, Q6H PRN, Amie Portland, MD, 4 mg at 09/20/19 1151 .  pantoprazole (PROTONIX) EC tablet 40 mg, 40 mg, Oral, Daily, Rosalin Hawking, MD, 40 mg at 09/26/19 1006 .  polyethylene glycol (MIRALAX / GLYCOLAX) packet 17 g, 17 g, Oral, Daily, Garvin Fila, MD, 17 g at 09/25/19 1638 .  senna-docusate (Senokot-S) tablet 1 tablet, 1 tablet, Oral, BID, Amie Portland, MD, 1 tablet at 09/25/19 2203 .  spironolactone (ALDACTONE) tablet 50 mg, 50 mg, Oral, Daily, Garvin Fila, MD, 50 mg at 09/26/19 1010    Vital Signs: BP (!) 167/82 (BP Location: Left Arm)   Pulse 68   Temp (!) 97.5 F (  36.4 C) (Oral)   Resp 12   Ht 5\' 11"  (1.803 m)   Wt (!) 189.8 kg   SpO2 98%   BMI 58.36 kg/m  Physical Exam Vitals and nursing note reviewed.  Constitutional:      General: He is not in acute distress. Pulmonary:     Effort: Pulmonary effort is normal. No respiratory distress.  Skin:    General: Skin is warm and dry.  Neurological:     Comments: Follows simple commands. Speech and comprehension intact. PERRL bilaterally. EOMs  intact bilaterally without nystagmus or subjective diplopia. No facial asymmetry. Tongue midline. Can spontaneously move all extremities. No pronator drift.   Imaging: CT HEAD WO CONTRAST  Result Date: 09/24/2019 CLINICAL DATA:  Stroke.  Follow-up.  Lethargy. EXAM: CT HEAD WITHOUT CONTRAST TECHNIQUE: Contiguous axial images were obtained from the base of the skull through the vertex without intravenous contrast. COMPARISON:  MR head without contrast 09/20/2019. CT head without contrast 09/20/2019. 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 Liquid embolic agent within the right PICA again noted. Previously noted intraventricular hemorrhage has resolved. Progressive mass effect is noted. The frontal horns of the lateral ventricles are slightly more prominent than on the prior study. Asymmetry of the temporal horns is stable. Third ventricle is slightly more prominent. No new infarcts are present. Persistent generalized mass effect is present with effacement of the sulci. Vascular: Atherosclerotic changes are present within the cavernous internal carotid arteries without a hyperdense vessel. Liquid embolic material is noted as above. Skull: Calvarium is intact. No focal lytic or blastic lesions are present. No significant extracranial soft tissue lesion is present. Sinuses/Orbits: The paranasal sinuses and mastoid air cells are clear. The globes and orbits are within normal limits. IMPRESSION: 1. Evolving nonhemorrhagic cerebellar infarcts, right greater than left. 2. Liquid embolic material within the right PICA. 3. Progressive mass effect with effacement of the sulci in the posterior fossa and supratentorial structures. 4. Slight increase in size of the frontal horns of the lateral ventricles and third ventricle. Asymmetry of the temporal horns is stable. Question slight hydrocephalus. 5. No new infarcts. Electronically Signed   By: San Morelle M.D.   On: 09/24/2019 11:26   IR IVC FILTER PLMT / S&I Burke Keels GUID/MOD SED  Result Date: 09/25/2019 CLINICAL DATA:  Lower extremity DVT. Recurrent acute intracranial hemorrhage, a relative contraindication to anticoagulation. Caval filtration requested. EXAM: INFERIOR VENACAVOGRAM IVC FILTER PLACEMENT UNDER FLUOROSCOPY FLUOROSCOPY TIME:  96 seconds; 258 mGy TECHNIQUE: The procedure, risks (including but not limited to bleeding, infection, organ damage ), benefits, and alternatives were explained to the patient. Questions regarding the procedure were encouraged and answered. The patient understands and consents to the procedure. Patency of the right IJ vein was confirmed with ultrasound with image documentation. An appropriate skin site was determined. Skin site was marked, prepped with chlorhexidine, and draped using maximum barrier technique. The region was infiltrated locally with 1% lidocaine. Intravenous Fentanyl 12.61mcg and Versed 0.5mg  were administered as conscious sedation during continuous monitoring of the patient's level of consciousness and physiological / cardiorespiratory status by the radiology RN, with a total moderate sedation time of 14 minutes. Under real-time ultrasound guidance, the right IJ vein was accessed with a 21 gauge micropuncture needle; the needle tip within the vein was confirmed with ultrasound image documentation. The needle was exchanged over a 018 guidewire for a transitional dilator, which allow advancement of the [facility] wire into the IVC. A long  6 [place] vascular sheath was placed for inferior venacavography. This demonstrated no caval thrombus. Renal vein inflows were evident. The [facility] IVC filter was advanced through the sheath and successfully deployed under fluoroscopy at the L2 level. Followup cavagram demonstrates stable filter position and no evident complication. The sheath was removed and hemostasis achieved at the site. No immediate complication. IMPRESSION:  1. Normal IVC. No thrombus or significant anatomic variation. 2. Technically successful infrarenal IVC filter placement. This is a retrievable model. PLAN: This IVC filter is potentially retrievable. The patient will be assessed for filter retrieval by Interventional Radiology in approximately 8-12 weeks. Further recommendations regarding filter retrieval, continued surveillance or declaration of device permanence, will be made at that time. Electronically Signed   By: Lucrezia Europe M.D.   On: 09/25/2019 13:21   VAS [place] LOWER EXTREMITY VENOUS (DVT)  Result Date: 09/24/2019  Lower Venous DVTStudy Indications: Stroke.  Limitations: Body habitus. Performing Technologist: June Leap RDMS, RVT  Examination Guidelines: A complete evaluation includes B-mode imaging, spectral Doppler, color Doppler, and power Doppler as needed of all accessible portions of each vessel. Bilateral testing is considered an integral part of a complete examination. Limited examinations for reoccurring indications may be performed as noted. The reflux portion of the exam is performed with the patient in reverse Trendelenburg.  +---------+---------------+---------+-----------+----------+--------------+ RIGHT    CompressibilityPhasicitySpontaneityPropertiesThrombus Aging +---------+---------------+---------+-----------+----------+--------------+ CFV      Full           Yes      Yes                                 +---------+---------------+---------+-----------+----------+--------------+ SFJ      Full                                                        +---------+---------------+---------+-----------+----------+--------------+ FV Prox  Full                                                        +---------+---------------+---------+-----------+----------+--------------+ FV Mid   Full                                                        +---------+---------------+---------+-----------+----------+--------------+  FV DistalFull                                                        +---------+---------------+---------+-----------+----------+--------------+ PFV      Full                                                        +---------+---------------+---------+-----------+----------+--------------+ POP  Full           Yes      Yes                                 +---------+---------------+---------+-----------+----------+--------------+ PTV      Full                                                        +---------+---------------+---------+-----------+----------+--------------+ PERO     Full                                                        +---------+---------------+---------+-----------+----------+--------------+   +---------+---------------+---------+-----------+----------+--------------+ LEFT     CompressibilityPhasicitySpontaneityPropertiesThrombus Aging +---------+---------------+---------+-----------+----------+--------------+ CFV      Full           Yes      Yes                                 +---------+---------------+---------+-----------+----------+--------------+ SFJ      Full                                                        +---------+---------------+---------+-----------+----------+--------------+ FV Prox  Full                                                        +---------+---------------+---------+-----------+----------+--------------+ FV Mid   Full                                                        +---------+---------------+---------+-----------+----------+--------------+ FV DistalFull                                                        +---------+---------------+---------+-----------+----------+--------------+ PFV      Full                                                        +---------+---------------+---------+-----------+----------+--------------+ POP      Full           Yes      Yes                                  +---------+---------------+---------+-----------+----------+--------------+  PTV      None                                         Acute          +---------+---------------+---------+-----------+----------+--------------+ PERO     Full                                                        +---------+---------------+---------+-----------+----------+--------------+     Summary: RIGHT: - There is no evidence of deep vein thrombosis in the lower extremity.  LEFT: - Findings consistent with acute deep vein thrombosis involving the left posterior tibial veins.  *See table(s) above for measurements and observations. Electronically signed by Deitra Mayo MD on 09/24/2019 at 4:05:02 PM.    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 Karenann Cai. Patient's condition stable- still lethargic but neurologically intact,asymptomatic at this time. For D/C to CIR pending bed assignment; for IVCF today in IR due to acute DVT. Continuetaking Aspirin 325 mg once daily. Plan for follow-up with CTA head/neck (with contrast) followed by consult with Dr. Karenann Cai 3 months after discharge- our schedulers  to call patient to set up this appointment. Further plans per neurology- appreciate and agree with management. NIR to follow.   Electronically Signed: Ascencion Dike, PA-C 09/26/2019, 12:53 PM   I spent a total of 25 Minutes at the the patient's bedside AND on the patient's hospital floor or unit, greater than 50% of which was counseling/coordinating care for dissecting right PICA aneurysm s/p embolization.

## 2019-09-26 NOTE — Progress Notes (Signed)
Physical Therapy Treatment Patient Details Name: Andrew Perez MRN: [id number] DOB: [birth year] Today's Date: 09/26/2019    History of Present Illness 54 yo male admitted with IVH of 4th ventricle  PMH HTN  morbid obese previous IVH 2018. 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     PT Comments    Pt tolerated treatment well although gait progression limited by pt having an explosive bowel movement while attempting to ambulate in the room. Pt continues to demonstrate increased sway and slowed gait speed with limited mobility. Pt also continues to require assistance and cueing to power up and steady during transfers. Pt will continue to benefit from PT POC to improve activity tolerance and reduce falls risk. PT continues to recommend CIR at this time.  Follow Up Recommendations  CIR     Equipment Recommendations   (bariatric RW)    Recommendations for Other Services       Precautions / Restrictions Precautions Precautions: Fall;Other (comment) Precaution Comments: L sided weekness, obese, bilat LE edema-wears compression stocking Restrictions Weight Bearing Restrictions: No    Mobility  Bed Mobility Overal bed mobility: Needs Assistance Bed Mobility: Supine to Sit     Supine to sit: Supervision;HOB elevated        Transfers Overall transfer level: Needs assistance Equipment used: Rolling walker (2 wheeled) (bariatric) Transfers: Sit to/from Stand Sit to Stand: Min assist;From elevated surface         General transfer comment: PT provides cues for hand placement  Ambulation/Gait Ambulation/Gait assistance: Min assist Gait Distance (Feet): 6 Feet Assistive device: Rolling walker (2 wheeled) (bariatric RW) Gait Pattern/deviations: Step-to pattern;Wide base of support Gait velocity: reduced Gait velocity interpretation: <1.8 ft/sec, indicate of risk for recurrent falls General Gait  Details: pt with short step to gait with widened BOS, ambulation distance cut short by explosive bowel movement   Stairs             Wheelchair Mobility    Modified Rankin (Stroke Patients Only) Modified Rankin (Stroke Patients Only) Pre-Morbid Rankin Score: No symptoms Modified Rankin: Moderately severe disability     Balance Overall balance assessment: Needs assistance Sitting-balance support: Single extremity supported;Feet supported Sitting balance-Leahy Scale: Fair Sitting balance - Comments: close supervision at edge of bed for static sitting   Standing balance support: Bilateral upper extremity supported Standing balance-Leahy Scale: Fair Standing balance comment: minG with BUE support of RW                            Cognition Arousal/Alertness: Awake/alert Behavior During Therapy: Flat affect Overall Cognitive Status: Impaired/Different from baseline                     Current Attention Level: Sustained Memory: Decreased recall of precautions;Decreased short-term memory Following Commands: Follows one step commands consistently Safety/Judgement: Decreased awareness of safety;Decreased awareness of deficits Awareness: Intellectual Problem Solving: Slow processing;Requires verbal cues        Exercises      General Comments General comments (skin integrity, edema, etc.): VSS on RA      Pertinent Vitals/Pain Pain Assessment: No/denies pain    Home Living                      Prior Function            PT Goals (current goals can  now be found in the care plan section) Acute Rehab PT Goals Patient Stated Goal: to be more independent Progress towards PT goals: Progressing toward goals (slowly, limited by bowel movements)    Frequency    Min 4X/week      PT Plan Current plan remains appropriate    Co-evaluation              AM-PAC PT "6 Clicks" Mobility   Outcome Measure  Help needed turning from your  back to your side while in a flat bed without using bedrails?: A Little Help needed moving from lying on your back to sitting on the side of a flat bed without using bedrails?: None Help needed moving to and from a bed to a chair (including a wheelchair)?: A Little Help needed standing up from a chair using your arms (e.g., wheelchair or bedside chair)?: A Little Help needed to walk in hospital room?: A Lot Help needed climbing 3-5 steps with a railing? : Total 6 Click Score: 16    End of Session   Activity Tolerance: Patient tolerated treatment well Patient left: in chair;with call bell/phone within reach;with chair alarm set Nurse Communication: Mobility status PT Visit Diagnosis: Unsteadiness on feet (R26.81);Other abnormalities of gait and mobility (R26.89);Other symptoms and signs involving the nervous system (R29.898)     Time: 3338-3291 PT Time Calculation (min) (ACUTE ONLY): 33 min  Charges:  $Gait Training: 8-22 mins $Therapeutic Activity: 8-22 mins                     Zenaida Niece, PT, DPT Acute Rehabilitation Pager: [phone]    Zenaida Niece 09/26/2019, 5:57 PM

## 2019-09-26 NOTE — PMR Pre-admission (Signed)
PMR Admission Coordinator Pre-Admission Assessment  Patient: Andrew Perez is an 54 y.o., male MRN: [id number] DOB: 09-05-1965 Height: 5' 11" (180.3 cm) Weight: (!) 189.8 kg              Insurance Information HMO:     PPO: yes     PCP:      IPA:      80/20:      OTHER:  PRIMARY: [employer]      Policy#: [id number]      Subscriber: patient CM Name: Butch Penny       Phone#: [phone] [id number]     Fax#: [phone] Pre-Cert#: [id number]      Employer:  Josem Kaufmann provided by Butch Penny for admit to CIR with admit date 09/27/19. The case is certified through 6/44/03. Clinical update is due by 10/04/19 to Oscar La (p): [phone] (f): [phone] Benefits:  Phone #: [phone]     Name:  Eff. Date: 04/19/2007-04/17/2020     Deduct: $1,500 ($0 met)      Out of Pocket Max: $6,000 ($283.16 met)      Life Max: NA  CIR: 70% coverage, 30% co-insurance      SNF: 70% coverage, 30% co-insurance; limited to 60 days per cal yr.  Outpatient: 70% coverage, 30% co-insurance; limited to 30 visits/cal yr combined PT/OT/ST   Home Health: 70% coverage, 30% co-insurance; limited to 60 visits per cal yr.      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 male with history of HTN, morbid obesity--BMI 34, IVH-'18, OSA, prolactinoma who was admitted on 09/17/19 with acute onset of HA  with dizziness and episode of vomiting. 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 Bleed felt to be hypertensive in nature. He was started on cleviprex for BP management and Dr. Ronnald Ramp consulted for input. CT angio recommended for work up with SBP<140 and NS signed off as no hydrocephalus noted.  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 Katherina Right on 06/03. Post procedure with nausea and Repeat CT head showed right more than left cerebellar infarct and MRI confirmed evolving R>L PIC territory infarct --similar in extension c/w prior CT.   2D echo showed EF 55-60% and moderate LVH and moderately dilated left atrium. Now on ASA for secondary stroke prevention. BP remains labile requiring Cleviprex. CIR recommended due to balance deficits affecting functional status. Pt is to admit to CIR on 09/27/19  Complete NIHSS TOTAL: 2 Glasgow Coma Scale Score: 15  Past Medical History  Past Medical History:  Diagnosis Date  . Hypertension   . IVH (intraventricular hemorrhage) ([place]) 2018   due to HTN  . Prolactinoma ([place])  followed by [facility]  . Sleep apnea     Family History  family history includes Colon cancer in his father.  Prior Rehab/Hospitalizations:  Has the patient had prior rehab or hospitalizations prior to admission? No  Has the patient had major surgery during 100 days prior to admission? Yes  Current Medications   Current Facility-Administered Medications:  .  acetaminophen (TYLENOL) tablet 650 mg, 650 mg, Oral, Q4H PRN, 650 mg at 09/21/19 1608 **OR** acetaminophen (TYLENOL) 160 MG/5ML solution 650 mg, 650 mg, Per Tube, Q4H PRN **OR** acetaminophen (TYLENOL) suppository 650 mg, 650 mg, Rectal, Q4H PRN, Amie Portland, MD .  amLODipine (NORVASC) tablet 10 mg, 10 mg, Oral, Daily, Rosalin Hawking, MD,  10 mg at 09/27/19 0914 .  aspirin tablet 325 mg, 325 mg, Oral, Daily, Louk, Alexandra M, PA-C, 325 mg at 09/27/19 0914 .  atorvastatin (LIPITOR) tablet 40 mg, 40 mg, Oral, Daily, Rosalin Hawking, MD, 40 mg at 09/27/19 0914 .  cabergoline (DOSTINEX) tablet 0.5 mg, 0.5 mg, Oral, Once per day on Wed Sat, Xu, Jindong, MD, 0.5 mg at 09/25/19 1012 .  Chlorhexidine Gluconate Cloth 2 % PADS 6 each, 6 each, Topical, Daily, Amie Portland, MD, 6 each at 09/26/19 1007 .  cloNIDine (CATAPRES - Dosed in mg/24 hr) patch 0.3 mg, 0.3 mg, Transdermal, Q7 days, Biby, Sharon L, NP, 0.3 mg at 09/24/19 1117 .  hydrALAZINE (APRESOLINE) injection 20 mg, 20 mg, Intravenous, Q6H PRN, Garvin Fila, MD, 20 mg at 09/25/19 1351 .  hydrALAZINE (APRESOLINE) tablet 100 mg, 100 mg, Oral, TID, Rosalin Hawking, MD, 100 mg at 09/27/19 0914 .  hydrochlorothiazide (HYDRODIURIL) tablet 25 mg, 25 mg, Oral, Daily, Rosalin Hawking, MD, 25 mg at 09/27/19 0914 .  iohexol (OMNIPAQUE) 240 MG/ML injection 150 mL, 150 mL, Intra-arterial, Once PRN, de Sindy Messing, Erven Colla, MD .  iohexol (OMNIPAQUE) 240 MG/ML injection 150 mL, 150 mL, Intra-arterial, Once PRN, de Sindy Messing, Erven Colla, MD .  irbesartan (AVAPRO) tablet 150 mg, 150 mg, Oral, BID, Leonie Man, Pramod S, MD, 150 mg at 09/27/19 0914 .  labetalol (NORMODYNE) injection 10 mg, 10 mg, Intravenous, Q2H PRN, Amie Portland, MD, 10 mg at 09/22/19 2354 .  levothyroxine (SYNTHROID) tablet 100 mcg, 100 mcg, Oral, Q0600, Amie Portland, MD, 100 mcg at 09/27/19 0708 .  ondansetron (ZOFRAN) injection 4 mg, 4 mg, Intravenous, Q6H PRN, Amie Portland, MD, 4 mg at 09/20/19 1151 .  pantoprazole (PROTONIX) EC tablet 40 mg, 40 mg, Oral, Daily, Rosalin Hawking, MD, 40 mg at 09/27/19 0914 .  polyethylene glycol (MIRALAX / GLYCOLAX) packet 17 g, 17 g, Oral, Daily, Garvin Fila, MD, 17 g at 09/25/19 1638 .  senna-docusate (Senokot-S) tablet 1 tablet, 1 tablet, Oral, BID, Amie Portland, MD, 1 tablet at 09/25/19 2203 .   spironolactone (ALDACTONE) tablet 50 mg, 50 mg, Oral, Daily, Garvin Fila, MD, 50 mg at 09/27/19 3614  Patients Current Diet:  Diet Order            Diet regular Room service appropriate? Yes with Assist; Fluid consistency: Thin  Diet effective now                 Precautions / Restrictions Precautions Precautions: Fall, Other (comment) Precaution Comments: L sided weekness, obese, bilat LE edema-wears compression stocking Restrictions Weight Bearing Restrictions: No   Has the patient had 2 or more falls or a fall with injury in the past year?No  Prior Activity Level Community (5-7x/wk): drives, works 6 days/week  Prior  Functional Level Prior Function Level of Independence: Independent Comments: works as a Research officer, political party Care: Did the patient need help bathing, dressing, using the toilet or eating?  Independent  Indoor Mobility: Did the patient need assistance with walking from room to room (with or without device)? Independent  Stairs: Did the patient need assistance with internal or external stairs (with or without device)? Independent  Functional Cognition: Did the patient need help planning regular tasks such as shopping or remembering to take medications? Independent  Home Assistive Devices / Equipment Home Equipment: Environmental consultant - 2 wheels, Bedside commode, Wheelchair - manual, Shower seat  Prior Device Use: Indicate devices/aids used by the patient prior to current illness, exacerbation or injury? None of the above  Current Functional Level Cognition  Overall Cognitive Status: Impaired/Different from baseline Current Attention Level: Sustained Orientation Level: Oriented X4 Following Commands: Follows one step commands consistently Safety/Judgement: Decreased awareness of safety, Decreased awareness of deficits General Comments: pt delayed response requiring freq verbal cues to stay on task and complete task    Extremity Assessment (includes  Sensation/Coordination)  Upper Extremity Assessment: RUE deficits/detail  Lower Extremity Assessment: Defer to PT evaluation, RLE deficits/detail, LLE deficits/detail RLE Deficits / Details: edema noted in feet LLE Deficits / Details: edema noted    ADLs  Overall ADL's : Needs assistance/impaired Eating/Feeding: Minimal assistance Eating/Feeding Details (indicate cue type and reason): poor depth perception; unable to locate utensils on tray without VC; able to complete hand to mouth pattern however pt's wife has been feeding him. Educated wife on need for pt to self feed as much as possible Grooming: Minimal assistance, Sitting Upper Body Bathing: Minimal assistance, Sitting Lower Body Bathing: Total assistance Toilet Transfer: +2 for physical assistance, Moderate assistance, Stand-pivot, RW Toilet Transfer Details (indicate cue type and reason): simulated OOB to chair Toileting - Clothing Manipulation Details (indicate cue type and reason): total Functional mobility during ADLs: +2 for physical assistance, Moderate assistance, Rolling walker General ADL Comments: pt with decrease balance with RW but reports "its fine"    Mobility  Overal bed mobility: Needs Assistance Bed Mobility: Supine to Sit Supine to sit: Supervision, HOB elevated Sit to supine: Max assist, +2 for physical assistance (for LE management and trunk control descending) General bed mobility comments: HOB elevated, increased time, minA for trunk elevation and to scoot hips to EOB    Transfers  Overall transfer level: Needs assistance Equipment used: Rolling walker (2 wheeled) (bariatric) Transfers: Sit to/from Stand Sit to Stand: Min assist, From elevated surface General transfer comment: PT provides cues for hand placement    Ambulation / Gait / Stairs / Wheelchair Mobility  Ambulation/Gait Ambulation/Gait assistance: Herbalist (Feet): 6 Feet Assistive device: Rolling walker (2 wheeled) (bariatric  RW) Gait Pattern/deviations: Step-to pattern, Wide base of support General Gait Details: pt with short step to gait with widened BOS, ambulation distance cut short by explosive bowel movement Gait velocity: reduced Gait velocity interpretation: <1.8 ft/sec, indicate of risk for recurrent falls    Posture / Balance Dynamic Sitting Balance Sitting balance - Comments: close supervision at edge of bed for static sitting Balance Overall balance assessment: Needs assistance Sitting-balance support: Single extremity supported, Feet supported Sitting balance-Leahy Scale: Fair Sitting balance - Comments: close supervision at edge of bed for static sitting Standing balance support: Bilateral upper extremity supported Standing balance-Leahy Scale: Fair Standing balance comment: minG with BUE support of RW    Special needs/care consideration CPAP, Continuous Drip IV: no, Oxygen: yes,  Skin: skin tear to medial nose; surgical incision to right IJ,  and Designated visitor: Seth Bake (wife)     Previous Environmental health practitioner (from acute therapy documentation) Living Arrangements: Spouse/significant other  Lives With: Spouse, Son Available Help at Discharge: Family Type of Home: House Home Layout: One level Home Access: Stairs to enter Entrance Stairs-Rails: Right, Left, Can reach both Entrance Stairs-Number of Steps: 4-5 Bathroom Shower/Tub: Multimedia programmer: Handicapped height Bathroom Accessibility: Yes How Accessible: Accessible via walker [place]: No  Discharge Living Setting Plans for Discharge Living Setting: Patient's home Type of Home at Discharge: House Discharge Home Layout: One level Discharge Home Access: Stairs to enter Entrance Stairs-Rails: Right, Left, Can reach both Entrance Stairs-Number of Steps: 4-5 Discharge Bathroom Shower/Tub: Walk-in shower Discharge Bathroom Toilet: Handicapped height Discharge Bathroom Accessibility: Yes How Accessible:  Accessible via walker Does the patient have any problems obtaining your medications?: No  Social/Family/Support Systems Patient Roles: Spouse Anticipated Caregiver: Larry Sierras, wife Anticipated Caregiver's Contact Information: [phone] Ability/Limitations of Caregiver: Min A Caregiver Availability: 24/7 Discharge Plan Discussed with Primary Caregiver: Yes Is Caregiver In Agreement with Plan?: Yes Does Caregiver/Family have Issues with Lodging/Transportation while Pt is in Rehab?: No   Goals Patient/Family Goal for Rehab: Mod I-supervision: PT/OT, Mod I: ST Expected length of stay: 13-16 days Pt/Family Agrees to Admission and willing to participate: Yes Program Orientation Provided & Reviewed with Pt/Caregiver Including Roles  & Responsibilities: Yes  Barriers to Discharge: Home environment access/layout, New oxygen  Barriers to Discharge Comments: steps to enter, new O2 demands   Decrease burden of Care through IP rehab admission: NA   Possible need for SNF placement upon discharge:Not anticipated. Pt has great family support from his wife and he has an excellent prognosis for further progress through CIR.    Patient Condition: This patient's medical and functional status has changed since the consult dated: 09/23/19 in which the Rehabilitation Physician determined and documented that the patient's condition is appropriate for intensive rehabilitative care in an inpatient rehabilitation facility. See "History of Present Illness" (above) for medical update. Functional changes are: improvement in transfers from Min A +2 to Min A, improvement in gait from Min A +2 for 12 feet to Min A for 6 feet. Patient's medical and functional status update has been discussed with the Rehabilitation physician and patient remains appropriate for inpatient rehabilitation. Will admit to inpatient rehab today.  Preadmission Screen Completed By:  Raechel Ache, OT, 09/27/2019 12:05  PM ______________________________________________________________________   Discussed status with Dr. Naaman Plummer on 6/11/21at 12:05PM and received approval for admission today.  Admission Coordinator:  Raechel Ache, time 12:05PM/Date 09/27/19

## 2019-09-26 NOTE — Progress Notes (Signed)
STROKE TEAM PROGRESS NOTE   INTERVAL HISTORY  He is awake alert and interactive.  Neurologically stable.  Blood pressure still requiring IV Cleviprex drip.  He had IVC filter placed yesterday.  No new complaints. Vitals:   09/26/19 1130 09/26/19 1200 09/26/19 1300 09/26/19 1400  BP: (!) 163/69 (!) 167/82 (!) 141/72 (!) 142/70  Pulse: 64 68 64 63  Resp: 14 12 14 14   Temp:  (!) 97.5 F (36.4 C)    TempSrc:  Oral    SpO2: 100% 98% 97% 100%  Weight:      Height:       CBC:  Recent Labs  Lab 09/22/19 0355 09/23/19 1251  WBC 13.5* 11.5*  HGB 13.3 12.2*  HCT 39.3 35.6*  MCV 87.7 85.8  PLT 268 834   Basic Metabolic Panel:  Recent Labs  Lab 09/22/19 0355 09/23/19 1251  NA 133* 131*  K 4.3 4.1  CL 98 96*  CO2 23 25  GLUCOSE 128* 115*  BUN 15 17  CREATININE 0.84 0.80  CALCIUM 9.0 8.8*   Lipid Panel:     Component Value Date/Time   CHOL 159 09/19/2019 0515   TRIG 100 09/19/2019 0515   HDL 35 (L) 09/19/2019 0515   CHOLHDL 4.5 09/19/2019 0515   VLDL 20 09/19/2019 0515   LDLCALC 104 (H) 09/19/2019 0515   HgbA1c:  Lab Results  Component Value Date   HGBA1C 4.7 (L) 09/18/2019   Urine Drug Screen:     Component Value Date/Time   LABOPIA POSITIVE (A) 09/17/2019 2126   COCAINSCRNUR NONE DETECTED 09/17/2019 2126   LABBENZ NONE DETECTED 09/17/2019 2126   AMPHETMU NONE DETECTED 09/17/2019 2126   THCU NONE DETECTED 09/17/2019 2126   LABBARB NONE DETECTED 09/17/2019 2126    Alcohol Level No results found for: ETH  IMAGING past 24 hours No results found.   PHYSICAL EXAM   General -morbidly obese middle-aged Caucasian male well developed, not in acute distress.  Ophthalmologic - fundi not visualized due to noncooperation.  Cardiovascular - Regular rhythm and rate.  Neuro - awake alert and oriented to time place and person.. No aphasia, but paucity of speech, no dysarthria. Able to name and repeat. PERRL, EOMI, no nystagmus on bilateral gaze but saccadic dysmetria on  left lateral gaze..  Facial symmetrical. visual field full. Tongue midline. Moving all extremities equally but lower extremities less than upper extremities. B/l FTN intact no ataxia. Sensation intact, gait not tested.    ASSESSMENT/PLAN Andrew Perez is a 54 y.o. male with history of hypertension, intraventricular hemorrhage involving the fourth ventricle in 2018, prolactinoma followed by Dr. Maurie Boettcher at [facility], an endocrinologist at [facility], on cabergoline therapy March 2018 with decrease in mass size on follow-up imaging being followed by surveillance with MRIs, morbid obesity, presenting with dizziness w/ episode of vomiting followed by headache. SBP > 190.  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 small volume IVH in 4th ventricle. Interval resection previous central skull base infiltrative mass. 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 No hydrocephalus. Known sellar mass w/ skull base erosion.   Cerebral angio right PICA 60mm aneurysm   MRI - Evolving bilateral PICA territory infarct, similar in extension to prior CT. There is no hydrocephalus.   Repeat CT head 6/8 /21 evolving nonhemorrhagic cerebral infarcts right greater than left*   2D Echo EF  55-60%. No source of embolus. LA moderate dilated  LDL 104  HgbA1c 4.7  UDS + opiates  SCDs for VTE prophylaxis  No antithrombotic prior to admission, was on No antithrombotic given hemorrhage. Post aneurysm embolization, now off heparin IV, on ASA 325.   Therapy recommendations:  CIR  Disposition:  Pending  Right PICA aneurysm  Cerebral angiogram showed right PICA 5 mm aneurysm  Aneurysm management with right PICA embolization 6/3  Expecting small right cerebellar infarct  CT bilateral cerebellar infarct, right more than left  MRI confirmed bilateral PICA infarcts, right more than left.  No hydrocephalus  Off  heparin IV, on aspirin 325  History of IVH  2018 - admitted for headache, MRI and CT showed IVH involving the 4th ventricle, and old right BG infarct.  Carotid Doppler negative.  EF 55 to 60%.  LDL 27 and A1c 4.6.  CTA head at that time showed right PICA patent.  Prolactinoma    Followed by Dr. Maurie Boettcher and [facility] at [facility]  On cabergoline therapy since March 2018 with decrease in mass size on follow-up imaging  Being followed by surveillance with MRIs  Cabergoline every Wed and Sat  Hypertensive Emergency  SBP > 190 on arrival   Home meds:  amlodipine 10, hydralazine 100 q8, losartan 100  Now still on cleviprex   Resumed home medication  Add HCTZ 50 and clonidine 0.1 tid -> 0.2 tid . SBP goal <160 (SBP 141 - 160 today) . Long-term BP goal normotensive  Hyperlipidemia  LDL 104  on no statin PTA  Add lipitor 40 in the setting of stroke  Continue statin on discharge.  Other Stroke Risk Factors  Morbid Obesity, Body mass index is 58.36 kg/m., recommend weight loss, diet and exercise as appropriate   Obstructive sleep apnea  Other Active Problems  Hypothyroid on synthroid  Leukocytosis - 14.6->14.4 (afebrile)->13.5  Mild hyponatremia - 133->133  Hospital day # 9  [name]. Continue clinidine patch, HCTZ and increase spironolactone.  And switch losartan to more potent ARB twice daily and hopefully get him off the Cleviprex drip.  IVC filter placed y`day by IR  Long discussion with patient and wife at the bedside and answered questions. This patient is critically ill and at significant risk of neurological worsening, death and care requires constant monitoring of vital signs, hemodynamics,respiratory and cardiac monitoring, extensive review of multiple databases, frequent neurological assessment, discussion with family, other specialists and medical decision making  of high complexity.I have made any additions or clarifications directly to the above note.This critical care time does not reflect procedure time, or teaching time or supervisory time of PA/NP/Med Resident etc but could involve care discussion time.  I spent 30 minutes of neurocritical care time  in the care of  this patient.      Antony Contras, MD Medical Director [facility] Pager: [phone] 09/26/2019 2:49 PM  To contact Stroke Continuity provider, please refer to [email or web address]  After hours, contact General Neurology

## 2019-09-27 ENCOUNTER — Inpatient Hospital Stay (HOSPITAL_COMMUNITY)
Admission: RE | Admit: 2019-09-27 | Discharge: 2019-10-04 | DRG: 057 | Disposition: A | Discharge status: Home / Self Care | Payer: 59 | Source: Intra-hospital | Attending: Physical Medicine & Rehabilitation | Admitting: Physical Medicine & Rehabilitation

## 2019-09-27 ENCOUNTER — Encounter (HOSPITAL_COMMUNITY): Payer: Self-pay | Admitting: Physical Medicine & Rehabilitation

## 2019-09-27 DIAGNOSIS — Z79890 Hormone replacement therapy (postmenopausal): Secondary | ICD-10-CM | POA: Diagnosis not present

## 2019-09-27 DIAGNOSIS — D72829 Elevated white blood cell count, unspecified: Secondary | ICD-10-CM | POA: Diagnosis present

## 2019-09-27 DIAGNOSIS — I615 Nontraumatic intracerebral hemorrhage, intraventricular: Secondary | ICD-10-CM | POA: Diagnosis not present

## 2019-09-27 DIAGNOSIS — I619 Nontraumatic intracerebral hemorrhage, unspecified: Secondary | ICD-10-CM | POA: Diagnosis present

## 2019-09-27 DIAGNOSIS — R001 Bradycardia, unspecified: Secondary | ICD-10-CM | POA: Diagnosis present

## 2019-09-27 DIAGNOSIS — I639 Cerebral infarction, unspecified: Secondary | ICD-10-CM | POA: Diagnosis not present

## 2019-09-27 DIAGNOSIS — I10 Essential (primary) hypertension: Secondary | ICD-10-CM

## 2019-09-27 DIAGNOSIS — R0902 Hypoxemia: Secondary | ICD-10-CM | POA: Diagnosis present

## 2019-09-27 DIAGNOSIS — Z713 Dietary counseling and surveillance: Secondary | ICD-10-CM

## 2019-09-27 DIAGNOSIS — Z885 Allergy status to narcotic agent status: Secondary | ICD-10-CM

## 2019-09-27 DIAGNOSIS — D352 Benign neoplasm of pituitary gland: Secondary | ICD-10-CM | POA: Diagnosis present

## 2019-09-27 DIAGNOSIS — I82409 Acute embolism and thrombosis of unspecified deep veins of unspecified lower extremity: Secondary | ICD-10-CM | POA: Diagnosis not present

## 2019-09-27 DIAGNOSIS — Z86718 Personal history of other venous thrombosis and embolism: Secondary | ICD-10-CM

## 2019-09-27 DIAGNOSIS — Z79899 Other long term (current) drug therapy: Secondary | ICD-10-CM | POA: Diagnosis not present

## 2019-09-27 DIAGNOSIS — E039 Hypothyroidism, unspecified: Secondary | ICD-10-CM | POA: Diagnosis present

## 2019-09-27 DIAGNOSIS — I69118 Other symptoms and signs involving cognitive functions following nontraumatic intracerebral hemorrhage: Secondary | ICD-10-CM | POA: Diagnosis not present

## 2019-09-27 DIAGNOSIS — E785 Hyperlipidemia, unspecified: Secondary | ICD-10-CM | POA: Diagnosis present

## 2019-09-27 DIAGNOSIS — E871 Hypo-osmolality and hyponatremia: Secondary | ICD-10-CM | POA: Diagnosis present

## 2019-09-27 DIAGNOSIS — Z6841 Body Mass Index (BMI) 40.0 and over, adult: Secondary | ICD-10-CM

## 2019-09-27 DIAGNOSIS — Z91013 Allergy to seafood: Secondary | ICD-10-CM | POA: Diagnosis not present

## 2019-09-27 DIAGNOSIS — G4733 Obstructive sleep apnea (adult) (pediatric): Secondary | ICD-10-CM | POA: Diagnosis present

## 2019-09-27 DIAGNOSIS — D62 Acute posthemorrhagic anemia: Secondary | ICD-10-CM | POA: Diagnosis present

## 2019-09-27 DIAGNOSIS — I610 Nontraumatic intracerebral hemorrhage in hemisphere, subcortical: Secondary | ICD-10-CM

## 2019-09-27 DIAGNOSIS — I671 Cerebral aneurysm, nonruptured: Secondary | ICD-10-CM | POA: Diagnosis present

## 2019-09-27 DIAGNOSIS — Z95828 Presence of other vascular implants and grafts: Secondary | ICD-10-CM | POA: Diagnosis not present

## 2019-09-27 DIAGNOSIS — R7301 Impaired fasting glucose: Secondary | ICD-10-CM | POA: Diagnosis present

## 2019-09-27 DIAGNOSIS — I69154 Hemiplegia and hemiparesis following nontraumatic intracerebral hemorrhage affecting left non-dominant side: Principal | ICD-10-CM

## 2019-09-27 MED ORDER — IRBESARTAN 150 MG PO TABS: 150.0000 mg | ORAL_TABLET | Freq: Two times a day (BID) | ORAL | Status: DC

## 2019-09-27 MED ORDER — SPIRONOLACTONE 50 MG PO TABS: 50.0000 mg | ORAL_TABLET | Freq: Every day | ORAL | Status: DC

## 2019-09-27 MED ORDER — PROCHLORPERAZINE MALEATE 5 MG PO TABS
5.0000 mg | ORAL_TABLET | Freq: Four times a day (QID) | ORAL | Status: DC | PRN
  Administered 2019-09-30 – 2019-10-02 (×2): 10 mg via ORAL
  Filled 2019-09-27 (×2): qty 2

## 2019-09-27 MED ORDER — HYDRALAZINE HCL 10 MG PO TABS: 10.0000 mg | ORAL_TABLET | Freq: Four times a day (QID) | ORAL | Status: DC | PRN

## 2019-09-27 MED ORDER — HYDRALAZINE HCL 20 MG/ML IJ SOLN: 20.0000 mg | Freq: Four times a day (QID) | INTRAMUSCULAR | Status: DC | PRN

## 2019-09-27 MED ORDER — ACETAMINOPHEN 325 MG PO TABS: 325.0000 mg | ORAL_TABLET | ORAL | Status: DC | PRN

## 2019-09-27 MED ORDER — HYDROCHLOROTHIAZIDE 25 MG PO TABS: 25.0000 mg | ORAL_TABLET | Freq: Every day | ORAL | Status: DC

## 2019-09-27 MED ORDER — LEVOTHYROXINE SODIUM 100 MCG PO TABS
100.0000 ug | ORAL_TABLET | Freq: Every day | ORAL | Status: DC
  Administered 2019-09-28 – 2019-10-04 (×7): 100 ug via ORAL
  Filled 2019-09-27 (×7): qty 1

## 2019-09-27 MED ORDER — TRAZODONE HCL 50 MG PO TABS: 25.0000 mg | ORAL_TABLET | Freq: Every evening | ORAL | Status: DC | PRN

## 2019-09-27 MED ORDER — LABETALOL HCL 5 MG/ML IV SOLN: 10.0000 mg | INTRAVENOUS | Status: DC | PRN

## 2019-09-27 MED ORDER — IOHEXOL 240 MG/ML SOLN: 150.0000 mL | Freq: Once | INTRAMUSCULAR | Status: DC | PRN

## 2019-09-27 MED ORDER — PROCHLORPERAZINE EDISYLATE 10 MG/2ML IJ SOLN: 5.0000 mg | Freq: Four times a day (QID) | INTRAMUSCULAR | Status: DC | PRN

## 2019-09-27 MED ORDER — IRBESARTAN 300 MG PO TABS
150.0000 mg | ORAL_TABLET | Freq: Two times a day (BID) | ORAL | Status: DC
  Administered 2019-09-27 – 2019-10-04 (×14): 150 mg via ORAL
  Filled 2019-09-27 (×15): qty 1

## 2019-09-27 MED ORDER — ASPIRIN 325 MG PO TABS: 325.0000 mg | ORAL_TABLET | Freq: Every day | ORAL | Status: DC

## 2019-09-27 MED ORDER — POLYETHYLENE GLYCOL 3350 17 G PO PACK: 17.0000 g | PACK | Freq: Every day | ORAL | 0 refills | Status: DC

## 2019-09-27 MED ORDER — PANTOPRAZOLE SODIUM 40 MG PO TBEC
40.0000 mg | DELAYED_RELEASE_TABLET | Freq: Every day | ORAL | Status: DC
  Administered 2019-09-28 – 2019-10-04 (×7): 40 mg via ORAL
  Filled 2019-09-27 (×8): qty 1

## 2019-09-27 MED ORDER — PROCHLORPERAZINE 25 MG RE SUPP: 12.5000 mg | Freq: Four times a day (QID) | RECTAL | Status: DC | PRN

## 2019-09-27 MED ORDER — CABERGOLINE 0.5 MG PO TABS
0.5000 mg | ORAL_TABLET | ORAL | Status: DC
  Administered 2019-09-28 – 2019-10-02 (×2): 0.5 mg via ORAL
  Filled 2019-09-27 (×2): qty 1

## 2019-09-27 MED ORDER — POLYETHYLENE GLYCOL 3350 17 G PO PACK
17.0000 g | PACK | Freq: Every day | ORAL | Status: DC
  Administered 2019-09-28 – 2019-10-04 (×7): 17 g via ORAL
  Filled 2019-09-27 (×8): qty 1

## 2019-09-27 MED ORDER — HYDRALAZINE HCL 100 MG PO TABS: 100.0000 mg | ORAL_TABLET | Freq: Three times a day (TID) | ORAL | Status: DC

## 2019-09-27 MED ORDER — BISACODYL 10 MG RE SUPP: 10.0000 mg | Freq: Every day | RECTAL | Status: DC | PRN

## 2019-09-27 MED ORDER — ATORVASTATIN CALCIUM 40 MG PO TABS
40.0000 mg | ORAL_TABLET | Freq: Every day | ORAL | Status: DC
  Administered 2019-09-28 – 2019-10-04 (×7): 40 mg via ORAL
  Filled 2019-09-27 (×8): qty 1

## 2019-09-27 MED ORDER — CLONIDINE HCL 0.3 MG/24HR TD PTWK
0.3000 mg | MEDICATED_PATCH | TRANSDERMAL | Status: DC
  Administered 2019-10-01: 0.3 mg via TRANSDERMAL
  Filled 2019-09-27: qty 1

## 2019-09-27 MED ORDER — HYDROCHLOROTHIAZIDE 25 MG PO TABS
25.0000 mg | ORAL_TABLET | Freq: Every day | ORAL | Status: DC
  Administered 2019-09-28 – 2019-10-04 (×7): 25 mg via ORAL
  Filled 2019-09-27 (×8): qty 1

## 2019-09-27 MED ORDER — AMLODIPINE BESYLATE 10 MG PO TABS
10.0000 mg | ORAL_TABLET | Freq: Every day | ORAL | Status: DC
  Administered 2019-09-28 – 2019-10-04 (×7): 10 mg via ORAL
  Filled 2019-09-27 (×8): qty 1

## 2019-09-27 MED ORDER — CHLORHEXIDINE GLUCONATE CLOTH 2 % EX PADS: 6.0000 | MEDICATED_PAD | Freq: Every day | CUTANEOUS | Status: DC

## 2019-09-27 MED ORDER — FLEET ENEMA 7-19 GM/118ML RE ENEM: 1.0000 | ENEMA | Freq: Once | RECTAL | Status: DC | PRN

## 2019-09-27 MED ORDER — SENNOSIDES-DOCUSATE SODIUM 8.6-50 MG PO TABS
2.0000 | ORAL_TABLET | Freq: Every day | ORAL | Status: DC
  Administered 2019-09-28 – 2019-10-03 (×5): 2 via ORAL
  Filled 2019-09-27 (×6): qty 2

## 2019-09-27 MED ORDER — ALUM & MAG HYDROXIDE-SIMETH 200-200-20 MG/5ML PO SUSP: 30.0000 mL | ORAL | Status: DC | PRN

## 2019-09-27 MED ORDER — HYDRALAZINE HCL 50 MG PO TABS
100.0000 mg | ORAL_TABLET | Freq: Three times a day (TID) | ORAL | Status: DC
  Administered 2019-09-27 – 2019-10-04 (×20): 100 mg via ORAL
  Filled 2019-09-27 (×21): qty 2

## 2019-09-27 MED ORDER — DIPHENHYDRAMINE HCL 12.5 MG/5ML PO ELIX: 12.5000 mg | ORAL_SOLUTION | Freq: Four times a day (QID) | ORAL | Status: DC | PRN

## 2019-09-27 MED ORDER — ATORVASTATIN CALCIUM 40 MG PO TABS: 40.0000 mg | ORAL_TABLET | Freq: Every day | ORAL | Status: DC

## 2019-09-27 MED ORDER — POLYETHYLENE GLYCOL 3350 17 G PO PACK
17.0000 g | PACK | Freq: Every day | ORAL | Status: DC | PRN
  Filled 2019-09-27: qty 1

## 2019-09-27 MED ORDER — GUAIFENESIN-DM 100-10 MG/5ML PO SYRP: 5.0000 mL | ORAL_SOLUTION | Freq: Four times a day (QID) | ORAL | Status: DC | PRN

## 2019-09-27 MED ORDER — ASPIRIN 325 MG PO TABS
325.0000 mg | ORAL_TABLET | Freq: Every day | ORAL | Status: DC
  Administered 2019-09-28 – 2019-10-04 (×7): 325 mg via ORAL
  Filled 2019-09-27 (×8): qty 1

## 2019-09-27 MED ORDER — SENNOSIDES-DOCUSATE SODIUM 8.6-50 MG PO TABS: 1.0000 | ORAL_TABLET | Freq: Two times a day (BID) | ORAL | Status: DC

## 2019-09-27 MED ORDER — SPIRONOLACTONE 25 MG PO TABS
50.0000 mg | ORAL_TABLET | Freq: Every day | ORAL | Status: DC
  Administered 2019-09-28 – 2019-10-04 (×7): 50 mg via ORAL
  Filled 2019-09-27 (×8): qty 2

## 2019-09-27 NOTE — H&P (Signed)
Physical Medicine and Rehabilitation Admission H&P        Chief Complaint  Patient presents with  . Stroke with functional deficits  . 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  He was noted to be hypertensive at admission and found to have small acute IVH involving fourth ventricle.  Bleed was felt to be hypertensive in nature and he was started on Cleviprex for BP management.  Dr. Ronnald Ramp was consulted for input and recommended CT angio for work-up as well as SBP less than 140.  And has signed off and is no hydrocephalus noted.  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 Christophe Louis on 06/03.  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  MRI brain confirmed evolving infarct similar in extension compared with prior CT.  2D echo showed EF 55 to 60%, moderate LVH and moderately dilated left atrium.  Patient placed on aspirin for secondary stroke prevention.     His blood pressure continued to be labile requiring Cleviprex.  BP meds added and titrated upwards and Cleviprex weaned off yesterday.  He has had fluctuating mental status and repeat CT head 6/8 showed evolving nonhemorrhagic cerebellar infarcts right greater than left, progressive mass-effect with effacement of sulci posterior fossa/supratentorial structures, and slight increase in size of frontal horn of lateral ventricles and third ventricle. BLE Dopplers done due to LE edema  06/08 revealing DVT in left posterior tibial vein and IVC filter placed by radiology on 06/09 as not felt to be a candidate for anticoagulation.  His lethargy is resolving but he continues to be limited by  left-sided weakness with unsteady gait.  CIR was recommended due to functional decline     Review of Systems  Constitutional: Negative for chills and fever.  HENT: Negative for hearing loss and tinnitus.   Eyes: Negative for blurred vision and double vision.  Respiratory: Negative for cough and shortness of breath.   Cardiovascular: Positive for leg swelling (wears Rx compression stockings). Negative for chest pain and palpitations.  Gastrointestinal: Negative for constipation, heartburn and nausea.  Genitourinary: Negative for dysuria and urgency.  Musculoskeletal: Negative for joint pain and myalgias.  Skin: Negative for itching and rash.  Neurological: Positive for weakness. Negative for dizziness and headaches.  Psychiatric/Behavioral: The patient is not nervous/anxious and does not have insomnia.           Past Medical History:  Diagnosis Date  . Hypertension    . IVH (intraventricular hemorrhage) ([place]) 2018    due to HTN  . Prolactinoma ([place])      followed by [facility]  . Sleep apnea             Past Surgical History:  Procedure Laterality Date  . IR 3D INDEPENDENT WKST   09/19/2019  . IR ANGIO INTRA EXTRACRAN SEL COM CAROTID INNOMINATE BILAT MOD SED   09/18/2019  . IR ANGIO VERTEBRAL SEL SUBCLAVIAN INNOMINATE UNI L MOD SED   09/18/2019  . IR ANGIO VERTEBRAL SEL VERTEBRAL UNI R MOD SED   09/18/2019  . IR ANGIO VERTEBRAL SEL VERTEBRAL UNI R MOD SED  09/19/2019  . IR ANGIOGRAM FOLLOW UP STUDY   09/19/2019  . IR CT HEAD LTD   09/19/2019  . IR IVC FILTER PLMT / S&I /IMG GUID/MOD SED   09/25/2019  . IR NEURO EACH ADD'L AFTER BASIC UNI RIGHT (MS)   09/19/2019  . IR TRANSCATH/EMBOLIZ   09/19/2019  . IR US GUIDE VASC ACCESS RIGHT   09/18/2019  . IR US GUIDE VASC ACCESS RIGHT   09/19/2019  . RADIOLOGY WITH ANESTHESIA N/A 09/19/2019    Procedure: RADIOLOGY WITH ANESTHESIA ANUERYSM COILING;  Surgeon: Pedro Earls, MD;  Location: [facility];  Service: Radiology;  Laterality: N/A;  .  TONSILLECTOMY             Family History  Problem Relation Age of Onset  . Colon cancer Father        Social History:  Married. Pastor--still works. Independent without AD. He reports that he has never smoked. He has never used smokeless tobacco. He reports that he does not drink alcohol and does not use drugs.         Allergies  Allergen Reactions  . Codeine Shortness Of Breath  . Shellfish Allergy Shortness Of Breath            Medications Prior to Admission  Medication Sig Dispense Refill  . amLODipine (NORVASC) 10 MG tablet Take 1 tablet (10 mg total) by mouth daily. 30 tablet 6  . cabergoline (DOSTINEX) 0.5 MG tablet Take 0.5 mg by mouth 2 (two) times a week. Saturday and wednesday   0  . cloNIDine (CATAPRES - DOSED IN MG/24 HR) 0.3 mg/24hr patch Place 0.3 mg onto the skin every 7 (seven) days. Saturday      . hydrALAZINE (APRESOLINE) 100 MG tablet Take 1 tablet (100 mg total) by mouth every 8 (eight) hours. (Patient taking differently: Take 100 mg by mouth 3 (three) times daily. ) 90 tablet 6  . levothyroxine (SYNTHROID, LEVOTHROID) 100 MCG tablet Take 100 mcg by mouth daily.      Marland Kitchen losartan (COZAAR) 100 MG tablet Take 100 mg by mouth daily.      . cloNIDine (CATAPRES) 0.1 MG tablet One tablet three times daily (Patient not taking: Reported on 04/13/2018) 90 tablet 6  . lisinopril (PRINIVIL,ZESTRIL) 20 MG tablet Take 1 tablet (20 mg total) by mouth 2 (two) times daily. (Patient not taking: Reported on 04/13/2018) 60 tablet 6      Drug Regimen Review  Drug regimen was reviewed and remains appropriate with no significant issues identified   Home: Home Living Family/patient expects to be discharged to:: Private residence Living Arrangements: Spouse/significant other Available Help at Discharge: Family Type of Home: House Home Access: Stairs to enter Technical brewer of Steps: 4-5 Entrance Stairs-Rails: Right, Left, Can reach both Home Layout: One level Bathroom  Shower/Tub: Multimedia programmer: Handicapped height Bathroom Accessibility: Yes Home Equipment: Environmental consultant - 2 wheels, Bedside commode, Wheelchair - manual, Shower seat  Lives With: Spouse, Son   Functional History: Prior Function Level of Independence: Independent Comments: works as a Clinical biochemist Status:  Mobility: [place] bed mobility: Needs Assistance Bed Mobility: Supine to Sit Supine to sit: HOB elevated, Min assist Sit to supine: Max assist, +2 for physical assistance (for LE management and trunk control descending) General bed mobility comments: +rail, increased time, cues for sequencing, assist to elevate trunk Transfers Overall transfer level: Needs assistance Equipment used: Rolling walker (2 wheeled) Transfers: Sit to/from Stand Sit to  Stand: Min assist, From elevated surface General transfer comment: cues for hand placement, assist to power up, increased time to stabilize balance Ambulation/Gait Ambulation/Gait assistance: Min assist Gait Distance (Feet): 5 Feet Assistive device: Rolling walker (2 wheeled) Gait Pattern/deviations: Step-to pattern, Wide base of support General Gait Details: guarded gait, assist to maintain balance Gait velocity: decreased Gait velocity interpretation: <1.8 ft/sec, indicate of risk for recurrent falls   ADL: ADL Overall ADL's : Needs assistance/impaired Eating/Feeding: Minimal assistance Eating/Feeding Details (indicate cue type and reason): poor depth perception; unable to locate utensils on tray without VC; able to complete hand to mouth pattern however pt's wife has been feeding him. Educated wife on need for pt to self feed as much as possible Grooming: Minimal assistance, Sitting Upper Body Bathing: Minimal assistance, Sitting Lower Body Bathing: Total assistance Toilet Transfer: +2 for physical assistance, Moderate assistance, Stand-pivot, RW Toilet Transfer Details (indicate cue type and reason):  simulated OOB to chair Toileting - Clothing Manipulation Details (indicate cue type and reason): total Functional mobility during ADLs: +2 for physical assistance, Moderate assistance, Rolling walker General ADL Comments: pt with decrease balance with RW but reports "its fine"   Cognition: Cognition Overall Cognitive Status: Impaired/Different from baseline Orientation Level: Oriented X4 Cognition Arousal/Alertness: Awake/alert Behavior During Therapy: Flat affect Overall Cognitive Status: Impaired/Different from baseline Area of Impairment: Attention, Memory, Following commands, Safety/judgement, Awareness, Problem solving Orientation Level: Time (June 12th) Current Attention Level: Sustained Memory: Decreased recall of precautions, Decreased short-term memory Following Commands: Follows one step commands consistently Safety/Judgement: Decreased awareness of safety, Decreased awareness of deficits Awareness: Intellectual Problem Solving: Slow processing, Requires verbal cues General Comments: pt delayed response requiring freq verbal cues to stay on task and complete task     Blood pressure (!) 154/60, pulse (!) 46, temperature 98.1 F (36.7 C), temperature source Oral, resp. rate 14, height 5\' 11"  (1.803 m), weight (!) 189.8 kg, SpO2 98 %. Physical Exam  Nursing note and vitals reviewed. Constitutional: He is oriented to person, place, and time. He appears lethargic. He is cooperative.  HENT:  Head: Normocephalic.  Eyes: Pupils are equal, round, and reactive to light.  Cardiovascular: Normal rate and regular rhythm.  Respiratory: Effort normal and breath sounds normal. No respiratory distress. He has no wheezes. He has no rhonchi.  Musculoskeletal:        General: Swelling (1+ edema bilateral hand and pedally) present.     Cervical back: Normal range of motion.  Neurological: He is oriented to person, place, and time. He appears lethargic. No cranial nerve deficit or sensory  deficit.  UE: 3-/5 prox to 4-/5 distally. LE: 3- HF, KE and 3+ ADF/PF. No focal sensory deficits. Reasonable insight and awareness. No dysmetria, good depth perception  Skin:  Bruise on bridge of nose (due to CPAP)  Psychiatric:  Pt is flat but very cooperative, fatigues.       Lab Results Last 48 Hours  No results found for this or any previous visit (from the past 48 hour(s)).   Imaging Results (Last 48 hours)  No results found.           Medical Problem List and Plan: 1.  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  Left posterior tib DVT/Antithrombotics: -DVT/anticoagulation:  Mechanical: Sequential compression devices, below knee Right lower extremity and compression stockings.  -pt had IVCF placed on 6/9.              -antiplatelet therapy: ASA 3. Pain Management: Tylenol prn 4. Mood: LCSW to follow for evaluation and support.              -antipsychotic agents: N/A 5. Neuropsych: This patient is capable of making decisions on his own behalf. 6. Skin/Wound Care: Routine pressure relief measures.  7. Fluids/Electrolytes/Nutrition: Monitor I/O. Check lytes in am. 8. HTN: Monitor BP tid--continue catapres TTS-3 patch, Norvasc, Hydralazine, HCTZ, Avapro and aldactone.             -monitor BP with activity 9. 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 10. OSA: Continue CPAP whenever napping and at nights. Asked patient to have wife bring his home facemask  11.  Hypoxia: Likely multifactorial- body habitus, intermittent napping, OSA. Encourage IS and wean oxygen to off.  12. Morbid Obesity: BMI-58. Heart Healthy diet. Will have dietician educate on appropriate diet.  Encourage weight loss to help promote  health and mobility.  13. Bradycardia: Intermittent and asymptomatic.  Will monitor for now.  14. Hyponatremia: Will recheck labs tomorrow as Na on downward trend.              -most recently 131 15. Leucocytosis/ABLA: 13.5-->11.5             - Recheck CBC in am.  16. Impaired Fasting Glucose: Hgb A1c- 4.7.   17. Dyslipidemia: On Lipitor.            Bary Leriche, PA-C 09/27/2019  I have personally performed a face to face diagnostic evaluation of this patient and formulated the key components of the plan.  Additionally, I have personally reviewed laboratory data, imaging studies, as well as relevant notes and concur with the physician assistant's documentation above.  The patient's status has not changed from the original H&P.  Any changes in documentation from the acute care chart have been noted above.  Meredith Staggers, MD, [facility]

## 2019-09-27 NOTE — Progress Notes (Signed)
Physical Therapy Treatment Patient Details Name: Andrew Perez MRN: [id number] DOB: 1966/04/10 Today's Date: 09/27/2019    History of Present Illness 54 yo male admitted with IVH of 4th ventricle  PMH HTN  morbid obese previous IVH 2018. 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     PT Comments    Pt received in bed, agreeable to participation in therapy. He required min assist bed mobility, min assist transfers, and min assist ambulation 5' with RW. Cues needed for sequencing/safety. Session limited by arrival of lunch tray. Pt in recliner eating lunch at end of session.   Follow Up Recommendations  CIR     Equipment Recommendations  Other (comment) (bariatric RW)    Recommendations for Other Services       Precautions / Restrictions Precautions Precautions: Fall;Other (comment) Precaution Comments: L sided weekness, obese, bilat LE edema-wears compression stocking    Mobility  Bed Mobility Overal bed mobility: Needs Assistance Bed Mobility: Supine to Sit     Supine to sit: HOB elevated;Min assist     General bed mobility comments: +rail, increased time, cues for sequencing, assist to elevate trunk  Transfers Overall transfer level: Needs assistance Equipment used: Rolling walker (2 wheeled) Transfers: Sit to/from Stand Sit to Stand: Min assist;From elevated surface         General transfer comment: cues for hand placement, assist to power up, increased time to stabilize balance  Ambulation/Gait Ambulation/Gait assistance: Min assist Gait Distance (Feet): 5 Feet Assistive device: Rolling walker (2 wheeled) Gait Pattern/deviations: Step-to pattern;Wide base of support Gait velocity: decreased Gait velocity interpretation: <1.8 ft/sec, indicate of risk for recurrent falls General Gait Details: guarded gait, assist to maintain balance   Stairs             Wheelchair  Mobility    Modified Rankin (Stroke Patients Only)       Balance Overall balance assessment: Needs assistance Sitting-balance support: Feet supported;No upper extremity supported Sitting balance-Leahy Scale: Fair     Standing balance support: Bilateral upper extremity supported;During functional activity Standing balance-Leahy Scale: Poor Standing balance comment: reliant on UE support                            Cognition Arousal/Alertness: Awake/alert Behavior During Therapy: Flat affect Overall Cognitive Status: Impaired/Different from baseline Area of Impairment: Attention;Memory;Following commands;Safety/judgement;Awareness;Problem solving                   Current Attention Level: Sustained Memory: Decreased recall of precautions;Decreased short-term memory Following Commands: Follows one step commands consistently Safety/Judgement: Decreased awareness of safety;Decreased awareness of deficits Awareness: Intellectual Problem Solving: Slow processing;Requires verbal cues        Exercises      General Comments General comments (skin integrity, edema, etc.): VSS on RA      Pertinent Vitals/Pain Pain Assessment: No/denies pain    Home Living                      Prior Function            PT Goals (current goals can now be found in the care plan section) Acute Rehab PT Goals Patient Stated Goal: independence Progress towards PT goals: Progressing toward goals    Frequency    Min 4X/week      PT Plan Current plan remains appropriate  Co-evaluation              AM-PAC PT "6 Clicks" Mobility   Outcome Measure  Help needed turning from your back to your side while in a flat bed without using bedrails?: A Little Help needed moving from lying on your back to sitting on the side of a flat bed without using bedrails?: A Little Help needed moving to and from a bed to a chair (including a wheelchair)?: A Little Help  needed standing up from a chair using your arms (e.g., wheelchair or bedside chair)?: A Little Help needed to walk in hospital room?: A Lot Help needed climbing 3-5 steps with a railing? : Total 6 Click Score: 15    End of Session   Activity Tolerance: Patient tolerated treatment well Patient left: in chair;with call bell/phone within reach;with chair alarm set Nurse Communication: Mobility status PT Visit Diagnosis: Unsteadiness on feet (R26.81);Other abnormalities of gait and mobility (R26.89);Other symptoms and signs involving the nervous system (R29.898)     Time: 4604-7998 PT Time Calculation (min) (ACUTE ONLY): 12 min  Charges:  $Gait Training: 8-22 mins                     Lorrin Goodell, PT  Office # [phone] Pager [phone]    Lorriane Shire 09/27/2019, 12:23 PM

## 2019-09-27 NOTE — Progress Notes (Signed)
Patient arrived to unit A/O x4 via bed. Fall prevention safety plan, plan of care and visitor's policy has been reviewed with patient. Pt was oriented to unit/room. Pt denies any pains. Will continue to monitor. Amanda Cockayne, LPN

## 2019-09-27 NOTE — Progress Notes (Signed)
npatient Rehabilitation Medication Review by a Pharmacist  A complete drug regimen review was completed for this patient to identify any potential clinically significant medication issues.  Clinically significant medication issues were identified:  None   Thank you Anette Guarneri, PharmD

## 2019-09-27 NOTE — Progress Notes (Signed)
Report given to [facility], patient transferred to [phone] with all belongings.  Wife aware of transfer to CIR.

## 2019-09-27 NOTE — Progress Notes (Signed)
Raechel Ache, OT  Rehab Admission Coordinator  Physical Medicine and Rehabilitation  PMR Pre-admission      Signed  Date of Service:  09/26/2019  1:25 PM      Related encounter: ED to Hosp-Admission (Discharged) from 09/17/2019 in [facility]UR[facility]AU[facility]/SURGICAL ICU      Signed       Show:Clear all [x] Manual[x] Template[x] Copied  Added by: [x] Raechel Ache, OT  [] Hover for details PMR Admission Coordinator Pre-Admission Assessment   Patient: Andrew Perez is an 55 y.o., male MRN: [id number] DOB: January 27, 1966 Height: 5' 11"  (180.3 cm) Weight: (!) 189.8 kg                                                                                                                                                  Insurance Information HMO:     PPO: yes     PCP:      IPA:      80/20:      OTHER:  PRIMARY: [employer]      Policy#: [id number]      Subscriber: patient CM Name: Butch Penny       Phone#: [phone] [id number]     Fax#: [phone] Pre-Cert#: [id number]      Employer:  [employer]auf[employer] provided by Butch Penny for admit to CIR with admit date 09/27/19. The case is certified through 0/93/23. Clinical update is due by 10/04/19 to Oscar La (p): [phone] (f): [phone] Benefits:  Phone #: [phone]     Name:  Eff. Date: 04/19/2007-04/17/2020     Deduct: $1,500 ($0 met)      Out of Pocket Max: $6,000 ($283.16 met)      Life Max: NA  CIR: 70% coverage, 30% co-insurance      SNF: 70% coverage, 30% co-insurance; limited to 60 days per cal yr.  Outpatient: 70% coverage, 30% co-insurance; limited to 30 visits/cal yr combined PT/OT/ST   Home Health: 70% coverage, 30% co-insurance; limited to 60 visits per cal yr.      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 male with history of HTN, morbid obesity--BMI 73, IVH-'18, OSA, prolactinoma who was admitted on 09/17/19  with acute onset of HA with dizziness and episode of vomiting. 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 Bleed felt to be hypertensive in nature. He was started on cleviprex for BP management and Dr. Ronnald Ramp consulted for input. CT angio recommended for work up with SBP<140 and NS signed off as no hydrocephalus noted.  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 Katherina Right on 06/03. Post procedure with nausea and Repeat CT head showed right more than left cerebellar infarct and MRI confirmed evolving R>L PIC territory infarct --similar in extension c/w prior CT.   2D echo showed EF 55-60% and moderate LVH and moderately dilated left atrium. Now on ASA for secondary stroke prevention. BP remains labile requiring Cleviprex. CIR recommended due to balance deficits affecting functional status. Pt is to admit to CIR on 09/27/19   Complete NIHSS TOTAL: 2 Glasgow Coma Scale Score: 15   Past Medical History      Past Medical History:  Diagnosis Date  . Hypertension    . IVH (intraventricular hemorrhage) ([place]) 2018    due to HTN  . Prolactinoma [facility])      [facility] by [facility]  . Sleep apnea        Family History  family history includes Colon cancer in his father.   Prior Rehab/Hospitalizations:    Has the patient had prior rehab or hospitalizations prior to admission? No   Has the patient had major surgery during 100 days prior to admission? Yes   Current Medications    Current Facility-Administered Medications:  .  acetaminophen (TYLENOL) tablet 650 mg, 650 mg, Oral, Q4H PRN, 650 mg at 09/21/19 1608 **OR** acetaminophen (TYLENOL) 160 MG/5ML solution 650 mg, 650 mg, Per Tube, Q4H PRN **OR** acetaminophen (TYLENOL) suppository 650 mg, 650 mg, Rectal, Q4H PRN, Amie Portland, MD .  amLODipine (NORVASC) tablet 10 mg, 10 mg, Oral, Daily, Rosalin Hawking, MD, 10 mg at 09/27/19 0914 .  aspirin tablet 325 mg, 325 mg, Oral, Daily, Louk, Alexandra M, PA-C, 325 mg at 09/27/19 0914 .  atorvastatin (LIPITOR) tablet 40 mg, 40 mg, Oral, Daily, Rosalin Hawking, MD, 40 mg at 09/27/19 0914 .  cabergoline (DOSTINEX) tablet 0.5 mg, 0.5 mg, Oral, Once per day on Wed Sat, Xu, Jindong, MD, 0.5 mg at 09/25/19 1012 .  Chlorhexidine Gluconate Cloth 2 % PADS 6 each, 6 each, Topical, Daily, Amie Portland, MD, 6 each at 09/26/19 1007 .  cloNIDine (CATAPRES - Dosed in mg/24 hr) patch 0.3 mg, 0.3 mg, Transdermal, Q7 days, Biby, Sharon L, NP, 0.3 mg at 09/24/19 1117 .  hydrALAZINE (APRESOLINE) injection 20 mg, 20 mg, Intravenous, Q6H PRN, Garvin Fila, MD, 20 mg at 09/25/19 1351 .  hydrALAZINE (APRESOLINE) tablet 100 mg, 100 mg, Oral, TID, Rosalin Hawking, MD, 100 mg at 09/27/19 0914 .  hydrochlorothiazide (HYDRODIURIL) tablet 25 mg, 25 mg, Oral, Daily, Rosalin Hawking, MD, 25 mg at 09/27/19 0914 .  iohexol (OMNIPAQUE) 240 MG/ML injection 150 mL, 150 mL, Intra-arterial, Once PRN, de Sindy Messing, Erven Colla, MD .  iohexol (OMNIPAQUE) 240 MG/ML injection 150 mL, 150 mL, Intra-arterial, Once PRN, de Sindy Messing, Erven Colla, MD .  irbesartan (AVAPRO) tablet 150 mg, 150 mg, Oral, BID, Leonie Man, Pramod S, MD, 150 mg at 09/27/19 0914 .  labetalol (NORMODYNE) injection 10 mg, 10 mg, Intravenous, Q2H PRN,  Amie Portland, MD, 10 mg at  09/22/19 2354 .  levothyroxine (SYNTHROID) tablet 100 mcg, 100 mcg, Oral, Q0600, Amie Portland, MD, 100 mcg at 09/27/19 0708 .  ondansetron (ZOFRAN) injection 4 mg, 4 mg, Intravenous, Q6H PRN, Amie Portland, MD, 4 mg at 09/20/19 1151 .  pantoprazole (PROTONIX) EC tablet 40 mg, 40 mg, Oral, Daily, Rosalin Hawking, MD, 40 mg at 09/27/19 0914 .  polyethylene glycol (MIRALAX / GLYCOLAX) packet 17 g, 17 g, Oral, Daily, Garvin Fila, MD, 17 g at 09/25/19 1638 .  senna-docusate (Senokot-S) tablet 1 tablet, 1 tablet, Oral, BID, Amie Portland, MD, 1 tablet at 09/25/19 2203 .  spironolactone (ALDACTONE) tablet 50 mg, 50 mg, Oral, Daily, Garvin Fila, MD, 50 mg at 09/27/19 6301   Patients Current Diet:     Diet Order                      Diet regular Room service appropriate? Yes with Assist; Fluid consistency: Thin  Diet effective now                      Precautions / Restrictions Precautions Precautions: Fall, Other (comment) Precaution Comments: L sided weekness, obese, bilat LE edema-wears compression stocking Restrictions Weight Bearing Restrictions: No    Has the patient had 2 or more falls or a fall with injury in the past year?No   Prior Activity Level Community (5-7x/wk): drives, works 6 days/week   Prior Functional Level Prior Function Level of Independence: Independent Comments: works as a Geneticist, molecular Care: Did the patient need help bathing, dressing, using the toilet or eating?  Independent   Indoor Mobility: Did the patient need assistance with walking from room to room (with or without device)? Independent   Stairs: Did the patient need assistance with internal or external stairs (with or without device)? Independent   Functional Cognition: Did the patient need help planning regular tasks such as shopping or remembering to take medications? Independent   Home Assistive Devices / Equipment Home Equipment: Environmental consultant - 2 wheels, Bedside commode, Wheelchair - manual,  Shower seat   Prior Device Use: Indicate devices/aids used by the patient prior to current illness, exacerbation or injury? None of the above   Current Functional Level Cognition   Overall Cognitive Status: Impaired/Different from baseline Current Attention Level: Sustained Orientation Level: Oriented X4 Following Commands: Follows one step commands consistently Safety/Judgement: Decreased awareness of safety, Decreased awareness of deficits General Comments: pt delayed response requiring freq verbal cues to stay on task and complete task    Extremity Assessment (includes Sensation/Coordination)   Upper Extremity Assessment: RUE deficits/detail  Lower Extremity Assessment: Defer to PT evaluation, RLE deficits/detail, LLE deficits/detail RLE Deficits / Details: edema noted in feet LLE Deficits / Details: edema noted     ADLs   Overall ADL's : Needs assistance/impaired Eating/Feeding: Minimal assistance Eating/Feeding Details (indicate cue type and reason): poor depth perception; unable to locate utensils on tray without VC; able to complete hand to mouth pattern however pt's wife has been feeding him. Educated wife on need for pt to self feed as much as possible Grooming: Minimal assistance, Sitting Upper Body Bathing: Minimal assistance, Sitting Lower Body Bathing: Total assistance Toilet Transfer: +2 for physical assistance, Moderate assistance, Stand-pivot, RW Toilet Transfer Details (indicate cue type and reason): simulated OOB to chair Toileting - Clothing Manipulation Details (indicate cue type and reason): total Functional mobility during ADLs: +2 for physical assistance, Moderate  assistance, Rolling walker General ADL Comments: pt with decrease balance with RW but reports "its fine"     Mobility   Overal bed mobility: Needs Assistance Bed Mobility: Supine to Sit Supine to sit: Supervision, HOB elevated Sit to supine: Max assist, +2 for physical assistance (for LE  management and trunk control descending) General bed mobility comments: HOB elevated, increased time, minA for trunk elevation and to scoot hips to EOB     Transfers   Overall transfer level: Needs assistance Equipment used: Rolling walker (2 wheeled) (bariatric) Transfers: Sit to/from Stand Sit to Stand: Min assist, From elevated surface General transfer comment: PT provides cues for hand placement     Ambulation / Gait / Stairs / Wheelchair Mobility   Ambulation/Gait Ambulation/Gait assistance: Herbalist (Feet): 6 Feet Assistive device: Rolling walker (2 wheeled) (bariatric RW) Gait Pattern/deviations: Step-to pattern, Wide base of support General Gait Details: pt with short step to gait with widened BOS, ambulation distance cut short by explosive bowel movement Gait velocity: reduced Gait velocity interpretation: <1.8 ft/sec, indicate of risk for recurrent falls     Posture / Balance Dynamic Sitting Balance Sitting balance - Comments: close supervision at edge of bed for static sitting Balance Overall balance assessment: Needs assistance Sitting-balance support: Single extremity supported, Feet supported Sitting balance-Leahy Scale: Fair Sitting balance - Comments: close supervision at edge of bed for static sitting Standing balance support: Bilateral upper extremity supported Standing balance-Leahy Scale: Fair Standing balance comment: minG with BUE support of RW     Special needs/care consideration CPAP, Continuous Drip IV: no, Oxygen: yes, Skin: skin tear to medial nose; surgical incision to right IJ,  and Designated visitor: Seth Bake (wife)        Previous Environmental health practitioner (from acute therapy documentation) Living Arrangements: Spouse/significant other  Lives With: Spouse, Son Available Help at Discharge: Family Type of Home: House Home Layout: One level Home Access: Stairs to enter Entrance Stairs-Rails: Right, Left, Can reach both Entrance  Stairs-Number of Steps: 4-5 Bathroom Shower/Tub: Multimedia programmer: Handicapped height Bathroom Accessibility: Yes How Accessible: Accessible via walker [place]: No   Discharge Living Setting Plans for Discharge Living Setting: Patient's home Type of Home at Discharge: House Discharge Home Layout: One level Discharge Home Access: Stairs to enter Entrance Stairs-Rails: Right, Left, Can reach both Entrance Stairs-Number of Steps: 4-5 Discharge Bathroom Shower/Tub: Walk-in shower Discharge Bathroom Toilet: Handicapped height Discharge Bathroom Accessibility: Yes How Accessible: Accessible via walker Does the patient have any problems obtaining your medications?: No   Social/Family/Support Systems Patient Roles: Spouse Anticipated Caregiver: Larry Sierras, wife Anticipated Caregiver's Contact Information: [phone] Ability/Limitations of Caregiver: Min A Caregiver Availability: 24/7 Discharge Plan Discussed with Primary Caregiver: Yes Is Caregiver In Agreement with Plan?: Yes Does Caregiver/Family have Issues with Lodging/Transportation while Pt is in Rehab?: No     Goals Patient/Family Goal for Rehab: Mod I-supervision: PT/OT, Mod I: ST Expected length of stay: 13-16 days Pt/Family Agrees to Admission and willing to participate: Yes Program Orientation Provided & Reviewed with Pt/Caregiver Including Roles  & Responsibilities: Yes  Barriers to Discharge: Home environment access/layout, New oxygen  Barriers to Discharge Comments: steps to enter, new O2 demands     Decrease burden of Care through IP rehab admission: NA     Possible need for SNF placement upon discharge:Not anticipated. Pt has great family support from his wife and he has an excellent prognosis for further progress through CIR.      Patient  Condition: This patient's medical and functional status has changed since the consult dated: 09/23/19 in which the Rehabilitation Physician  determined and documented that the patient's condition is appropriate for intensive rehabilitative care in an inpatient rehabilitation facility. See "History of Present Illness" (above) for medical update. Functional changes are: improvement in transfers from Min A +2 to Min A, improvement in gait from Min A +2 for 12 feet to Min A for 6 feet. Patient's medical and functional status update has been discussed with the Rehabilitation physician and patient remains appropriate for inpatient rehabilitation. Will admit to inpatient rehab today.   Preadmission Screen Completed By:  Raechel Ache, OT, 09/27/2019 12:05 PM ______________________________________________________________________   Discussed status with Dr. Naaman Plummer on 6/11/21at 12:05PM and received approval for admission today.   Admission Coordinator:  Raechel Ache, time 12:05PM/Date 09/27/19             Cosigned by: Meredith Staggers, MD at 09/27/2019 12:11 PM  Revision History                Note Details  Author Raechel Ache, OT File Time 09/27/2019 12:05 PM  Author Type Rehab Admission Coordinator Status Signed  Last Editor Raechel Ache, [place] [facility] Physical Medicine and [place] # [phone] Admit Date 09/27/2019

## 2019-09-27 NOTE — Discharge Summary (Addendum)
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 Karenann Cai Active Problems:   Morbid obesity ([place])   Hypertensive emergency   Hyperlipidemia   Hypokalemia   Leukocytosis   Cerebral aneurysm, R PICA s/p coiling   Cerebral infarct  (HCC) B PICA infarcts   Prolactinoma (HCC)   DVT of leg (deep venous thrombosis) (HCC)   Obstructive sleep apnea   Hypothyroidism Right PICA aneurysm    Medications to be continued on Rehab Allergies as of 09/27/2019      Reactions   Codeine Shortness Of Breath   Shellfish Allergy Shortness Of Breath      Medication List    STOP taking these medications   cloNIDine 0.1 MG tablet Commonly known as: CATAPRES   lisinopril 20 MG tablet Commonly known as: ZESTRIL   losartan 100 MG tablet Commonly known as: COZAAR     TAKE these medications   amLODipine 10 MG tablet Commonly known as: NORVASC Take 1 tablet (10 mg total) by mouth daily.   aspirin 325 MG tablet Take 1 tablet (325 mg total) by mouth daily. Start taking on: September 28, 2019   atorvastatin 40 MG tablet Commonly known as: LIPITOR Take 1 tablet (40 mg total) by mouth daily. Start taking on: September 28, 2019   cabergoline 0.5 MG tablet Commonly known as: DOSTINEX Take 0.5  mg by mouth 2 (two) times a week. Saturday and wednesday   Chlorhexidine Gluconate Cloth 2 % Pads Apply 6 each topically daily. Start taking on: September 28, 2019   cloNIDine 0.3 mg/24hr patch Commonly known as: CATAPRES - Dosed in mg/24 hr Place 0.3 mg onto the skin every 7 (seven) days. Saturday   hydrALAZINE 100 MG tablet Commonly known as: APRESOLINE Take 1 tablet (100 mg total) by mouth 3 (three) times daily. What changed: when to take this   hydrALAZINE 20 MG/ML injection Commonly known as: APRESOLINE Inject 1 mL (20 mg total) into the vein every 6 (six) hours as needed (For SBP > 160). What changed: You were already taking a medication with the same name, and this prescription was added. Make sure you understand how and when to take each.   hydrochlorothiazide 25 MG tablet Commonly known as: HYDRODIURIL Take 1 tablet (25 mg total) by mouth daily. Start taking on: September 28, 2019   iohexol 240 MG/ML injection Commonly known as: OMNIPAQUE Inject 150 mLs into the artery once as needed.   iohexol 240 MG/ML injection Commonly known as: OMNIPAQUE Inject 150 mLs into the artery once as needed.   irbesartan 150 MG tablet Commonly known as: AVAPRO Take 1 tablet (150 mg total) by mouth 2 (two) times daily.   labetalol 5 MG/ML injection Commonly known as: NORMODYNE Inject 2 mLs (10 mg total) into the vein every 2 (two) hours as needed (SBP goal <  160).   levothyroxine 100 MCG tablet Commonly known as: SYNTHROID Take 100 mcg by mouth daily.   polyethylene glycol 17 g packet Commonly known as: MIRALAX / GLYCOLAX Take 17 g by mouth daily. Start taking on: September 28, 2019   senna-docusate 8.6-50 MG tablet Commonly known as: Senokot-S Take 1 tablet by mouth 2 (two) times daily.   spironolactone 50 MG tablet Commonly known as: ALDACTONE Take 1 tablet (50 mg total) by mouth daily. Start taking on: September 28, 2019            Discharge Care Instructions  (From admission,  onward)         Start     Ordered   09/27/19 0000  Discharge wound care:       Comments: Monitor wound daily and dress as needed on rehab unit   09/27/19 1434          LABORATORY STUDIES CBC    Component Value Date/Time   WBC 11.5 (H) 09/23/2019 1251   RBC 4.15 (L) 09/23/2019 1251   HGB 12.2 (L) 09/23/2019 1251   HCT 35.6 (L) 09/23/2019 1251   PLT 225 09/23/2019 1251   MCV 85.8 09/23/2019 1251   MCH 29.4 09/23/2019 1251   MCHC 34.3 09/23/2019 1251   RDW 14.8 09/23/2019 1251   CMP    Component Value Date/Time   NA 131 (L) 09/23/2019 1251   K 4.1 09/23/2019 1251   CL 96 (L) 09/23/2019 1251   CO2 25 09/23/2019 1251   GLUCOSE 115 (H) 09/23/2019 1251   BUN 17 09/23/2019 1251   CREATININE 0.80 09/23/2019 1251   CALCIUM 8.8 (L) 09/23/2019 1251   PROT 6.0 (L) 07/03/2016 0430   ALBUMIN 3.3 (L) 07/03/2016 0430   AST 11 (L) 07/03/2016 0430   ALT 16 (L) 07/03/2016 0430   ALKPHOS 59 07/03/2016 0430   BILITOT 1.0 07/03/2016 0430   GFRNONAA >60 09/23/2019 1251   GFRAA >60 09/23/2019 1251   COAGS Lab Results  Component Value Date   INR 1.0 09/19/2019   Lipid Panel    Component Value Date/Time   CHOL 159 09/19/2019 0515   TRIG 100 09/19/2019 0515   HDL 35 (L) 09/19/2019 0515   CHOLHDL 4.5 09/19/2019 0515   VLDL 20 09/19/2019 0515   LDLCALC 104 (H) 09/19/2019 0515   HgbA1C  Lab Results  Component Value Date   HGBA1C 4.7 (L) 09/18/2019   Urine Drug Screen     Component Value Date/Time   LABOPIA POSITIVE (A) 09/17/2019 2126   COCAINSCRNUR NONE DETECTED 09/17/2019 2126   LABBENZ NONE DETECTED 09/17/2019 2126   AMPHETMU NONE DETECTED 09/17/2019 2126   THCU NONE DETECTED 09/17/2019 2126   LABBARB NONE DETECTED 09/17/2019 2126     SIGNIFICANT DIAGNOSTIC STUDIES CT HEAD WO CONTRAST  Result Date: 09/24/2019 CLINICAL DATA:  Stroke.  Follow-up.  Lethargy. EXAM: CT HEAD WITHOUT CONTRAST TECHNIQUE: Contiguous axial images were obtained from the base of the skull  through the vertex without intravenous contrast. COMPARISON:  MR head without contrast 09/20/2019. CT head without contrast 09/20/2019. 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 Liquid embolic agent within the right PICA again noted. Previously noted intraventricular hemorrhage has resolved. Progressive mass effect is noted. The frontal horns of the lateral ventricles are slightly more prominent than on the prior study. Asymmetry of the temporal horns is stable. Third ventricle is slightly more prominent. No new infarcts are present. Persistent generalized mass effect is  present with effacement of the sulci. Vascular: Atherosclerotic changes are present within the cavernous internal carotid arteries without a hyperdense vessel. Liquid embolic material is noted as above. Skull: Calvarium is intact. No focal lytic or blastic lesions are present. No significant extracranial soft tissue lesion is present. Sinuses/Orbits: The paranasal sinuses and mastoid air cells are clear. The globes and orbits are within normal limits. IMPRESSION: 1. Evolving nonhemorrhagic cerebellar infarcts, right greater than left. 2. Liquid embolic material within the right PICA. 3. Progressive mass effect with effacement of the sulci in the posterior fossa and supratentorial structures. 4. Slight increase in size of the frontal horns of the lateral ventricles and third ventricle. Asymmetry of the temporal horns is stable. Question slight hydrocephalus. 5. No new infarcts. Electronically Signed   By: San Morelle M.D.   On: 09/24/2019 11:26   CT HEAD WO CONTRAST  Result Date: 09/20/2019 CLINICAL DATA:  Stroke follow-up EXAM: CT HEAD WITHOUT CONTRAST TECHNIQUE: Contiguous axial images were obtained from the base of the skull through the vertex without intravenous contrast. 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 Onyx is  seen at the level of vessel embolization. Narrowing of the lower fourth ventricle without hydrocephalus. There is small volume intraventricular hemorrhage which is not progressed. Vascular: As above Skull: No acute or aggressive finding. Sinuses/Orbits: Right mastoid and middle ear opacification. 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 No hydrocephalus or interval hemorrhage. Electronically Signed   By: Monte Fantasia M.D.   On: 09/20/2019 07:13   CT HEAD WO CONTRAST  Result Date: 09/18/2019 CLINICAL DATA:  Follow-up subarachnoid hemorrhage EXAM: CT HEAD WITHOUT CONTRAST TECHNIQUE: Contiguous axial images were obtained from the base of the skull through the vertex without intravenous contrast. COMPARISON:  Head CT from yesterday and 06/28/2016 FINDINGS: Brain: Slight interval redistribution of small volume intraventricular hemorrhage seen at the fourth, third, and lateral ventricles. Potentially parenchymal high-density at the roof of the fourth ventricle. Fourth ventricular hemorrhage was seen in 2018 as well. Lytic and low-density skull base mass with well-defined margins and very low-density. 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 No evidence of infarct.  No hydrocephalus. Vascular: Negative by noncontrast CT Skull: Tumoral skull base erosion centered at the sella. Sinuses/Orbits: Negative IMPRESSION: 1. Allowing for redistribution, no convincing increase in the intraventricular hemorrhage. No hydrocephalus. 2. Fourth ventricular hemorrhage was also seen in 2018, recommend updated MRI/MRA to evaluate for underlying lesion. 3. Known sellar mass with skull base erosion. Electronically Signed   By: Monte Fantasia M.D.   On: 09/18/2019 04:36   CT HEAD WO CONTRAST  Result Date: 09/17/2019 CLINICAL DATA:  Headache. 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 EXAM: CT HEAD WITHOUT CONTRAST  TECHNIQUE: Contiguous axial images were obtained from the base of the skull through the vertex without intravenous contrast. COMPARISON:  Brain MR 06/28/2016.  CT 06/28/2016. FINDINGS: Brain: Previously identified infiltrative mass lesion at the central skull base appears to have at least been partially resected since previous. Evaluation for residual tumor limited on this noncontrast CT examination. Small amount of hyperdensity seen within the fourth ventricle, suspicious for acute intraventricular hemorrhage (series 3, image 11). This measures up to 1 cm in maximal diameter. Overall ventricular size and morphology is fairly stable without obstructive hydrocephalus or trapping. No other acute intracranial hemorrhage. No acute large vessel territory infarct. No other mass lesion, mass effect, or  midline shift. No extra-axial fluid collection. Vascular: No hyperdense vessel. Scattered vascular calcifications noted within the carotid siphons. Skull: Scalp soft tissues demonstrate no acute abnormality. Calvarium intact. Sinuses/Orbits: Globes and orbital soft tissues within normal limits. Small left maxillary sinus retention cyst noted. Paranasal sinuses are otherwise largely clear. Chronic right mastoid and middle ear effusion noted. IMPRESSION: 1. Small volume acute intraventricular hemorrhage involving the fourth ventricle. No evidence for obstructive hydrocephalus. 2. Suspected at least partial interval resection of previously identified infiltrative mass lesion at the central skull base. Evaluation for residual tumor limited on this noncontrast CT examination. 3. No other acute intracranial abnormality. 4. Chronic right mastoid and middle ear effusion. Critical Value/emergent results were called by telephone at the time of interpretation on 09/17/2019 at 7:38 pm to provider [facility] , who verbally acknowledged these results. Electronically Signed   By: Jeannine Boga M.D.   On: 09/17/2019 19:39   MR  BRAIN WO CONTRAST  Result Date: 09/20/2019 CLINICAL DATA:  Stroke follow-up. EXAM: MRI HEAD WITHOUT CONTRAST TECHNIQUE: Multiplanar, multiecho pulse sequences of the brain and surrounding structures were obtained without intravenous contrast. COMPARISON:  Head CT performed on September 20, 2019 at 6:48 a.m. MRI of the brain June 28, 2016. FINDINGS: Brain: Area of restricted diffusion is seen involving predominantly the right PICA territory with extension to the medial aspect of the left inferior left cerebellar hemisphere, in a similar extension as compared to recent head CT. No brainstem involvement. No hydrocephalus. Susceptibility artifact within the fourth ventricles and in the dependent portion of the bilateral occipital horns represent known intraventricular hemorrhage. More linear appearing susceptibility artifact in the fourth ventricle/inferior aspect of the cerebellar hemisphere corresponds to liquid embolic cast. Mild increase FLAIR signal within the cerebral sulci may reflect minimal residual subarachnoid hemorrhage from blood redistribution. Unchanged small remote right putaminal infarct and few scattered white matter foci of T2 hyperintensity. Enlarged sella with erosion of the clivus with predominantly T2 hyperintense solid cystic mass lesion and anterior displacement of the right carotid siphon which is encased by the mass, without stenosis. Compared to prior study, the lesion appear to have increased T2 signal with increased cystic component. Vascular: Normal flow voids, noting that the posteroinferior cerebellar artery is too small to be accurately evaluated with this technique. Skull and upper cervical spine: Normal marrow signal. Sinuses/Orbits: Mucous retention cyst in the left maxillary sinus. Mild mucosal thickening of the ethmoid cells. Other: Small amount of fluid in the right mastoid cells. Decreased diameter of the nasopharynx which may be associated with obstructive sleep apnea. IMPRESSION:  Evolving bilateral PICA territory infarct, similar in extension to prior CT. There is no hydrocephalus. These results were called by telephone at the time of interpretation on 09/20/2019 at 1:38 pm to provider [facility] , who verbally acknowledged these results. Electronically Signed   By: Pedro Earls M.D.   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 He presented with acute onset of posterior headache, dizziness and vomiting on 09/17/2019. 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 EXAM: DIAGNOSTIC CEREBRAL ANGIOGRAM AND ENDOVASCULAR EMBOLIZATION COMPARISON:  Diagnostic cerebral angiogram 09/18/2019 MEDICATIONS: No antibiotic administered. ANESTHESIA/SEDATION: The procedure was performed under general anesthesia. FLUOROSCOPY TIME:  Fluoroscopy  Time: 74 minutes 42 seconds (5436 mGy). COMPLICATIONS: SIR LEVEL B - Normal therapy, includes overnight admission for observation. TECHNIQUE: Informed written consent was obtained from the patient after a thorough discussion of the procedural risks, benefits and alternatives. All questions were addressed. Maximal Sterile Barrier Technique was utilized including caps, mask, sterile gowns, sterile gloves, sterile drape, hand hygiene and skin antiseptic. A timeout was performed prior to the initiation of the procedure. Real-time ultrasound guidance was utilized for vascular access including the acquisition of a permanent ultrasound image documenting patency of the accessed vessel. Using the modified Seldinger technique and a micropuncture kit, access was gained to the right radial artery at  the wrist and a 6 French sheath was placed. Slow intra arterial infusion of 5,000 IU heparin, 5 mg Verapamil and 200 mcg nitroglycerin diluted in patient's own blood was performed. No significant fluctuation in patient's blood pressure seen. Then, a right radial artery angiogram was obtained via sheath side port. Normal brachial artery branching pattern seen. No significant anatomical variation. The right radial artery caliber is adequate for vascular access. Then, a 5 [place] Simmons 2 glide catheter was navigated over a 0.035 inch Terumo Glidewire into the right subclavian artery. A right subclavian artery roadmap was obtained. The catheter was then advanced into the V2 segment of the right vertebral artery. Townes and lateral views of the head were obtained centered on the posterior fossa. 3D rotational angiograms were acquired and post processed in a separate workstation under concurrent attending physician supervision. Selected images were sent to PACS. FINDINGS: Again seen is a 5 mm dissecting aneurysm as seen on prior angiogram. However, it appears that this aneurysm originates from a dissected branch off of the telovelotonsillar segment of the right posterior inferior cerebellar artery (PICA). The right vertebral artery, basilar artery, and bilateral posterior cerebral arteries are unremarkable. No abnormally high-flow, early draining veins are seen. No regions of abnormal hypervascularity are noted. The visualized dural sinuses are patent. PROCEDURE: Under biplane roadmap, the Simmons 2 catheter was removed over the wire. Intra arterial infusion of 5000 units of heparin, 200 mcg of nitroglycerin and 5 mg of verapamil was performed to the right radial artery via sheath side port. Then, a benchmark guide catheter was advanced over the wire and under fluoroscopic guidance into the distal V2 segment of the right vertebral artery. Magnified frontal and lateral views of the head were obtained in the working  projections. Using biplane roadmap, a headway duo microcatheter was coaxially advanced over a synchro 2 micro guidewire into the right vertebral artery and then navigated into the right posteroinferior cerebellar artery. Frontal and lateral angiograms were obtained with microcatheter contrast injection. The microcatheter was advanced near the origin of the target vessel. Attempted placement of a 2 mm x 4 cm Target 360 Nano coil proved unsuccessful. Follow-up right vertebral artery angiograms with magnified frontal and lateral views of the head were projections obtained. The microcatheter was then advanced and wedged at the origin of the right targeted PICA branch. The microcatheter was then prepped with DMSO. Following, slow injection of onyx 18 was performed under fluoroscopy. Embolization of the target vessel achieved. Small reflux into the PICA noted. The catheter was removed under constant aspiration. Follow-up angiogram showed occlusion of PICA at the telovelotonsillar segment. Right vertebral artery angiograms with frontal and lateral views of the entire head to evidence of distal thromboembolic complication it. Flat panel CT of the head was obtained and post processed in a separate workstation with concurrent attending  physician supervision. Selected images were sent to PACS. No evidence of hemorrhagic complication noted. The catheter was subsequently withdrawn. The right radial sheath was removed and inflatable band was placed over the access site for patent hemostasis. 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 PLAN: 1. Continued observation in ICU. 2. Stat head CT for any neurological deterioration. Patient and spouse communicated in person. Electronically Signed   By: Pedro Earls M.D.   On: 09/20/2019 09:33   IR Angiogram Follow Up Study  Result Date: 09/20/2019 INDICATION: 54 year old male with past  medical history significant for hypertension, 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 He presented with acute onset of posterior headache, dizziness and vomiting on 09/17/2019. 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 EXAM: DIAGNOSTIC CEREBRAL ANGIOGRAM AND ENDOVASCULAR EMBOLIZATION COMPARISON:  Diagnostic cerebral angiogram 09/18/2019 MEDICATIONS: No antibiotic administered. ANESTHESIA/SEDATION: The procedure was performed under general anesthesia. FLUOROSCOPY TIME:  Fluoroscopy Time: 74 minutes 42 seconds (5436 mGy). COMPLICATIONS: SIR LEVEL B - Normal therapy, includes overnight admission for observation. TECHNIQUE: Informed written consent was obtained from the patient after a thorough discussion of the procedural risks, benefits and alternatives. All questions were addressed. Maximal Sterile Barrier Technique was utilized including caps, mask, sterile gowns, sterile gloves, sterile drape, hand hygiene and skin antiseptic. A timeout was performed prior to the initiation of the procedure. Real-time ultrasound guidance was utilized for vascular access including the acquisition of a permanent ultrasound image documenting patency of the accessed vessel. Using the modified Seldinger technique and a micropuncture kit, access was gained to the right radial artery at the wrist and a 6 French sheath was placed. Slow intra arterial infusion of 5,000 IU heparin, 5 mg Verapamil and 200 mcg nitroglycerin diluted in patient's own blood was performed. No significant fluctuation in patient's blood pressure seen. Then, a right radial artery angiogram was obtained via sheath side port. Normal brachial artery branching pattern seen. No significant anatomical variation. The right radial artery caliber is  adequate for vascular access. Then, a 5 [place] Simmons 2 glide catheter was navigated over a 0.035 inch Terumo Glidewire into the right subclavian artery. A right subclavian artery roadmap was obtained. The catheter was then advanced into the V2 segment of the right vertebral artery. Townes and lateral views of the head were obtained centered on the posterior fossa. 3D rotational angiograms were acquired and post processed in a separate workstation under concurrent attending physician supervision. Selected images were sent to PACS. FINDINGS: Again seen is a 5 mm dissecting aneurysm as seen on prior angiogram. However, it appears that this aneurysm originates from a dissected branch off of the telovelotonsillar segment of the right posterior inferior cerebellar artery (PICA). The right vertebral artery, basilar artery, and bilateral posterior cerebral arteries are unremarkable. No abnormally high-flow, early draining veins are seen. No regions of abnormal hypervascularity are noted. The visualized dural sinuses are patent. PROCEDURE: Under biplane roadmap, the Simmons 2 catheter was removed over the wire. Intra arterial infusion of 5000 units of heparin, 200 mcg of nitroglycerin and 5 mg of verapamil was performed to the right radial artery via sheath side port. Then, a benchmark guide catheter was advanced over the wire and under fluoroscopic guidance into the distal V2 segment of the right vertebral artery. Magnified frontal and  lateral views of the head were obtained in the working projections. Using biplane roadmap, a headway duo microcatheter was coaxially advanced over a synchro 2 micro guidewire into the right vertebral artery and then navigated into the right posteroinferior cerebellar artery. Frontal and lateral angiograms were obtained with microcatheter contrast injection. The microcatheter was advanced near the origin of the target vessel. Attempted placement of a 2 mm x 4 cm Target 360 Nano coil proved  unsuccessful. Follow-up right vertebral artery angiograms with magnified frontal and lateral views of the head were projections obtained. The microcatheter was then advanced and wedged at the origin of the right targeted PICA branch. The microcatheter was then prepped with DMSO. Following, slow injection of onyx 18 was performed under fluoroscopy. Embolization of the target vessel achieved. Small reflux into the PICA noted. The catheter was removed under constant aspiration. Follow-up angiogram showed occlusion of PICA at the telovelotonsillar segment. Right vertebral artery angiograms with frontal and lateral views of the entire head to evidence of distal thromboembolic complication it. Flat panel CT of the head was obtained and post processed in a separate workstation with concurrent attending physician supervision. Selected images were sent to PACS. No evidence of hemorrhagic complication noted. The catheter was subsequently withdrawn. The right radial sheath was removed and inflatable band was placed over the access site for patent hemostasis. 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 PLAN: 1. Continued observation in ICU. 2. Stat head CT for any neurological deterioration. Patient and spouse communicated in person. Electronically Signed   By: Pedro Earls M.D.   On: 09/20/2019 09:33   IR IVC FILTER PLMT / S&I Burke Keels GUID/MOD SED  Result Date: 09/25/2019 CLINICAL DATA:  Lower extremity DVT. Recurrent acute intracranial hemorrhage, a relative contraindication to anticoagulation. Caval filtration requested. EXAM: INFERIOR VENACAVOGRAM IVC FILTER PLACEMENT UNDER FLUOROSCOPY FLUOROSCOPY TIME:  96 seconds; 258 mGy TECHNIQUE: The procedure, risks (including but not limited to bleeding, infection, organ damage ), benefits, and alternatives were explained to the patient. Questions regarding the procedure were encouraged  and answered. The patient understands and consents to the procedure. Patency of the right IJ vein was confirmed with ultrasound with image documentation. An appropriate skin site was determined. Skin site was marked, prepped with chlorhexidine, and draped using maximum barrier technique. The region was infiltrated locally with 1% lidocaine. Intravenous Fentanyl 12.40mg and Versed 0.55mwere administered as conscious sedation during continuous monitoring of the patient's level of consciousness and physiological / cardiorespiratory status by the radiology RN, with a total moderate sedation time of 14 minutes. Under real-time ultrasound guidance, the right IJ vein was accessed with a 21 gauge micropuncture needle; the needle tip within the vein was confirmed with ultrasound image documentation. The needle was exchanged over a 018 guidewire for a transitional dilator, which allow advancement of the [facility] into the IVC. A long 6 French vascular sheath was placed for inferior venacavography. This demonstrated no caval thrombus. Renal vein inflows were evident. The [facility] filter was advanced through the sheath and successfully deployed under fluoroscopy at the L2 level. Followup cavagram demonstrates stable filter position and no evident complication. The sheath was removed and hemostasis achieved at the site. No immediate complication. IMPRESSION: 1. Normal IVC. No thrombus or significant anatomic variation. 2. Technically successful infrarenal IVC filter placement. This is a retrievable model. PLAN: This IVC filter is potentially retrievable. The patient will be assessed for filter retrieval by  Interventional Radiology in approximately 8-12 weeks. Further recommendations regarding filter retrieval, continued surveillance or declaration of device permanence, will be made at that time. Electronically Signed   By: Lucrezia Europe M.D.   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 He presented with acute onset of posterior headache, dizziness and vomiting on 09/17/2019. 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 EXAM: DIAGNOSTIC CEREBRAL ANGIOGRAM AND ENDOVASCULAR EMBOLIZATION COMPARISON:  Diagnostic cerebral angiogram 09/18/2019 MEDICATIONS: No antibiotic administered. ANESTHESIA/SEDATION: The procedure was performed under general anesthesia. FLUOROSCOPY TIME:  Fluoroscopy Time: 74 minutes 42 seconds (5436 mGy). COMPLICATIONS: SIR LEVEL B - Normal therapy, includes overnight admission for observation. TECHNIQUE: Informed written consent was obtained from the patient after a thorough discussion of the procedural risks, benefits and alternatives. All questions were addressed. Maximal Sterile Barrier Technique was utilized including caps, mask, sterile gowns, sterile gloves, sterile drape, hand hygiene and skin antiseptic. A timeout was performed prior to the initiation of the procedure. Real-time ultrasound guidance was utilized for vascular access including the acquisition of a permanent ultrasound image documenting patency of the accessed vessel. Using the modified Seldinger technique and a micropuncture kit, access was gained to the right radial artery at the wrist and a 6 French sheath was placed. Slow intra arterial infusion of 5,000 IU heparin, 5 mg Verapamil and 200 mcg nitroglycerin diluted in patient's own blood was performed. No significant fluctuation in patient's blood pressure seen. Then, a right radial artery angiogram was obtained via sheath side port. Normal brachial artery branching pattern seen. No significant anatomical  variation. The right radial artery caliber is adequate for vascular access. Then, a 5 [place] Simmons 2 glide catheter was navigated over a 0.035 inch Terumo Glidewire into the right subclavian artery. A right subclavian artery roadmap was obtained. The catheter was then advanced into the V2 segment of the right vertebral artery. Townes and lateral views of the head were obtained centered on the posterior fossa. 3D rotational angiograms were acquired and post processed in a separate workstation under concurrent attending physician supervision. Selected images were sent to PACS. FINDINGS: Again seen is a 5 mm dissecting aneurysm as seen on prior angiogram. However, it appears that this aneurysm originates from a dissected branch off of the telovelotonsillar segment of the right posterior inferior cerebellar artery (PICA). The right vertebral artery, basilar artery, and bilateral posterior cerebral arteries are unremarkable. No abnormally high-flow, early draining veins are seen. No regions of abnormal hypervascularity are noted. The visualized dural sinuses are patent. PROCEDURE: Under biplane roadmap, the Simmons 2 catheter was removed over the wire. Intra arterial infusion of 5000 units of heparin, 200 mcg of nitroglycerin and 5 mg of verapamil was performed to the right radial artery via sheath side port. Then, a benchmark guide catheter was advanced over the wire and under fluoroscopic guidance into the distal V2 segment of the right vertebral artery. Magnified frontal and lateral views of the head were obtained in the working projections. Using biplane roadmap, a headway duo microcatheter was coaxially advanced over a synchro 2 micro guidewire into the right vertebral artery and then navigated into the right posteroinferior cerebellar artery. Frontal and lateral angiograms were obtained with microcatheter contrast injection. The microcatheter  was advanced near the origin of the target vessel. Attempted placement  of a 2 mm x 4 cm Target 360 Nano coil proved unsuccessful. Follow-up right vertebral artery angiograms with magnified frontal and lateral views of the head were projections obtained. The microcatheter was then advanced and wedged at the origin of the right targeted PICA branch. The microcatheter was then prepped with DMSO. Following, slow injection of onyx 18 was performed under fluoroscopy. Embolization of the target vessel achieved. Small reflux into the PICA noted. The catheter was removed under constant aspiration. Follow-up angiogram showed occlusion of PICA at the telovelotonsillar segment. Right vertebral artery angiograms with frontal and lateral views of the entire head to evidence of distal thromboembolic complication it. Flat panel CT of the head was obtained and post processed in a separate workstation with concurrent attending physician supervision. Selected images were sent to PACS. No evidence of hemorrhagic complication noted. The catheter was subsequently withdrawn. The right radial sheath was removed and inflatable band was placed over the access site for patent hemostasis. 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 PLAN: 1. Continued observation in ICU. 2. Stat head CT for any neurological deterioration. Patient and spouse communicated in person. Electronically Signed   By: Pedro Earls M.D.   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 He presented with acute onset of posterior headache, dizziness and vomiting on 09/17/2019. 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 Diagnostic cerebral angiogram performed on 09/18/2019 showed a dissected right PICA  aneurysm, most likely the source of bleed. EXAM: DIAGNOSTIC CEREBRAL ANGIOGRAM AND ENDOVASCULAR EMBOLIZATION COMPARISON:  Diagnostic cerebral angiogram 09/18/2019 MEDICATIONS: No antibiotic administered. ANESTHESIA/SEDATION: The procedure was performed under general anesthesia. FLUOROSCOPY TIME:  Fluoroscopy Time: 74 minutes 42 seconds (5436 mGy). COMPLICATIONS: SIR LEVEL B - Normal therapy, includes overnight admission for observation. TECHNIQUE: Informed written consent was obtained from the patient after a thorough discussion of the procedural risks, benefits and alternatives. All questions were addressed. Maximal Sterile Barrier Technique was utilized including caps, mask, sterile gowns, sterile gloves, sterile drape, hand hygiene and skin antiseptic. A timeout was performed prior to the initiation of the procedure. Real-time ultrasound guidance was utilized for vascular access including the acquisition of a permanent ultrasound image documenting patency of the accessed vessel. Using the modified Seldinger technique and a micropuncture kit, access was gained to the right radial artery at the wrist and a 6 French sheath was placed. Slow intra arterial infusion of 5,000 IU heparin, 5 mg Verapamil and 200 mcg nitroglycerin diluted in patient's own blood was performed. No significant fluctuation in patient's blood pressure seen. Then, a right radial artery angiogram was obtained via sheath side port. Normal brachial artery branching pattern seen. No significant anatomical variation. The right radial artery caliber is adequate for vascular access. Then, a 5 [place] Simmons 2 glide catheter was navigated over a 0.035 inch Terumo Glidewire into the right subclavian artery. A right subclavian artery roadmap was obtained. The catheter was then advanced into the V2 segment of the right vertebral artery. Townes and lateral views of the head were obtained centered on the posterior fossa. 3D rotational angiograms were  acquired and post processed in a separate workstation under concurrent attending physician supervision. Selected images were sent to  PACS. FINDINGS: Again seen is a 5 mm dissecting aneurysm as seen on prior angiogram. However, it appears that this aneurysm originates from a dissected branch off of the telovelotonsillar segment of the right posterior inferior cerebellar artery (PICA). The right vertebral artery, basilar artery, and bilateral posterior cerebral arteries are unremarkable. No abnormally high-flow, early draining veins are seen. No regions of abnormal hypervascularity are noted. The visualized dural sinuses are patent. PROCEDURE: Under biplane roadmap, the Simmons 2 catheter was removed over the wire. Intra arterial infusion of 5000 units of heparin, 200 mcg of nitroglycerin and 5 mg of verapamil was performed to the right radial artery via sheath side port. Then, a benchmark guide catheter was advanced over the wire and under fluoroscopic guidance into the distal V2 segment of the right vertebral artery. Magnified frontal and lateral views of the head were obtained in the working projections. Using biplane roadmap, a headway duo microcatheter was coaxially advanced over a synchro 2 micro guidewire into the right vertebral artery and then navigated into the right posteroinferior cerebellar artery. Frontal and lateral angiograms were obtained with microcatheter contrast injection. The microcatheter was advanced near the origin of the target vessel. Attempted placement of a 2 mm x 4 cm Target 360 Nano coil proved unsuccessful. Follow-up right vertebral artery angiograms with magnified frontal and lateral views of the head were projections obtained. The microcatheter was then advanced and wedged at the origin of the right targeted PICA branch. The microcatheter was then prepped with DMSO. Following, slow injection of onyx 18 was performed under fluoroscopy. Embolization of the target vessel achieved. Small  reflux into the PICA noted. The catheter was removed under constant aspiration. Follow-up angiogram showed occlusion of PICA at the telovelotonsillar segment. Right vertebral artery angiograms with frontal and lateral views of the entire head to evidence of distal thromboembolic complication it. Flat panel CT of the head was obtained and post processed in a separate workstation with concurrent attending physician supervision. Selected images were sent to PACS. No evidence of hemorrhagic complication noted. The catheter was subsequently withdrawn. The right radial sheath was removed and inflatable band was placed over the access site for patent hemostasis. 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 PLAN: 1. Continued observation in ICU. 2. Stat head CT for any neurological deterioration. Patient and spouse communicated in person. Electronically Signed   By: Pedro Earls M.D.   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 He presented with acute onset of posterior headache, dizziness and vomiting on 09/17/2019. 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 EXAM: DIAGNOSTIC CEREBRAL ANGIOGRAM AND ENDOVASCULAR EMBOLIZATION COMPARISON:  Diagnostic cerebral angiogram 09/18/2019 MEDICATIONS: No antibiotic administered. ANESTHESIA/SEDATION: The procedure was performed under general anesthesia. FLUOROSCOPY TIME:  Fluoroscopy Time: 74 minutes 42 seconds (5436 mGy). COMPLICATIONS: SIR LEVEL B - Normal therapy, includes overnight admission for  observation. TECHNIQUE: Informed written consent was obtained from the patient after a thorough discussion of the procedural risks, benefits and alternatives. All questions were addressed. Maximal Sterile Barrier Technique was utilized including caps, mask, sterile gowns, sterile gloves, sterile drape, hand hygiene and skin antiseptic. A timeout was performed prior  to the initiation of the procedure. Real-time ultrasound guidance was utilized for vascular access including the acquisition of a permanent ultrasound image documenting patency of the accessed vessel. Using the modified Seldinger technique and a micropuncture kit, access was gained to the right radial artery at the wrist and a 6 French sheath was placed. Slow intra arterial infusion of 5,000 IU heparin, 5 mg Verapamil and 200 mcg nitroglycerin diluted in patient's own blood was performed. No significant fluctuation in patient's blood pressure seen. Then, a right radial artery angiogram was obtained via sheath side port. Normal brachial artery branching pattern seen. No significant anatomical variation. The right radial artery caliber is adequate for vascular access. Then, a 5 [place] Simmons 2 glide catheter was navigated over a 0.035 inch Terumo Glidewire into the right subclavian artery. A right subclavian artery roadmap was obtained. The catheter was then advanced into the V2 segment of the right vertebral artery. Townes and lateral views of the head were obtained centered on the posterior fossa. 3D rotational angiograms were acquired and post processed in a separate workstation under concurrent attending physician supervision. Selected images were sent to PACS. FINDINGS: Again seen is a 5 mm dissecting aneurysm as seen on prior angiogram. However, it appears that this aneurysm originates from a dissected branch off of the telovelotonsillar segment of the right posterior inferior cerebellar artery (PICA). The right vertebral artery, basilar artery, and  bilateral posterior cerebral arteries are unremarkable. No abnormally high-flow, early draining veins are seen. No regions of abnormal hypervascularity are noted. The visualized dural sinuses are patent. PROCEDURE: Under biplane roadmap, the Simmons 2 catheter was removed over the wire. Intra arterial infusion of 5000 units of heparin, 200 mcg of nitroglycerin and 5 mg of verapamil was performed to the right radial artery via sheath side port. Then, a benchmark guide catheter was advanced over the wire and under fluoroscopic guidance into the distal V2 segment of the right vertebral artery. Magnified frontal and lateral views of the head were obtained in the working projections. Using biplane roadmap, a headway duo microcatheter was coaxially advanced over a synchro 2 micro guidewire into the right vertebral artery and then navigated into the right posteroinferior cerebellar artery. Frontal and lateral angiograms were obtained with microcatheter contrast injection. The microcatheter was advanced near the origin of the target vessel. Attempted placement of a 2 mm x 4 cm Target 360 Nano coil proved unsuccessful. Follow-up right vertebral artery angiograms with magnified frontal and lateral views of the head were projections obtained. The microcatheter was then advanced and wedged at the origin of the right targeted PICA branch. The microcatheter was then prepped with DMSO. Following, slow injection of onyx 18 was performed under fluoroscopy. Embolization of the target vessel achieved. Small reflux into the PICA noted. The catheter was removed under constant aspiration. Follow-up angiogram showed occlusion of PICA at the telovelotonsillar segment. Right vertebral artery angiograms with frontal and lateral views of the entire head to evidence of distal thromboembolic complication it. Flat panel CT of the head was obtained and post processed in a separate workstation with concurrent attending physician supervision.  Selected images were sent to PACS. No evidence of hemorrhagic complication noted. The catheter was subsequently withdrawn. The right radial sheath was removed and inflatable band was placed over the access site for patent hemostasis. 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 PLAN: 1. Continued observation in ICU. 2. Stat head CT for any neurological deterioration.  Patient and spouse communicated in person. Electronically Signed   By: Pedro Earls M.D.   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 He presented with acute onset of posterior headache, dizziness and vomiting on 09/17/2019. 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 Given recurrence of hemorrhage in the same location, further investigation with a diagnostic cerebral angiogram was recommended. EXAM: Diagnostic cerebral angiogram COMPARISON:  CT angiogram of the head July 01, 2016 MEDICATIONS: No antibiotics given. ANESTHESIA/SEDATION: Versed 0.5 mg IV; Fentanyl 50 mcg IV Moderate Sedation Time:  60 The patient was continuously monitored during the procedure by the interventional radiology nurse under my direct supervision. FLUOROSCOPY TIME:  Fluoroscopy Time: 14 minutes 30 seconds (1656 mGy). COMPLICATIONS: None immediate. TECHNIQUE: Informed written consent was obtained from the patient after a thorough discussion of the procedural risks, benefits and alternatives. All questions were addressed. Maximal Sterile Barrier Technique was utilized including caps, mask, sterile gowns, sterile gloves, sterile drape, hand hygiene and skin antiseptic. A timeout was performed prior to the initiation of the  procedure. Using the modified Seldinger technique and a micropuncture kit, access was gained to the right radial artery at the wrist and a 5 French sheath was placed. Slow intra arterial infusion of 5,000 IU heparin, 5 mg Verapamil and 341 mcg nitroglicerin diluted in patient's own blood was performed. No significant fluctuation in patient's blood pressure seen. Then, a right radial artery angiogram was obtained via sheath side port. Normal brachial artery branching pattern seen. No significant anatomical variation. The right radial artery caliber is adequate for vascular access. Then, a 5 [place] Simmons 2 glide catheter was navigated over a 0.035 inch Terumo Glidewire into the right subclavian artery. A right subclavian artery roadmap was obtained. The catheter was then advanced into the V2 segment of the right vertebral artery. Townes, lateral and bilateral magnified oblique views of the head were obtained centered on the posterior fossa. The catheter was then placed into the aortic arch and then advanced into the left common carotid artery. Left anterior oblique view of the neck was obtained followed by frontal, lateral and bilateral oblique views of the head. The catheter was then placed into the left subclavian artery. Frontal and lateral angiograms of the head were obtained followed by frontal the angiogram of the neck. The catheter was subsequently placed in the right common carotid artery. Right anterior oblique view of the neck was obtained. Following, frontal, lateral and bilateral oblique views of the head were obtained. The catheter was subsequently withdrawn. The right radial artery sheath was removed and inflatable band was placed over the access site for patent hemostasis. FINDINGS: Right radial artery ultrasound and right radial artery angiogram: The caliber of the distal right radial artery is appropriate for angiogram access. The right radial artery and the right ulnar artery have normal course and  caliber. No significant anatomical variants noted. Right vertebral artery angiograms: The right vertebral artery, basilar artery, and bilateral posterior cerebral arteries are unremarkable. 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 No abnormally high-flow, early draining veins are seen. No regions of abnormal hypervascularity are noted. The visualized dural sinuses are patent. Left CCA angiograms: Cervical angiograms show normal course and caliber of the visualized left  common carotid and internal carotid arteries. There are no significant stenoses. There is brisk vascular contrast filling of the the ACA and MCA vascular trees. Luminal caliber is smooth and tapering. No aneurysms or abnormally high-flow, early draining veins are seen. No regions of abnormal hypervascularity are noted. The intracranial branches of the left external carotid artery are unremarkable. The visualized dural sinuses are patent. Left subclavian and thyrocervical angiograms: The visualized subclavian artery, proximal left vertebral artery and visualized branches of the thyrocervical trunk are unremarkable. Suboptimal opacification of the intracranial vasculature obtained. The left vertebral artery is codominant and of normal caliber. Right CCA angiograms: Cervical angiograms show normal course and caliber of the visualized right common carotid and internal carotid arteries. There are no significant stenoses. There is brisk vascular contrast filling of the ACA and MCA vascular trees. Luminal caliber is smooth and tapering. No aneurysms or abnormally high-flow, early draining veins are seen. No regions of abnormal hypervascularity are noted. The intracranial branches of the right external carotid artery are unremarkable. The visualized dural sinuses are patent. PROCEDURE: Not applicable. 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 PLAN: Angiogram findings discussed with the patient and his wife as well as with Dr. Erlinda Hong. Planned for angiogram with aneurysm embolization tomorrow. Electronically Signed   By: Pedro Earls M.D.   On: 09/19/2019 16:01   ECHOCARDIOGRAM COMPLETE  Result Date: 09/18/2019    ECHOCARDIOGRAM REPORT   Patient Name:   Andrew Perez Date of Exam: 09/18/2019 Medical Rec #:  [id number]     Height:       71.0 in Accession #:    [id number]    Weight:       418.4 lb Date of Birth:  May 14, 1965     BSA:          2.886 m Patient Age:    8 years      BP:           124/54 mmHg Patient Gender: M             HR:           62 bpm. Exam Location:  Inpatient Procedure: 2D Echo, Color Doppler, Cardiac Doppler and Intracardiac            Opacification Agent Indications:    Stroke i163.9  History:        Patient has prior history of Echocardiogram examinations, most                 recent 06/28/2016. Risk Factors:Hypertension, Dyslipidemia and                 Sleep Apnea.  Sonographer:    Raquel Sarna Senior RDCS Referring Phys: [id number] ASHISH ARORA  Sonographer Comments: Technically difficult study due to patient body habitus and poor echo windows. IMPRESSIONS  1. Left ventricular ejection fraction, by estimation, is 55 to 60%. The left ventricle has normal function. The left ventricle has no regional wall motion abnormalities. There is moderate left ventricular hypertrophy. Left ventricular diastolic parameters are consistent with Grade II diastolic dysfunction (pseudonormalization).  2. Right ventricular systolic function is normal. The right ventricular size is mildly enlarged. Tricuspid regurgitation signal is inadequate for assessing PA pressure.  3. Left atrial size was moderately dilated.  4. Right atrial size was mildly dilated.  5. The mitral valve is normal in structure. No evidence of mitral  valve  regurgitation. No evidence of mitral stenosis.  6. The aortic valve is tricuspid. Aortic valve regurgitation is not visualized. No aortic stenosis is present.  7. Aortic dilatation noted. There is mild dilatation of the ascending aorta measuring 39 mm.  8. The inferior vena cava is dilated in size with <50% respiratory variability, suggesting right atrial pressure of 15 mmHg.  9. Technically difficult study with poor acoustic windows. FINDINGS  Left Ventricle: Left ventricular ejection fraction, by estimation, is 55 to 60%. The left ventricle has normal function. The left ventricle has no regional wall motion abnormalities. Definity contrast agent was given IV to delineate the left ventricular  endocardial borders. The left ventricular internal cavity size was normal in size. There is moderate left ventricular hypertrophy. Left ventricular diastolic parameters are consistent with Grade II diastolic dysfunction (pseudonormalization). Right Ventricle: The right ventricular size is mildly enlarged. No increase in right ventricular wall thickness. Right ventricular systolic function is normal. Tricuspid regurgitation signal is inadequate for assessing PA pressure. Left Atrium: Left atrial size was moderately dilated. Right Atrium: Right atrial size was mildly dilated. Pericardium: There is no evidence of pericardial effusion. Mitral Valve: The mitral valve is normal in structure. No evidence of mitral valve regurgitation. No evidence of mitral valve stenosis. Tricuspid Valve: The tricuspid valve is normal in structure. Tricuspid valve regurgitation is not demonstrated. Aortic Valve: The aortic valve is tricuspid. Aortic valve regurgitation is not visualized. No aortic stenosis is present. Pulmonic Valve: The pulmonic valve was normal in structure. Pulmonic valve regurgitation is not visualized. Aorta: Aortic dilatation noted. There is mild dilatation of the ascending aorta measuring 39 mm. Venous: The inferior vena cava  is dilated in size with less than 50% respiratory variability, suggesting right atrial pressure of 15 mmHg. IAS/Shunts: The interatrial septum was not well visualized.  LEFT VENTRICLE PLAX 2D LVIDd:         5.47 cm  Diastology LVIDs:         2.83 cm  LV e' lateral:   7.72 cm/s LV PW:         1.87 cm  LV E/e' lateral: 14.6 LV IVS:        1.67 cm  LV e' medial:    7.40 cm/s LVOT diam:     2.30 cm  LV E/e' medial:  15.3 LV SV:         94 LV SV Index:   33 LVOT Area:     4.15 cm  RIGHT VENTRICLE RV S prime:     16.90 cm/s TAPSE (M-mode): 3.2 cm LEFT ATRIUM              Index       RIGHT ATRIUM           Index LA diam:        3.50 cm  1.21 cm/m  RA Area:     25.10 cm LA Vol (A2C):   105.0 ml 36.39 ml/m RA Volume:   78.70 ml  27.27 ml/m LA Vol (A4C):   149.0 ml 51.64 ml/m LA Biplane Vol: 138.0 ml 47.82 ml/m  AORTIC VALVE LVOT Vmax:   112.00 cm/s LVOT Vmean:  70.000 cm/s LVOT VTI:    0.227 m  AORTA Ao Root diam: 3.60 cm Ao Asc diam:  3.85 cm MITRAL VALVE MV Area (PHT): 2.80 cm     SHUNTS MV Decel Time: 271 msec     Systemic VTI:  0.23 m MV E velocity: 113.00 cm/s  Systemic Diam: 2.30 cm MV A velocity: 112.00 cm/s MV E/A ratio:  1.01 Loralie Champagne MD Electronically signed by Loralie Champagne MD Signature Date/Time: 09/18/2019/4:52:31 PM    Final    VAS [place] LOWER EXTREMITY VENOUS (DVT)  Result Date: 09/24/2019  Lower Venous DVTStudy Indications: Stroke.  Limitations: Body habitus. Performing Technologist: June Leap RDMS, RVT  Examination Guidelines: A complete evaluation includes B-mode imaging, spectral Doppler, color Doppler, and power Doppler as needed of all accessible portions of each vessel. Bilateral testing is considered an integral part of a complete examination. Limited examinations for reoccurring indications may be performed as noted. The reflux portion of the exam is performed with the patient in reverse Trendelenburg.  +---------+---------------+---------+-----------+----------+--------------+ RIGHT     CompressibilityPhasicitySpontaneityPropertiesThrombus Aging +---------+---------------+---------+-----------+----------+--------------+ CFV      Full           Yes      Yes                                 +---------+---------------+---------+-----------+----------+--------------+ SFJ      Full                                                        +---------+---------------+---------+-----------+----------+--------------+ FV Prox  Full                                                        +---------+---------------+---------+-----------+----------+--------------+ FV Mid   Full                                                        +---------+---------------+---------+-----------+----------+--------------+ FV DistalFull                                                        +---------+---------------+---------+-----------+----------+--------------+ PFV      Full                                                        +---------+---------------+---------+-----------+----------+--------------+ POP      Full           Yes      Yes                                 +---------+---------------+---------+-----------+----------+--------------+ PTV      Full                                                        +---------+---------------+---------+-----------+----------+--------------+  PERO     Full                                                        +---------+---------------+---------+-----------+----------+--------------+   +---------+---------------+---------+-----------+----------+--------------+ LEFT     CompressibilityPhasicitySpontaneityPropertiesThrombus Aging +---------+---------------+---------+-----------+----------+--------------+ CFV      Full           Yes      Yes                                 +---------+---------------+---------+-----------+----------+--------------+ SFJ      Full                                                         +---------+---------------+---------+-----------+----------+--------------+ FV Prox  Full                                                        +---------+---------------+---------+-----------+----------+--------------+ FV Mid   Full                                                        +---------+---------------+---------+-----------+----------+--------------+ FV DistalFull                                                        +---------+---------------+---------+-----------+----------+--------------+ PFV      Full                                                        +---------+---------------+---------+-----------+----------+--------------+ POP      Full           Yes      Yes                                 +---------+---------------+---------+-----------+----------+--------------+ PTV      None                                         Acute          +---------+---------------+---------+-----------+----------+--------------+ PERO     Full                                                        +---------+---------------+---------+-----------+----------+--------------+  Summary: RIGHT: - There is no evidence of deep vein thrombosis in the lower extremity.  LEFT: - Findings consistent with acute deep vein thrombosis involving the left posterior tibial veins.  *See table(s) above for measurements and observations. Electronically signed by Deitra Mayo MD on 09/24/2019 at 4:05:02 PM.    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 He presented with acute onset of posterior headache, dizziness and vomiting on 09/17/2019. 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  Given recurrence of hemorrhage in the same location, further investigation with a diagnostic cerebral angiogram was recommended. EXAM: Diagnostic cerebral angiogram COMPARISON:  CT angiogram of the head July 01, 2016 MEDICATIONS: No antibiotics given. ANESTHESIA/SEDATION: Versed 0.5 mg IV; Fentanyl 50 mcg IV Moderate Sedation Time:  60 The patient was continuously monitored during the procedure by the interventional radiology nurse under my direct supervision. FLUOROSCOPY TIME:  Fluoroscopy Time: 14 minutes 30 seconds (1656 mGy). COMPLICATIONS: None immediate. TECHNIQUE: Informed written consent was obtained from the patient after a thorough discussion of the procedural risks, benefits and alternatives. All questions were addressed. Maximal Sterile Barrier Technique was utilized including caps, mask, sterile gowns, sterile gloves, sterile drape, hand hygiene and skin antiseptic. A timeout was performed prior to the initiation of the procedure. Using the modified Seldinger technique and a micropuncture kit, access was gained to the right radial artery at the wrist and a 5 French sheath was placed. Slow intra arterial infusion of 5,000 IU heparin, 5 mg Verapamil and 373 mcg nitroglicerin diluted in patient's own blood was performed. No significant fluctuation in patient's blood pressure seen. Then, a right radial artery angiogram was obtained via sheath side port. Normal brachial artery branching pattern seen. No significant anatomical variation. The right radial artery caliber is adequate for vascular access. Then, a 5 [place] Simmons 2 glide catheter was navigated over a 0.035 inch Terumo Glidewire into the right subclavian artery. A right subclavian artery roadmap was obtained. The catheter was then advanced into the V2 segment of the right vertebral artery. Townes, lateral and bilateral magnified oblique views of the head were obtained centered on the posterior fossa. The catheter was then placed into the aortic  arch and then advanced into the left common carotid artery. Left anterior oblique view of the neck was obtained followed by frontal, lateral and bilateral oblique views of the head. The catheter was then placed into the left subclavian artery. Frontal and lateral angiograms of the head were obtained followed by frontal the angiogram of the neck. The catheter was subsequently placed in the right common carotid artery. Right anterior oblique view of the neck was obtained. Following, frontal, lateral and bilateral oblique views of the head were obtained. The catheter was subsequently withdrawn. The right radial artery sheath was removed and inflatable band was placed over the access site for patent hemostasis. FINDINGS: Right radial artery ultrasound and right radial artery angiogram: The caliber of the distal right radial artery is appropriate for angiogram access. The right radial artery and the right ulnar artery have normal course and caliber. No significant anatomical variants noted. Right vertebral artery angiograms: The right vertebral artery, basilar artery, and bilateral posterior cerebral arteries are unremarkable. A dissecting aneurysm of the right posterior inferior cerebellar artery measuring approximately 5.4 x 2.8 mm at  the telovelotonsillar segment. No abnormally high-flow, early draining veins are seen. No regions of abnormal hypervascularity are noted. The visualized dural sinuses are patent. Left CCA angiograms: Cervical angiograms show normal course and caliber of the visualized left common carotid and internal carotid arteries. There are no significant stenoses. There is brisk vascular contrast filling of the the ACA and MCA vascular trees. Luminal caliber is smooth and tapering. No aneurysms or abnormally high-flow, early draining veins are seen. No regions of abnormal hypervascularity are noted. The intracranial branches of the left external carotid artery are unremarkable. The visualized dural  sinuses are patent. Left subclavian and thyrocervical angiograms: The visualized subclavian artery, proximal left vertebral artery and visualized branches of the thyrocervical trunk are unremarkable. Suboptimal opacification of the intracranial vasculature obtained. The left vertebral artery is codominant and of normal caliber. Right CCA angiograms: Cervical angiograms show normal course and caliber of the visualized right common carotid and internal carotid arteries. There are no significant stenoses. There is brisk vascular contrast filling of the ACA and MCA vascular trees. Luminal caliber is smooth and tapering. No aneurysms or abnormally high-flow, early draining veins are seen. No regions of abnormal hypervascularity are noted. The intracranial branches of the right external carotid artery are unremarkable. The visualized dural sinuses are patent. PROCEDURE: Not applicable. 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 PLAN: Angiogram findings discussed with the patient and his wife as well as with Dr. Erlinda Hong. Planned for angiogram with aneurysm embolization tomorrow. Electronically Signed   By: Pedro Earls M.D.   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 He presented with acute onset of posterior headache, dizziness and vomiting on 09/17/2019. 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 Given recurrence of hemorrhage in the same location, further investigation with a diagnostic cerebral angiogram was recommended. EXAM: Diagnostic cerebral angiogram COMPARISON:  CT angiogram of the  head July 01, 2016 MEDICATIONS: No antibiotics given. ANESTHESIA/SEDATION: Versed 0.5 mg IV; Fentanyl 50 mcg IV Moderate Sedation Time:  60 The patient was continuously monitored during the procedure by the interventional radiology nurse under my direct supervision. FLUOROSCOPY TIME:  Fluoroscopy Time: 14 minutes 30 seconds (1656 mGy). COMPLICATIONS: None immediate. TECHNIQUE: Informed written consent was obtained from the patient after a thorough discussion of the procedural risks, benefits and alternatives. All questions were addressed. Maximal Sterile Barrier Technique was utilized including caps, mask, sterile gowns, sterile gloves, sterile drape, hand hygiene and skin antiseptic. A timeout was performed prior to the initiation of the procedure. Using the modified Seldinger technique and a micropuncture kit, access was gained to the right radial artery at the wrist and a 5 French sheath was placed. Slow intra arterial infusion of 5,000 IU heparin, 5 mg Verapamil and 920 mcg nitroglicerin diluted in patient's own blood was performed. No significant fluctuation in patient's blood pressure seen. Then, a right radial artery angiogram was obtained via sheath side port. Normal brachial artery branching pattern seen. No significant anatomical variation. The right radial artery caliber is adequate for vascular access. Then, a 5 [place] Simmons 2 glide catheter was navigated over a 0.035 inch Terumo Glidewire into the right subclavian artery. A right subclavian artery roadmap was  obtained. The catheter was then advanced into the V2 segment of the right vertebral artery. Townes, lateral and bilateral magnified oblique views of the head were obtained centered on the posterior fossa. The catheter was then placed into the aortic arch and then advanced into the left common carotid artery. Left anterior oblique view of the neck was obtained followed by frontal, lateral and bilateral oblique views of the head. The catheter was  then placed into the left subclavian artery. Frontal and lateral angiograms of the head were obtained followed by frontal the angiogram of the neck. The catheter was subsequently placed in the right common carotid artery. Right anterior oblique view of the neck was obtained. Following, frontal, lateral and bilateral oblique views of the head were obtained. The catheter was subsequently withdrawn. The right radial artery sheath was removed and inflatable band was placed over the access site for patent hemostasis. FINDINGS: Right radial artery ultrasound and right radial artery angiogram: The caliber of the distal right radial artery is appropriate for angiogram access. The right radial artery and the right ulnar artery have normal course and caliber. No significant anatomical variants noted. Right vertebral artery angiograms: The right vertebral artery, basilar artery, and bilateral posterior cerebral arteries are unremarkable. 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 No abnormally high-flow, early draining veins are seen. No regions of abnormal hypervascularity are noted. The visualized dural sinuses are patent. Left CCA angiograms: Cervical angiograms show normal course and caliber of the visualized left common carotid and internal carotid arteries. There are no significant stenoses. There is brisk vascular contrast filling of the the ACA and MCA vascular trees. Luminal caliber is smooth and tapering. No aneurysms or abnormally high-flow, early draining veins are seen. No regions of abnormal hypervascularity are noted. The intracranial branches of the left external carotid artery are unremarkable. The visualized dural sinuses are patent. Left subclavian and thyrocervical angiograms: The visualized subclavian artery, proximal left vertebral artery and visualized branches of the thyrocervical trunk are unremarkable.  Suboptimal opacification of the intracranial vasculature obtained. The left vertebral artery is codominant and of normal caliber. Right CCA angiograms: Cervical angiograms show normal course and caliber of the visualized right common carotid and internal carotid arteries. There are no significant stenoses. There is brisk vascular contrast filling of the ACA and MCA vascular trees. Luminal caliber is smooth and tapering. No aneurysms or abnormally high-flow, early draining veins are seen. No regions of abnormal hypervascularity are noted. The intracranial branches of the right external carotid artery are unremarkable. The visualized dural sinuses are patent. PROCEDURE: Not applicable. 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 PLAN: Angiogram findings discussed with the patient and his wife as well as with Dr. Erlinda Hong. Planned for angiogram with aneurysm embolization tomorrow. Electronically Signed   By: Pedro Earls M.D.   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 He presented with acute onset of posterior headache, dizziness and vomiting on 09/17/2019. 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 EXAM: DIAGNOSTIC CEREBRAL ANGIOGRAM AND  ENDOVASCULAR EMBOLIZATION COMPARISON:  Diagnostic cerebral angiogram 09/18/2019 MEDICATIONS: No antibiotic administered. ANESTHESIA/SEDATION: The procedure was performed under general anesthesia. FLUOROSCOPY TIME:  Fluoroscopy Time: 74 minutes 42  seconds (5436 mGy). COMPLICATIONS: SIR LEVEL B - Normal therapy, includes overnight admission for observation. TECHNIQUE: Informed written consent was obtained from the patient after a thorough discussion of the procedural risks, benefits and alternatives. All questions were addressed. Maximal Sterile Barrier Technique was utilized including caps, mask, sterile gowns, sterile gloves, sterile drape, hand hygiene and skin antiseptic. A timeout was performed prior to the initiation of the procedure. Real-time ultrasound guidance was utilized for vascular access including the acquisition of a permanent ultrasound image documenting patency of the accessed vessel. Using the modified Seldinger technique and a micropuncture kit, access was gained to the right radial artery at the wrist and a 6 French sheath was placed. Slow intra arterial infusion of 5,000 IU heparin, 5 mg Verapamil and 200 mcg nitroglycerin diluted in patient's own blood was performed. No significant fluctuation in patient's blood pressure seen. Then, a right radial artery angiogram was obtained via sheath side port. Normal brachial artery branching pattern seen. No significant anatomical variation. The right radial artery caliber is adequate for vascular access. Then, a 5 [place] Simmons 2 glide catheter was navigated over a 0.035 inch Terumo Glidewire into the right subclavian artery. A right subclavian artery roadmap was obtained. The catheter was then advanced into the V2 segment of the right vertebral artery. Townes and lateral views of the head were obtained centered on the posterior fossa. 3D rotational angiograms were acquired and post processed in a separate workstation under concurrent attending physician supervision. Selected images were sent to PACS. FINDINGS: Again seen is a 5 mm dissecting aneurysm as seen on prior angiogram. However, it appears that this aneurysm originates from a dissected branch off of the telovelotonsillar segment of the  right posterior inferior cerebellar artery (PICA). The right vertebral artery, basilar artery, and bilateral posterior cerebral arteries are unremarkable. No abnormally high-flow, early draining veins are seen. No regions of abnormal hypervascularity are noted. The visualized dural sinuses are patent. PROCEDURE: Under biplane roadmap, the Simmons 2 catheter was removed over the wire. Intra arterial infusion of 5000 units of heparin, 200 mcg of nitroglycerin and 5 mg of verapamil was performed to the right radial artery via sheath side port. Then, a benchmark guide catheter was advanced over the wire and under fluoroscopic guidance into the distal V2 segment of the right vertebral artery. Magnified frontal and lateral views of the head were obtained in the working projections. Using biplane roadmap, a headway duo microcatheter was coaxially advanced over a synchro 2 micro guidewire into the right vertebral artery and then navigated into the right posteroinferior cerebellar artery. Frontal and lateral angiograms were obtained with microcatheter contrast injection. The microcatheter was advanced near the origin of the target vessel. Attempted placement of a 2 mm x 4 cm Target 360 Nano coil proved unsuccessful. Follow-up right vertebral artery angiograms with magnified frontal and lateral views of the head were projections obtained. The microcatheter was then advanced and wedged at the origin of the right targeted PICA branch. The microcatheter was then prepped with DMSO. Following, slow injection of onyx 18 was performed under fluoroscopy. Embolization of the target vessel achieved. Small reflux into the PICA noted. The catheter was removed under constant aspiration. Follow-up angiogram showed occlusion of PICA at the telovelotonsillar segment. Right vertebral artery angiograms with frontal and lateral views of the entire head to  evidence of distal thromboembolic complication it. Flat panel CT of the head was obtained  and post processed in a separate workstation with concurrent attending physician supervision. Selected images were sent to PACS. No evidence of hemorrhagic complication noted. The catheter was subsequently withdrawn. The right radial sheath was removed and inflatable band was placed over the access site for patent hemostasis. 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 PLAN: 1. Continued observation in ICU. 2. Stat head CT for any neurological deterioration. Patient and spouse communicated in person. Electronically Signed   By: Pedro Earls M.D.   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 He presented with acute onset of posterior headache, dizziness and vomiting on 09/17/2019. 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 Given recurrence of hemorrhage in the same location, further investigation with a diagnostic cerebral angiogram was recommended. EXAM: Diagnostic cerebral angiogram COMPARISON:  CT angiogram of the head July 01, 2016 MEDICATIONS: No antibiotics given. ANESTHESIA/SEDATION: Versed 0.5 mg IV; Fentanyl 50 mcg IV Moderate Sedation Time:  60 The patient was continuously monitored during the procedure by the interventional radiology nurse under my direct supervision. FLUOROSCOPY TIME:  Fluoroscopy Time: 14 minutes 30 seconds (1656 mGy). COMPLICATIONS: None immediate. TECHNIQUE: Informed written consent was obtained from the patient after a thorough discussion of the procedural risks, benefits and alternatives. All questions were addressed. Maximal Sterile Barrier Technique was utilized including caps, mask, sterile gowns, sterile  gloves, sterile drape, hand hygiene and skin antiseptic. A timeout was performed prior to the initiation of the procedure. Using the modified Seldinger technique and a micropuncture kit, access was gained to the right radial artery at the wrist and a 5 French sheath was placed. Slow intra arterial infusion of 5,000 IU heparin, 5 mg Verapamil and 517 mcg nitroglicerin diluted in patient's own blood was performed. No significant fluctuation in patient's blood pressure seen. Then, a right radial artery angiogram was obtained via sheath side port. Normal brachial artery branching pattern seen. No significant anatomical variation. The right radial artery caliber is adequate for vascular access. Then, a 5 [place] Simmons 2 glide catheter was navigated over a 0.035 inch Terumo Glidewire into the right subclavian artery. A right subclavian artery roadmap was obtained. The catheter was then advanced into the V2 segment of the right vertebral artery. Townes, lateral and bilateral magnified oblique views of the head were obtained centered on the posterior fossa. The catheter was then placed into the aortic arch and then advanced into the left common carotid artery. Left anterior oblique view of the neck was obtained followed by frontal, lateral and bilateral oblique views of the head. The catheter was then placed into the left subclavian artery. Frontal and lateral angiograms of the head were obtained followed by frontal the angiogram of the neck. The catheter was subsequently placed in the right common carotid artery. Right anterior oblique view of the neck was obtained. Following, frontal, lateral and bilateral oblique views of the head were obtained. The catheter was subsequently withdrawn. The right radial artery sheath was removed and inflatable band was placed over the access site for patent hemostasis. FINDINGS: Right radial artery ultrasound and right radial artery angiogram: The caliber of the distal right radial  artery  is appropriate for angiogram access. The right radial artery and the right ulnar artery have normal course and caliber. No significant anatomical variants noted. Right vertebral artery angiograms: The right vertebral artery, basilar artery, and bilateral posterior cerebral arteries are unremarkable. 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 No abnormally high-flow, early draining veins are seen. No regions of abnormal hypervascularity are noted. The visualized dural sinuses are patent. Left CCA angiograms: Cervical angiograms show normal course and caliber of the visualized left common carotid and internal carotid arteries. There are no significant stenoses. There is brisk vascular contrast filling of the the ACA and MCA vascular trees. Luminal caliber is smooth and tapering. No aneurysms or abnormally high-flow, early draining veins are seen. No regions of abnormal hypervascularity are noted. The intracranial branches of the left external carotid artery are unremarkable. The visualized dural sinuses are patent. Left subclavian and thyrocervical angiograms: The visualized subclavian artery, proximal left vertebral artery and visualized branches of the thyrocervical trunk are unremarkable. Suboptimal opacification of the intracranial vasculature obtained. The left vertebral artery is codominant and of normal caliber. Right CCA angiograms: Cervical angiograms show normal course and caliber of the visualized right common carotid and internal carotid arteries. There are no significant stenoses. There is brisk vascular contrast filling of the ACA and MCA vascular trees. Luminal caliber is smooth and tapering. No aneurysms or abnormally high-flow, early draining veins are seen. No regions of abnormal hypervascularity are noted. The intracranial branches of the right external carotid artery are unremarkable. The  visualized dural sinuses are patent. PROCEDURE: Not applicable. 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 PLAN: Angiogram findings discussed with the patient and his wife as well as with Dr. Erlinda Hong. Planned for angiogram with aneurysm embolization tomorrow. Electronically Signed   By: Pedro Earls M.D.   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 He presented with acute onset of posterior headache, dizziness and vomiting on 09/17/2019. 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 EXAM: DIAGNOSTIC CEREBRAL ANGIOGRAM AND ENDOVASCULAR EMBOLIZATION COMPARISON:  Diagnostic cerebral angiogram 09/18/2019 MEDICATIONS: No antibiotic administered. ANESTHESIA/SEDATION: The procedure was performed under general anesthesia. FLUOROSCOPY TIME:  Fluoroscopy Time: 74 minutes 42 seconds (5436 mGy). COMPLICATIONS: SIR LEVEL B - Normal therapy, includes overnight admission for observation. TECHNIQUE: Informed written consent was obtained from the patient after a thorough discussion of the procedural risks, benefits and alternatives. All questions were addressed. Maximal Sterile Barrier Technique was utilized including caps, mask, sterile gowns, sterile gloves, sterile drape, hand hygiene and skin antiseptic. A timeout was performed prior to the initiation of the procedure. Real-time ultrasound guidance was utilized for vascular access including the acquisition of a permanent ultrasound image  documenting patency of the accessed vessel. Using the modified Seldinger technique and a micropuncture kit, access was gained to the right radial artery at the wrist and a 6 French sheath was placed. Slow intra arterial infusion of 5,000 IU heparin, 5 mg Verapamil and 200 mcg nitroglycerin diluted in patient's own blood was performed. No significant  fluctuation in patient's blood pressure seen. Then, a right radial artery angiogram was obtained via sheath side port. Normal brachial artery branching pattern seen. No significant anatomical variation. The right radial artery caliber is adequate for vascular access. Then, a 5 [place] Simmons 2 glide catheter was navigated over a 0.035 inch Terumo Glidewire into the right subclavian artery. A right subclavian artery roadmap was obtained. The catheter was then advanced into the V2 segment of the right vertebral artery. Townes and lateral views of the head were obtained centered on the posterior fossa. 3D rotational angiograms were acquired and post processed in a separate workstation under concurrent attending physician supervision. Selected images were sent to PACS. FINDINGS: Again seen is a 5 mm dissecting aneurysm as seen on prior angiogram. However, it appears that this aneurysm originates from a dissected branch off of the telovelotonsillar segment of the right posterior inferior cerebellar artery (PICA). The right vertebral artery, basilar artery, and bilateral posterior cerebral arteries are unremarkable. No abnormally high-flow, early draining veins are seen. No regions of abnormal hypervascularity are noted. The visualized dural sinuses are patent. PROCEDURE: Under biplane roadmap, the Simmons 2 catheter was removed over the wire. Intra arterial infusion of 5000 units of heparin, 200 mcg of nitroglycerin and 5 mg of verapamil was performed to the right radial artery via sheath side port. Then, a benchmark guide catheter was advanced over the wire and under  fluoroscopic guidance into the distal V2 segment of the right vertebral artery. Magnified frontal and lateral views of the head were obtained in the working projections. Using biplane roadmap, a headway duo microcatheter was coaxially advanced over a synchro 2 micro guidewire into the right vertebral artery and then navigated into the right posteroinferior cerebellar artery. Frontal and lateral angiograms were obtained with microcatheter contrast injection. The microcatheter was advanced near the origin of the target vessel. Attempted placement of a 2 mm x 4 cm Target 360 Nano coil proved unsuccessful. Follow-up right vertebral artery angiograms with magnified frontal and lateral views of the head were projections obtained. The microcatheter was then advanced and wedged at the origin of the right targeted PICA branch. The microcatheter was then prepped with DMSO. Following, slow injection of onyx 18 was performed under fluoroscopy. Embolization of the target vessel achieved. Small reflux into the PICA noted. The catheter was removed under constant aspiration. Follow-up angiogram showed occlusion of PICA at the telovelotonsillar segment. Right vertebral artery angiograms with frontal and lateral views of the entire head to evidence of distal thromboembolic complication it. Flat panel CT of the head was obtained and post processed in a separate workstation with concurrent attending physician supervision. Selected images were sent to PACS. No evidence of hemorrhagic complication noted. The catheter was subsequently withdrawn. The right radial sheath was removed and inflatable band was placed over the access site for patent hemostasis. 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 PLAN: 1. Continued observation in ICU. 2. Stat head CT for any neurological deterioration. Patient and spouse communicated in person. Electronically Signed   By:  Pedro Earls M.D.   On: 09/20/2019 09:33       HISTORY OF PRESENT ILLNESS Andrew Perez is a 54 y.o. male with past medical history of hypertension, intraventricular hemorrhage involving the fourth ventricle in 2018, prolactinoma followed by Dr. Maurie Boettcher at [facility] and an endocrinologist at [facility], on cabergoline therapy March 2018 with decrease in mass size on  follow-up imaging being followed by surveillance with MRIs, morbid obesity, presents to the emergency room for evaluation of sudden onset headaches.  He says he was fine until 2 PM today 09/17/2019 ([facility]) when he suddenly had an acute onset of posterior headache.  He felt dizzy and had an episode of vomiting.  He was very nauseous and remains nauseous now.  Wife reports that he been feeling slightly off-she describes them as feeling a little withdrawn since yesterday but he did not start having any headaches since this afternoon.  Noncontrast head CT was done in the emergency room that revealed a small intraventricular hemorrhage in the fourth ventricle without hydrocephalus or any other acute abnormality. Neurology was consulted for admission for after the case was discussed with neurosurgery-no emergent neurosurgical intervention recommended. He was hypertensive with systolic blood pressure as high as 190 noted on this admission. Reports good control of blood pressure with usually systolics staying between 120-130 and regular follow-up during his neurosurgeon and endocrinologist for his prolactinoma. Premorbid modified Rankin scale (mRS): 1   HOSPITAL COURSE [name] is a 54 y.o. male with history of hypertension, intraventricular hemorrhage involving the fourth ventricle in 2018, prolactinoma followed by Dr. Maurie Boettcher at [facility], an endocrinologist at [facility], on cabergoline therapy March 2018 with decrease in mass size on follow-up imaging being followed by surveillance with MRIs, morbid  obesity, presenting with dizziness w/ episode of vomiting followed by headache. SBP > 190.  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 small volume IVH in 4th ventricle. Interval resection previous central skull base infiltrative mass. 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 No hydrocephalus. Known sellar mass w/ skull base erosion.   Cerebral angio right PICA 44m aneurysm   MRI - Evolving bilateral PICA territory infarct, similar in extension to prior CT. There is no hydrocephalus.   Repeat CT head 6/8 /21 evolving nonhemorrhagic cerebral infarcts right greater than left   2D Echo EF 55-60%. No source of embolus. LA moderate dilated  LDL 104  HgbA1c 4.7  UDS + opiates  SCDs for VTE prophylaxis  No antithrombotic prior to admission, was on No antithrombotic given hemorrhage. Post aneurysm embolization, now off heparin IV, on ASA 325. 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  No hydrocephalus  Off heparin IV, on aspirin 325  History of IVH  2018 - admitted for headache, MRI and CT showed IVH involving the 4th ventricle, and old right BG infarct.  Carotid Doppler negative.  EF 55 to 60%.  LDL 27 and A1c 4.6.  CTA head at that time showed right PICA patent.  Prolactinoma    Follow[facility] WMaur[facility] On cabergoline therapy since March 2018 with decrease in mass size on follow-up imaging  Being followed by surveillance with MRIs  Cabergoline every Wed and Sat  DVT  LE  dopplers +DVT L posterior tibial vein   Unable to anticoagulate given IVH  IVC filter placed 6/9   Hypertensive Emergency  SBP > 190 on  arrival   Home meds:  amlodipine 10, hydralazine 100 q8, losartan 100  Treated with cleviprex, now off  On amlodipine 10, clonidine 0.3 patch, hydralazine 100 tid, irbesartan 150 bid, spironolactone 50  SBP goal <160 at time of d/c  Long-term BP goal normotensive  Hyperlipidemia  LDL 104  on no statin PTA  Added lipitor 40 in the setting of ischemic stroke  Continue statin on discharge.  Other Stroke Risk Factors  Morbid Obesity, Body mass index is 58.36 kg/m., recommend weight loss, diet and exercise as appropriate   Obstructive sleep apnea  Other Active Problems  Hypothyroid on synthroid  Leukocytosis   Mild hyponatremia    DISCHARGE EXAM Blood pressure 119/87, pulse 64, temperature 98.1 F (36.7 C), temperature source Oral, resp. rate 20, height 5' 11"  (1.803 m), weight (!) 189.8 kg, SpO2 97 %. General -morbidly obese middle-aged Caucasian male well developed, not in acute distress.  Ophthalmologic - fundi not visualized due to noncooperation.  Cardiovascular - Regular rhythm and rate.  Neuro - awake alert and oriented to time place and person.. No aphasia, but paucity of speech, no dysarthria. Able to name and repeat. PERRL, EOMI, no nystagmus on bilateral gaze but saccadic dysmetria on left lateral gaze..  Facial symmetrical. visual field full. Tongue midline. Moving all extremities equally but lower extremities less than upper extremities. B/l FTN intact no ataxia. Sensation intact, gait not tested.   Discharge Diet  Regular thin liquids  DISCHARGE PLAN  Disposition:  Transfer to [place] for ongoing PT, OT and ST  aspirin 325 mg daily for secondary stroke prevention.   Recommend ongoing stroke risk factor control by Primary Care Physician at time of discharge from inpatient  rehabilitation.  Follow-up PCP in 2 weeks following discharge from rehab.  Follow-up in [facility] in 4 weeks following discharge from rehab, office to schedule an appointment.   Follow-up Katyucia de Sindy Messing MD (Interventional Neuroradiologist)  In 3 months following d/c. Office will call to sent up appt.   45 minutes were spent preparing discharge.  Burnetta Sabin, MSN, APRN, ANVP-BC, AGPCNP-BC Advanced Practice Stroke Nurse [place] for Schedule & Pager information 09/27/2019 2:34 PM  I have personally obtained history,examined this patient, reviewed notes, independently viewed imaging studies, participated in medical decision making and plan of care.ROS completed by me personally and pertinent positives fully documented  I have made any additions or clarifications directly to the above note. Agree with note above.    Antony Contras, MD Medical Director [facility] Pager: [phone] 09/27/2019 3:38 PM

## 2019-09-27 NOTE — Progress Notes (Signed)
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 Karenann Cai. Patient awake and alert sitting in bed eating breakfast. Not lethargic today. No complaints- denies headache or N/V.   Allergies: Codeine and Shellfish allergy  Medications: Prior to Admission medications   Medication Sig Start Date End Date Taking? Authorizing Provider  amLODipine (NORVASC) 10 MG tablet Take 1 tablet (10 mg total) by mouth daily. 07/04/16  Yes Rinehuls, Early Chars, PA-C  cabergoline (DOSTINEX) 0.5 MG tablet Take 0.5 mg by mouth 2 (two) times a week. Saturday and wednesday 06/24/16  Yes [provider]  cloNIDine (CATAPRES - DOSED IN MG/24 HR) 0.3 mg/24hr patch Place 0.3 mg onto the skin every 7 (seven) days. Saturday 03/12/18  Yes [provider]  hydrALAZINE (APRESOLINE) 100 MG tablet Take 1 tablet (100 mg total) by mouth every 8 (eight) hours. Patient taking differently: Take 100 mg by mouth 3 (three) times daily.  07/03/16  Yes Rinehuls, Early Chars, PA-C  levothyroxine (SYNTHROID, LEVOTHROID) 100 MCG tablet Take 100 mcg by mouth daily. 02/22/18  Yes [provider]  losartan (COZAAR) 100 MG tablet Take 100 mg by mouth daily. 03/20/18  Yes [provider]  cloNIDine (CATAPRES) 0.1 MG tablet One tablet three times daily Patient not taking: Reported on 04/13/2018 07/03/16   Rinehuls, Early Chars, PA-C  lisinopril (PRINIVIL,ZESTRIL) 20 MG tablet Take 1 tablet (20 mg total) by mouth 2 (two) times daily. Patient not taking: Reported on 04/13/2018 07/03/16   Rinehuls, Early Chars, PA-C     Vital Signs: BP 106/76   Pulse (!) 53   Temp 97.6 F (36.4 C) (Axillary)   Resp 14   Ht  5\' 11"  (1.803 m)   Wt (!) 418 lb 6.9 oz (189.8 kg)   SpO2 99%   BMI 58.36 kg/m   Physical Exam Vitals and nursing note reviewed.  Constitutional:      General: He is not in acute distress.    Appearance: Normal appearance.  Pulmonary:     Effort: Pulmonary effort is normal. No respiratory distress.  Skin:    General: Skin is warm and dry.  Neurological:     Mental Status: He is alert and oriented to person, place, and time.     Comments: No pronator drift. Can spontaneously move all extremities. EOMs intact bilaterally without nystagmus or subjective diplopia. No facial droop. Tongue midline.     Imaging: CT HEAD WO CONTRAST  Result Date: 09/24/2019 CLINICAL DATA:  Stroke.  Follow-up.  Lethargy. EXAM: CT HEAD WITHOUT CONTRAST TECHNIQUE: Contiguous axial images were obtained from the base of the skull through the vertex without intravenous contrast. COMPARISON:  MR head without contrast 09/20/2019. CT head without contrast 09/20/2019. 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 Liquid embolic agent within the right PICA again noted. Previously noted intraventricular hemorrhage has resolved. Progressive mass effect is noted. The frontal horns of the lateral ventricles are slightly more prominent than on the prior study. Asymmetry of the temporal horns is stable. Third ventricle is slightly more prominent. No new infarcts are present. Persistent generalized mass effect is present with effacement of the sulci. Vascular: Atherosclerotic changes are present within the cavernous internal carotid arteries  without a hyperdense vessel. Liquid embolic material is noted as above. Skull: Calvarium is intact. No focal lytic or blastic lesions are present. No significant extracranial soft tissue lesion is present. Sinuses/Orbits: The paranasal sinuses and mastoid air cells are clear. The globes and orbits are within normal limits. IMPRESSION: 1.  Evolving nonhemorrhagic cerebellar infarcts, right greater than left. 2. Liquid embolic material within the right PICA. 3. Progressive mass effect with effacement of the sulci in the posterior fossa and supratentorial structures. 4. Slight increase in size of the frontal horns of the lateral ventricles and third ventricle. Asymmetry of the temporal horns is stable. Question slight hydrocephalus. 5. No new infarcts. Electronically Signed   By: San Morelle M.D.   On: 09/24/2019 11:26   IR IVC FILTER PLMT / S&I Burke Keels GUID/MOD SED  Result Date: 09/25/2019 CLINICAL DATA:  Lower extremity DVT. Recurrent acute intracranial hemorrhage, a relative contraindication to anticoagulation. Caval filtration requested. EXAM: INFERIOR VENACAVOGRAM IVC FILTER PLACEMENT UNDER FLUOROSCOPY FLUOROSCOPY TIME:  96 seconds; 258 mGy TECHNIQUE: The procedure, risks (including but not limited to bleeding, infection, organ damage ), benefits, and alternatives were explained to the patient. Questions regarding the procedure were encouraged and answered. The patient understands and consents to the procedure. Patency of the right IJ vein was confirmed with ultrasound with image documentation. An appropriate skin site was determined. Skin site was marked, prepped with chlorhexidine, and draped using maximum barrier technique. The region was infiltrated locally with 1% lidocaine. Intravenous Fentanyl 12.38mcg and Versed 0.5mg  were administered as conscious sedation during continuous monitoring of the patient's level of consciousness and physiological / cardiorespiratory status by the radiology RN, with a total moderate sedation time of 14 minutes. Under real-time ultrasound guidance, the right IJ vein was accessed with a 21 gauge micropuncture needle; the needle tip within the vein was confirmed with ultrasound image documentation. The needle was exchanged over a 018 guidewire for a transitional dilator, which allow advancement of the  [facility] wire into the IVC. A long 6 French vascular sheath was placed for inferior venacavography. This demonstrated no caval thrombus. Renal vein inflows were evident. The [facility] IVC filter was advanced through the sheath and successfully deployed under fluoroscopy at the L2 level. Followup cavagram demonstrates stable filter position and no evident complication. The sheath was removed and hemostasis achieved at the site. No immediate complication. IMPRESSION: 1. Normal IVC. No thrombus or significant anatomic variation. 2. Technically successful infrarenal IVC filter placement. This is a retrievable model. PLAN: This IVC filter is potentially retrievable. The patient will be assessed for filter retrieval by Interventional Radiology in approximately 8-12 weeks. Further recommendations regarding filter retrieval, continued surveillance or declaration of device permanence, will be made at that time. Electronically Signed   By: Lucrezia Europe M.D.   On: 09/25/2019 13:21   VAS [place] LOWER EXTREMITY VENOUS (DVT)  Result Date: 09/24/2019  Lower Venous DVTStudy Indications: Stroke.  Limitations: Body habitus. Performing Technologist: June Leap RDMS, RVT  Examination Guidelines: A complete evaluation includes B-mode imaging, spectral Doppler, color Doppler, and power Doppler as needed of all accessible portions of each vessel. Bilateral testing is considered an integral part of a complete examination. Limited examinations for reoccurring indications may be performed as noted. The reflux portion of the exam is performed with the patient in reverse Trendelenburg.  +---------+---------------+---------+-----------+----------+--------------+ RIGHT    CompressibilityPhasicitySpontaneityPropertiesThrombus Aging +---------+---------------+---------+-----------+----------+--------------+ CFV      Full           Yes  Yes                                  +---------+---------------+---------+-----------+----------+--------------+ SFJ      Full                                                        +---------+---------------+---------+-----------+----------+--------------+ FV Prox  Full                                                        +---------+---------------+---------+-----------+----------+--------------+ FV Mid   Full                                                        +---------+---------------+---------+-----------+----------+--------------+ FV DistalFull                                                        +---------+---------------+---------+-----------+----------+--------------+ PFV      Full                                                        +---------+---------------+---------+-----------+----------+--------------+ POP      Full           Yes      Yes                                 +---------+---------------+---------+-----------+----------+--------------+ PTV      Full                                                        +---------+---------------+---------+-----------+----------+--------------+ PERO     Full                                                        +---------+---------------+---------+-----------+----------+--------------+   +---------+---------------+---------+-----------+----------+--------------+ LEFT     CompressibilityPhasicitySpontaneityPropertiesThrombus Aging +---------+---------------+---------+-----------+----------+--------------+ CFV      Full           Yes      Yes                                 +---------+---------------+---------+-----------+----------+--------------+ SFJ      Full                                                        +---------+---------------+---------+-----------+----------+--------------+  FV Prox  Full                                                         +---------+---------------+---------+-----------+----------+--------------+ FV Mid   Full                                                        +---------+---------------+---------+-----------+----------+--------------+ FV DistalFull                                                        +---------+---------------+---------+-----------+----------+--------------+ PFV      Full                                                        +---------+---------------+---------+-----------+----------+--------------+ POP      Full           Yes      Yes                                 +---------+---------------+---------+-----------+----------+--------------+ PTV      None                                         Acute          +---------+---------------+---------+-----------+----------+--------------+ PERO     Full                                                        +---------+---------------+---------+-----------+----------+--------------+     Summary: RIGHT: - There is no evidence of deep vein thrombosis in the lower extremity.  LEFT: - Findings consistent with acute deep vein thrombosis involving the left posterior tibial veins.  *See table(s) above for measurements and observations. Electronically signed by Deitra Mayo MD on 09/24/2019 at 4:05:02 PM.    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 Karenann Cai. Patient's condition stable-less lethargic today, stillneurologically intact andasymptomatic at this time. For D/C to CIR pending bed assignment; if no bed assignment for transfer to progressive unit today. Continuetaking Aspirin 325 mg once daily. Plan for follow-up with CTA head/neck (with contrast) followed by consult with Dr. Karenann Cai 3 months after discharge- our schedulers to call patient to set up this appointment. Further plans per neurology- appreciate and agree with management. Please call NIR with questions/concerns.   Electronically Signed: Earley Abide, PA-C 09/27/2019, 10:13 AM   I spent a total of 25 Minutes at the the patient's bedside AND on the patient's hospital floor or unit, greater than 50% of which was counseling/coordinating care for dissecting right PICA aneurysm s/p embolization.

## 2019-09-27 NOTE — H&P (Signed)
Physical Medicine and Rehabilitation Admission H&P    Chief Complaint  Patient presents with  . Stroke with functional deficits  . 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  He was noted to be hypertensive at admission and found to have small acute IVH involving fourth ventricle.  Bleed was felt to be hypertensive in nature and he was started on Cleviprex for BP management.  Dr. Ronnald Ramp was consulted for input and recommended CT angio for work-up as well as SBP less than 140.  And has signed off and is no hydrocephalus noted.  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 Christophe Louis on 06/03.  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  MRI brain confirmed evolving infarct similar in extension compared with prior CT.  2D echo showed EF 55 to 60%, moderate LVH and moderately dilated left atrium.  Patient placed on aspirin for secondary stroke prevention.    His blood pressure continued to be labile requiring Cleviprex.  BP meds added and titrated upwards and Cleviprex weaned off yesterday.  He has had fluctuating mental status and repeat CT head 6/8 showed evolving nonhemorrhagic cerebellar infarcts right greater than left, progressive mass-effect with effacement of sulci posterior fossa/supratentorial structures, and slight increase in size of frontal horn of lateral ventricles and third ventricle. BLE Dopplers done due to LE edema  06/08 revealing DVT in left posterior tibial vein and IVC filter placed by radiology on 06/09 as not felt to be a candidate for anticoagulation.  His lethargy is resolving but he continues to be limited by left-sided  weakness with unsteady gait.  CIR was recommended due to functional decline   Review of Systems  Constitutional: Negative for chills and fever.  HENT: Negative for hearing loss and tinnitus.   Eyes: Negative for blurred vision and double vision.  Respiratory: Negative for cough and shortness of breath.   Cardiovascular: Positive for leg swelling (wears Rx compression stockings). Negative for chest pain and palpitations.  Gastrointestinal: Negative for constipation, heartburn and nausea.  Genitourinary: Negative for dysuria and urgency.  Musculoskeletal: Negative for joint pain and myalgias.  Skin: Negative for itching and rash.  Neurological: Positive for weakness. Negative for dizziness and headaches.  Psychiatric/Behavioral: The patient is not nervous/anxious and does not have insomnia.      Past Medical History:  Diagnosis Date  . Hypertension   . IVH (intraventricular hemorrhage) ([place]) 2018   due to HTN  . Prolactinoma ([place])    followed by [facility]  . Sleep apnea     Past Surgical History:  Procedure Laterality Date  . IR 3D INDEPENDENT WKST  09/19/2019  . IR ANGIO INTRA EXTRACRAN SEL COM CAROTID INNOMINATE BILAT MOD SED  09/18/2019  . IR ANGIO VERTEBRAL SEL SUBCLAVIAN INNOMINATE UNI L MOD SED  09/18/2019  . IR ANGIO VERTEBRAL SEL VERTEBRAL UNI R MOD SED  09/18/2019  . IR ANGIO VERTEBRAL SEL VERTEBRAL UNI R MOD SED  09/19/2019  . IR ANGIOGRAM FOLLOW UP STUDY  09/19/2019  . IR CT HEAD LTD  09/19/2019  . IR IVC FILTER PLMT / S&I /IMG GUID/MOD SED  09/25/2019  .  IR NEURO EACH ADD'L AFTER BASIC UNI RIGHT (MS)  09/19/2019  . IR TRANSCATH/EMBOLIZ  09/19/2019  . IR US GUIDE VASC ACCESS RIGHT  09/18/2019  . IR US GUIDE VASC ACCESS RIGHT  09/19/2019  . RADIOLOGY WITH ANESTHESIA N/A 09/19/2019   Procedure: RADIOLOGY WITH ANESTHESIA ANUERYSM COILING;  Surgeon: Pedro Earls, MD;  Location: [place];  Service: Radiology;  Laterality: N/A;  . TONSILLECTOMY     Family History  Problem  Relation Age of Onset  . Colon cancer Father     Social History:  Married. Pastor--still works. Independent without AD. He reports that he has never smoked. He has never used smokeless tobacco. He reports that he does not drink alcohol and does not use drugs.   Allergies  Allergen Reactions  . Codeine Shortness Of Breath  . Shellfish Allergy Shortness Of Breath    Medications Prior to Admission  Medication Sig Dispense Refill  . amLODipine (NORVASC) 10 MG tablet Take 1 tablet (10 mg total) by mouth daily. 30 tablet 6  . cabergoline (DOSTINEX) 0.5 MG tablet Take 0.5 mg by mouth 2 (two) times a week. Saturday and wednesday  0  . cloNIDine (CATAPRES - DOSED IN MG/24 HR) 0.3 mg/24hr patch Place 0.3 mg onto the skin every 7 (seven) days. Saturday    . hydrALAZINE (APRESOLINE) 100 MG tablet Take 1 tablet (100 mg total) by mouth every 8 (eight) hours. (Patient taking differently: Take 100 mg by mouth 3 (three) times daily. ) 90 tablet 6  . levothyroxine (SYNTHROID, LEVOTHROID) 100 MCG tablet Take 100 mcg by mouth daily.    Marland Kitchen losartan (COZAAR) 100 MG tablet Take 100 mg by mouth daily.    . cloNIDine (CATAPRES) 0.1 MG tablet One tablet three times daily (Patient not taking: Reported on 04/13/2018) 90 tablet 6  . lisinopril (PRINIVIL,ZESTRIL) 20 MG tablet Take 1 tablet (20 mg total) by mouth 2 (two) times daily. (Patient not taking: Reported on 04/13/2018) 60 tablet 6    Drug Regimen Review  Drug regimen was reviewed and remains appropriate with no significant issues identified  Home: Home Living Family/patient expects to be discharged to:: Private residence Living Arrangements: Spouse/significant other Available Help at Discharge: Family Type of Home: House Home Access: Stairs to enter Technical brewer of Steps: 4-5 Entrance Stairs-Rails: Right, Left, Can reach both Home Layout: One level Bathroom Shower/Tub: Multimedia programmer: Handicapped height Bathroom  Accessibility: Yes Home Equipment: Environmental consultant - 2 wheels, Bedside commode, Wheelchair - manual, Shower seat  Lives With: Spouse, Son   Functional History: Prior Function Level of Independence: Independent Comments: works as a Geophysicist/field seismologist Status:  Mobility: Bed Mobility Overal bed mobility: Needs Assistance Bed Mobility: Supine to Sit Supine to sit: HOB elevated, Min assist Sit to supine: Max assist, +2 for physical assistance (for LE management and trunk control descending) General bed mobility comments: +rail, increased time, cues for sequencing, assist to elevate trunk Transfers Overall transfer level: Needs assistance Equipment used: Rolling walker (2 wheeled) Transfers: Sit to/from Stand Sit to Stand: Min assist, From elevated surface General transfer comment: cues for hand placement, assist to power up, increased time to stabilize balance Ambulation/Gait Ambulation/Gait assistance: Min assist Gait Distance (Feet): 5 Feet Assistive device: Rolling walker (2 wheeled) Gait Pattern/deviations: Step-to pattern, Wide base of support General Gait Details: guarded gait, assist to maintain balance Gait velocity: decreased Gait velocity interpretation: <1.8 ft/sec, indicate of risk for recurrent falls    ADL: ADL Overall ADL's :  Needs assistance/impaired Eating/Feeding: Minimal assistance Eating/Feeding Details (indicate cue type and reason): poor depth perception; unable to locate utensils on tray without VC; able to complete hand to mouth pattern however pt's wife has been feeding him. Educated wife on need for pt to self feed as much as possible Grooming: Minimal assistance, Sitting Upper Body Bathing: Minimal assistance, Sitting Lower Body Bathing: Total assistance Toilet Transfer: +2 for physical assistance, Moderate assistance, Stand-pivot, RW Toilet Transfer Details (indicate cue type and reason): simulated OOB to chair Toileting - Clothing Manipulation Details  (indicate cue type and reason): total Functional mobility during ADLs: +2 for physical assistance, Moderate assistance, Rolling walker General ADL Comments: pt with decrease balance with RW but reports "its fine"  Cognition: Cognition Overall Cognitive Status: Impaired/Different from baseline Orientation Level: Oriented X4 Cognition Arousal/Alertness: Awake/alert Behavior During Therapy: Flat affect Overall Cognitive Status: Impaired/Different from baseline Area of Impairment: Attention, Memory, Following commands, Safety/judgement, Awareness, Problem solving Orientation Level: Time (June 12th) Current Attention Level: Sustained Memory: Decreased recall of precautions, Decreased short-term memory Following Commands: Follows one step commands consistently Safety/Judgement: Decreased awareness of safety, Decreased awareness of deficits Awareness: Intellectual Problem Solving: Slow processing, Requires verbal cues General Comments: pt delayed response requiring freq verbal cues to stay on task and complete task   Blood pressure (!) 154/60, pulse (!) 46, temperature 98.1 F (36.7 C), temperature source Oral, resp. rate 14, height 5\' 11"  (1.803 m), weight (!) 189.8 kg, SpO2 98 %. Physical Exam  Nursing note and vitals reviewed. Constitutional: He is oriented to person, place, and time. He appears lethargic. He is cooperative.  HENT:  Head: Normocephalic.  Eyes: Pupils are equal, round, and reactive to light.  Cardiovascular: Normal rate and regular rhythm.  Respiratory: Effort normal and breath sounds normal. No respiratory distress. He has no wheezes. He has no rhonchi.  Musculoskeletal:        General: Swelling (1+ edema bilateral hand and pedally) present.     Cervical back: Normal range of motion.  Neurological: He is oriented to person, place, and time. He appears lethargic. No cranial nerve deficit or sensory deficit.  UE: 3-/5 prox to 4-/5 distally. LE: 3- HF, KE and 3+ ADF/PF.  No focal sensory deficits. Reasonable insight and awareness. No dysmetria, good depth perception  Skin:  Bruise on bridge of nose (due to CPAP)  Psychiatric:  Pt is flat but very cooperative, fatigues.     No results found for this or any previous visit (from the past 48 hour(s)). No results found.     Medical Problem List and Plan: 1.  Functional and mobility deficits secondary to 4th ventricle hemorrhage and right PICA aneurysm s/p embolization with subsequent R>L cerebellar infarcts  -patient may  shower  -ELOS/Goals: mod I to supervision/ 13-15 days 2.  Left posterior tib DVT/Antithrombotics: -DVT/anticoagulation:  Mechanical: Sequential compression devices, below knee Right lower extremity and compression stockings. Recheck dopplers next week  -antiplatelet therapy: ASA 3. Pain Management: Tylenol prn 4. Mood: LCSW to follow for evaluation and support.   -antipsychotic agents: N/A 5. Neuropsych: This patient is capable of making decisions on his own behalf. 6. Skin/Wound Care: Routine pressure relief measures.  7. Fluids/Electrolytes/Nutrition: Monitor I/O. Check lytes in am. 8. HTN: Monitor BP tid--continue catapres TTS-3 patch, Norvasc, Hydralazine, HCTZ, Avapro and aldactone.  -monitor BP with activity 9. Prolactinoma s/p resction: On DOstinex 2 x week. 10. OSA: Continue CPAP whenever napping and at nights. Asked patient to have wife bring his home facemask  11.  Hypoxia: Likely multifactorial- body habitus, intermittent napping, OSA. Encourage IS and wean oxygen to off.  12. Morbid Obesity: BMI-58. Heart Healthy diet. Will have dietician educate on appropriate diet.  Encourage weight loss to help promote  health and mobility.  13. Bradycardia: Intermittent and asymptomatic. Will monitor for now.  14. Hyponatremia: Will recheck labs tomorrow as Na on downward trend.   -most recently 131 15. Leucocytosis/ABLA: 13.5-->11.5  - Recheck CBC in am.  16. Impaired Fasting  Glucose: Hgb A1c- 4.7.   17. Dyslipidemia: On Lipitor.        Bary Leriche, PA-C 09/27/2019

## 2019-09-27 NOTE — TOC Transition Note (Signed)
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  PTA, pt independent, lives at home with spouse.  Pt medically stable for discharge today to [facility] IP Rehab, as insurance Josem Kaufmann has been obtained.      Final next level of care: IP Rehab Facility Barriers to Discharge: Barriers Resolved            Discharge Plan and Services     Post Acute Care Choice: IP Rehab                               Social Determinants of Health (SDOH) Interventions     Readmission Risk Interventions No flowsheet data found.  Reinaldo Raddle, RN, BSN  Trauma/Neuro ICU Case Manager [phone]

## 2019-09-27 NOTE — Progress Notes (Signed)
Inpatient Rehabilitation-Admissions Coordinator   Received insurance approval and medical clearance from attending service for admit to CIR today. Pt and his wife notified of bed offer and they have accepted. I reviewed insurance benefits letter and consent forms with both the patient and his wife. RN and [facility] team aware of plan for admit today.   Please call if questions.   Raechel Ache, OTR/L  Rehab Admissions Coordinator  [phone] 09/27/2019 12:01 PM

## 2019-09-27 NOTE — Progress Notes (Signed)
Meredith Staggers, MD  Physician  Physical Medicine and Rehabilitation  Consult Note      Signed  Date of Service:  09/23/2019  8:41 AM      Related encounter: ED to Hosp-Admission (Discharged) from 09/17/2019 in [facility]UR[facility]/SURGICAL ICU      Signed      Expand All Collapse All  Show:Clear all [x] Manual[x] Template[] Copied  Added by: [x] Love, Ivan Anchors, PA-C[x] Meredith Staggers, MD  [] Hover for details          Physical Medicine and Rehabilitation Consult     Reason for Consult: Stroke with functional deficits.  Referring Physician: Dr. Erlinda Hong     HPI: Andrew Perez is a 54 y.o. male with history of HTN, morbid obesity--BMI 80, IVH-'18, OSA, prolactinoma who was admitted on 09/17/19 with acute onset of HA with dizziness and episode of vomiting. 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 Bleed felt to be hypertensive in nature. He was started on cleviprex for BP management and Dr. Ronnald Ramp consulted for input. CT angio recommended for work up with SBP<140 and NS signed off as no hydrocephalus noted.  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 Katherina Right on 06/03. Post procedure with nausea and Repeat CT head showed right more than left cerebellar infarct and MRI confirmed evolving R>L PIC territory infarct --similar in extension c/w prior CT.   2D echo showed EF 55-60% and moderate LVH and moderately dilated left atrium. Now on ASA for secondary stroke prevention. BP remains labile requiring Cleviprex. Therapy ongoing and CIR recommended due to balance deficits affecting functional status.      Review of Systems  Constitutional: Positive for malaise/fatigue.  HENT: Negative for hearing loss.   Eyes: Positive for blurred vision.  Respiratory: Negative for cough.   Cardiovascular: Negative  for chest pain.  Gastrointestinal: Negative for vomiting.  Musculoskeletal: Negative for neck pain.  Skin: Negative for rash.  Neurological: Positive for dizziness and weakness.            Past Medical History:  Diagnosis Date  . Hypertension    . IVH (intraventricular hemorrhage) ([place]) 2018    due to HTN  . Prolactinoma ([place])      followed by [facility]  . Sleep apnea             Past Surgical History:  Procedure Laterality Date  . IR 3D INDEPENDENT WKST   09/19/2019  . IR ANGIO INTRA EXTRACRAN SEL COM CAROTID INNOMINATE BILAT MOD SED   09/18/2019  . IR ANGIO VERTEBRAL SEL SUBCLAVIAN INNOMINATE UNI L MOD SED   09/18/2019  . IR ANGIO VERTEBRAL SEL VERTEBRAL UNI R MOD SED   09/18/2019  . IR ANGIO VERTEBRAL SEL VERTEBRAL UNI R MOD SED   09/19/2019  . IR ANGIOGRAM FOLLOW UP STUDY   09/19/2019  . IR CT HEAD LTD   09/19/2019  . IR NEURO EACH ADD'L AFTER BASIC UNI RIGHT (MS)   09/19/2019  . IR TRANSCATH/EMBOLIZ   09/19/2019  . IR US GUIDE VASC ACCESS RIGHT   09/18/2019  . IR US GUIDE VASC ACCESS RIGHT   09/19/2019  . RADIOLOGY WITH ANESTHESIA N/A 09/19/2019    Procedure: RADIOLOGY WITH ANESTHESIA ANUERYSM COILING;  Surgeon: Pedro Earls, MD;  Location: [facility];  Service: Radiology;  Laterality:  N/A;  . TONSILLECTOMY               Family History  Problem Relation Age of Onset  . Colon cancer Father        Social History:  reports that he has never smoked. He has never used smokeless tobacco. He reports that he does not drink alcohol or use drugs.          Allergies  Allergen Reactions  . Codeine Shortness Of Breath  . Shellfish Allergy Shortness Of Breath      Medications Prior to Admission  Medication Sig Dispense Refill  . amLODipine (NORVASC) 10 MG tablet Take 1 tablet (10 mg total) by mouth daily. 30 tablet 6  . cabergoline (DOSTINEX) 0.5 MG tablet Take 0.5 mg by mouth 2 (two) times a week. Saturday and wednesday   0  . cloNIDine (CATAPRES - DOSED IN MG/24 HR) 0.3  mg/24hr patch Place 0.3 mg onto the skin every 7 (seven) days. Saturday      . hydrALAZINE (APRESOLINE) 100 MG tablet Take 1 tablet (100 mg total) by mouth every 8 (eight) hours. (Patient taking differently: Take 100 mg by mouth 3 (three) times daily. ) 90 tablet 6  . levothyroxine (SYNTHROID, LEVOTHROID) 100 MCG tablet Take 100 mcg by mouth daily.      Marland Kitchen losartan (COZAAR) 100 MG tablet Take 100 mg by mouth daily.      . cloNIDine (CATAPRES) 0.1 MG tablet One tablet three times daily (Patient not taking: Reported on 04/13/2018) 90 tablet 6  . lisinopril (PRINIVIL,ZESTRIL) 20 MG tablet Take 1 tablet (20 mg total) by mouth 2 (two) times daily. (Patient not taking: Reported on 04/13/2018) 60 tablet 6      Home: [place] expects to be discharged to:: Private residence Living Arrangements: Spouse/significant other Available Help at Discharge: Family Type of Home: House Home Access: Stairs to enter Technical brewer of Steps: 4-5 Entrance Stairs-Rails: Right, Left, Can reach both Home Layout: One level Bathroom Shower/Tub: Multimedia programmer: Handicapped height Bathroom Accessibility: Yes Home Equipment: Environmental consultant - 2 wheels, Bedside commode, Wheelchair - manual, Shower seat  Lives With: Spouse, Son  Functional History: Prior Function Level of Independence: Independent Comments: works as a Tax adviser Status:  Mobility: [place] bed mobility: Needs Assistance Bed Mobility: Supine to Sit Supine to sit: Supervision Sit to supine: Mod assist General bed mobility comments: modA to return to supine for LE management Transfers Overall transfer level: Needs assistance Equipment used: Rolling walker (2 wheeled) Transfers: Sit to/from Stand Sit to Stand: Min assist, +2 physical assistance, From elevated surface(modAx2 from recliner) General transfer comment: pt requires increased assistance to power up from lower seat, verbal cues provided for  hand placement Ambulation/Gait Ambulation/Gait assistance: Min assist, +2 physical assistance(with 3rd person for chair follow) Gait Distance (Feet): 12 Feet Assistive device: Rolling walker (2 wheeled) Gait Pattern/deviations: Step-to pattern, Trunk flexed, Staggering left, Staggering right General Gait Details: pt with increased lateral sway drifting and intermittently staggering left/right. Pt with increased trunk flexion and requiring multiple cues to bring RW closer to BOS. Pt will likely benefit from wider bariatric RW at next session to allow closer proximity to his base of support Gait velocity: reduced Gait velocity interpretation: <1.8 ft/sec, indicate of risk for recurrent falls   ADL: ADL Overall ADL's : Needs assistance/impaired Eating/Feeding: Set up, Sitting Eating/Feeding Details (indicate cue type and reason): pt is extending head and shifting weight to place objects in mouth  Grooming: Min guard, Sitting Upper Body Bathing: Minimal assistance Lower Body Bathing: Total assistance Toilet Transfer: +2 for physical assistance, Moderate assistance, Stand-pivot, RW Toilet Transfer Details (indicate cue type and reason): simulated OOB to chair Toileting - Clothing Manipulation Details (indicate cue type and reason): total Functional mobility during ADLs: +2 for physical assistance, Moderate assistance, Rolling walker General ADL Comments: pt with decrease balance with RW but reports "its fine"   Cognition: Cognition Overall Cognitive Status: Impaired/Different from baseline Orientation Level: Oriented to person, Oriented to place, Oriented to situation Cognition Arousal/Alertness: Awake/alert Behavior During Therapy: Flat affect Overall Cognitive Status: Impaired/Different from baseline Area of Impairment: Attention, Memory, Safety/judgement, Following commands, Awareness, Problem solving Current Attention Level: Sustained Memory: Decreased recall of precautions, Decreased  short-term memory Following Commands: Follows one step commands inconsistently, Follows one step commands with increased time Safety/Judgement: Decreased awareness of safety, Decreased awareness of deficits Awareness: Intellectual Problem Solving: Slow processing, Decreased initiation, Difficulty sequencing General Comments: pt giving very short brisk responses to open ended questions. pt state vision is not blurry but then states back of his head is blurry. pt then with more cueing states his vision is blurry until he focuses on the object then its not blurry . pt reports its been happening. when pushed to define states few days pt then further states it started after the coiling procedure.    Blood pressure 127/60, pulse 65, temperature 99.7 F (37.6 C), temperature source Oral, resp. rate 14, height 5\' 11"  (1.803 m), weight (!) 189.8 kg, SpO2 99 %. Physical Exam  Constitutional:  Obese male lying in bed  HENT:  Head: Normocephalic and atraumatic.  Eyes: Pupils are equal, round, and reactive to light.  Cardiovascular: Normal rate.  Respiratory: Effort normal.  GI: He exhibits distension.  Musculoskeletal:     Cervical back: Normal range of motion.     Comments: 2+ LE edema, chronic  Neurological:  Awakens to verbal and tactile stim. Follows basic commands. Falls back to sleep quickly. UE 4/5 prox to distal. LE 3/5 HF/KE and 3-4/5 distally with inconsistent effort. Ataxic/depth perception difficulties with UE movement. Speech dysarthric  Skin: Skin is warm.  Psychiatric:  Flat but cooperative      Lab Results Last 24 Hours  No results found for this or any previous visit (from the past 24 hour(s)).   Imaging Results (Last 48 hours)  No results found.       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 Does the need for close, 24 hr/day medical supervision in concert with the patient's rehab needs make it  unreasonable for this patient to be served in a less intensive setting? Yes 2. Co-Morbidities requiring supervision/potential complications: morbid obesity, HTN 3. Due to bladder management, bowel management, safety, skin/wound care, disease management, medication administration, pain management and patient education, does the patient require 24 hr/day rehab nursing? Yes 4. Does the patient require coordinated care of a physician, rehab nurse, therapy disciplines of PT, OT, SLP to address physical and functional deficits in the context of the above medical diagnosis(es)? Yes Addressing deficits in the following areas: balance, endurance, locomotion, strength, transferring, bowel/bladder control, bathing, dressing, feeding, grooming, toileting, cognition, speech and psychosocial support 5. Can the patient actively participate in an intensive therapy program of at least 3 hrs of therapy per day at least 5 days per week? Yes 6. The potential for patient to make measurable gains while on inpatient rehab is excellent 7. Anticipated functional  outcomes upon discharge from inpatient rehab are modified independent and supervision  with PT, modified independent and supervision with OT, modified independent with SLP. 8. Estimated rehab length of stay to reach the above functional goals is: 13-16 days 9. Anticipated discharge destination: Home 10. Overall Rehab/Functional Prognosis: excellent   RECOMMENDATIONS: This patient's condition is appropriate for continued rehabilitative care in the following setting: CIR Patient has agreed to participate in recommended program. Yes Note that insurance prior authorization may be required for reimbursement for recommended care.   Comment: Rehab Admissions Coordinator to follow up.   Thanks,   Meredith Staggers, MD, Mellody Drown   I have personally performed a face to face diagnostic evaluation of this patient. Additionally, I have examined pertinent labs and  radiographic images. I have reviewed and concur with the physician assistant's documentation above.     Bary Leriche, PA-C 09/23/2019        Revision History                     Routing History                Note Details  Author Meredith Staggers, MD File Time 09/23/2019 12:47 PM  Author Type Physician Status Signed  Last Editor Meredith Staggers, MD Service Physical Medicine and [place] # [phone] Admit Date 09/27/2019

## 2019-09-28 ENCOUNTER — Inpatient Hospital Stay (HOSPITAL_COMMUNITY): Payer: 59 | Admitting: Physical Therapy

## 2019-09-28 ENCOUNTER — Inpatient Hospital Stay (HOSPITAL_COMMUNITY): Payer: 59 | Admitting: Occupational Therapy

## 2019-09-28 ENCOUNTER — Inpatient Hospital Stay (HOSPITAL_COMMUNITY): Payer: 59

## 2019-09-28 DIAGNOSIS — E871 Hypo-osmolality and hyponatremia: Secondary | ICD-10-CM

## 2019-09-28 LAB — BASIC METABOLIC PANEL
Anion gap: 10 (ref 5–15)
BUN: 16 mg/dL (ref 6–20)
CO2: 31 mmol/L (ref 22–32)
Calcium: 9.1 mg/dL (ref 8.9–10.3)
Chloride: 99 mmol/L (ref 98–111)
Creatinine, Ser: 0.76 mg/dL (ref 0.61–1.24)
GFR calc Af Amer: >60 mL/min (ref >60)
GFR calc non Af Amer: >60 mL/min (ref >60)
Glucose, Bld: 105 mg/dL — ABNORMAL HIGH (ref 70–99)
Potassium: 3.5 mmol/L (ref 3.5–5.1)
Sodium: 140 mmol/L (ref 135–145)

## 2019-09-28 NOTE — Plan of Care (Signed)
°  Problem: RH Balance Goal: LTG Patient will maintain dynamic standing with ADLs (OT) Description: LTG:  Patient will maintain dynamic standing balance with assist during activities of daily living (OT)  Flowsheets (Taken 09/28/2019 1559) LTG: Pt will maintain dynamic standing balance during ADLs with: Supervision/Verbal cueing   Problem: Sit to Stand Goal: LTG:  Patient will perform sit to stand in prep for activites of daily living with assistance level (OT) Description: LTG:  Patient will perform sit to stand in prep for activites of daily living with assistance level (OT) Flowsheets (Taken 09/28/2019 1559) LTG: PT will perform sit to stand in prep for activites of daily living with assistance level: Supervision/Verbal cueing   Problem: RH Bathing Goal: LTG Patient will bathe all body parts with assist levels (OT) Description: LTG: Patient will bathe all body parts with assist levels (OT) Flowsheets (Taken 09/28/2019 1559) LTG: Pt will perform bathing with assistance level/cueing: Supervision/Verbal cueing   Problem: RH Dressing Goal: LTG Patient will perform upper body dressing (OT) Description: LTG Patient will perform upper body dressing with assist, with/without cues (OT). Flowsheets (Taken 09/28/2019 1559) LTG: Pt will perform upper body dressing with assistance level of: Set up assist Goal: LTG Patient will perform lower body dressing w/assist (OT) Description: LTG: Patient will perform lower body dressing with assist, with/without cues in positioning using equipment (OT) Flowsheets (Taken 09/28/2019 1559) LTG: Pt will perform lower body dressing with assistance level of:  Supervision/Verbal cueing  Other (Comment) Note: Excluding compression socks   Problem: RH Toileting Goal: LTG Patient will perform toileting task (3/3 steps) with assistance level (OT) Description: LTG: Patient will perform toileting task (3/3 steps) with assistance level (OT)  Flowsheets (Taken 09/28/2019  1559) LTG: Pt will perform toileting task (3/3 steps) with assistance level: Supervision/Verbal cueing   Problem: RH Toilet Transfers Goal: LTG Patient will perform toilet transfers w/assist (OT) Description: LTG: Patient will perform toilet transfers with assist, with/without cues using equipment (OT) Flowsheets (Taken 09/28/2019 1559) LTG: Pt will perform toilet transfers with assistance level of: Supervision/Verbal cueing   Problem: RH Tub/Shower Transfers Goal: LTG Patient will perform tub/shower transfers w/assist (OT) Description: LTG: Patient will perform tub/shower transfers with assist, with/without cues using equipment (OT) Flowsheets (Taken 09/28/2019 1559) LTG: Pt will perform tub/shower stall transfers with assistance level of: Supervision/Verbal cueing

## 2019-09-28 NOTE — Progress Notes (Signed)
PHYSICAL MEDICINE & REHABILITATION PROGRESS NOTE   Subjective/Complaints: States he had reasonablenight. Didn't use cpap. Hasn't contacted wife about bringing mask from home  ROS: Patient denies fever, rash, sore throat, blurred vision, nausea, vomiting, diarrhea, cough, shortness of breath or chest pain, joint or back pain, headache, or mood change.    Objective:   No results found. No results for input(s): WBC, HGB, HCT, PLT in the last 72 hours. Recent Labs    09/28/19 0438  NA 140  K 3.5  CL 99  CO2 31  GLUCOSE 105*  BUN 16  CREATININE 0.76  CALCIUM 9.1    Intake/Output Summary (Last 24 hours) at 09/28/2019 0835 Last data filed at 09/28/2019 0207 Gross per 24 hour  Intake 300 ml  Output 2175 ml  Net -1875 ml     Physical Exam: Vital Signs Blood pressure 129/62, pulse 62, temperature 97.7 F (36.5 C), temperature source Oral, resp. rate 18, height 5\' 11"  (1.803 m), weight (!) 199.6 kg, SpO2 98 %. Constitutional: No distress . Vital signs reviewed. obese HEENT: EOMI, oral membranes moist Neck: supple Cardiovascular: RRR without murmur. No JVD    Respiratory/Chest: CTA Bilaterally without wheezes or rales. Normal effort    GI/Abdomen: BS +, non-tender, non-distended Ext: no clubbing, cyanosis, or edema Psych: pleasant but flat . Musculoskeletal:  General: Swelling(1+ edema bilateral hand and pedally)present.  Cervical back: Normal range of motion. Neurological: He is oriented to person, place, and time. He appearsfairly alert Nocranial nerve deficitor sensory deficit. UE: 3-/5 prox to 4-/5 distally. LE: 3- HF, KE and 3+ ADF/PF. No focal sensory deficits. Reasonable insight and awareness. No dysmetria, good depth perception--neuro exam stable Skin: Abrasion/pressure injury with eschar to bridge of nose--stable      Assessment/Plan: 1. Functional deficits secondary to IVH,cerebellar infarcts which require 3+ hours per day of  interdisciplinary therapy in a comprehensive inpatient rehab setting.  Physiatrist is providing close team supervision and 24 hour management of active medical problems listed below.  Physiatrist and rehab team continue to assess barriers to discharge/monitor patient progress toward functional and medical goals  Care Tool:  Bathing              Bathing assist       Upper Body Dressing/Undressing Upper body dressing   What is the patient wearing?: Hospital gown only    Upper body assist Assist Level: Minimal Assistance - Patient > 75%    Lower Body Dressing/Undressing Lower body dressing      What is the patient wearing?: Hospital gown only     Lower body assist       Toileting Toileting    Toileting assist Assist for toileting: Maximal Assistance - Patient 25 - 49% Assistive Device Comment: urinal   Transfers Chair/bed transfer  Transfers assist           Locomotion Ambulation   Ambulation assist              Walk 10 feet activity   Assist           Walk 50 feet activity   Assist           Walk 150 feet activity   Assist           Walk 10 feet on uneven surface  activity   Assist           Wheelchair     Assist  Wheelchair 50 feet with 2 turns activity    Assist            Wheelchair 150 feet activity     Assist          Blood pressure 129/62, pulse 62, temperature 97.7 F (36.5 C), temperature source Oral, resp. rate 18, height 5\' 11"  (1.803 m), weight (!) 199.6 kg, SpO2 98 %.  Medical Problem List and Plan: 1.Functional and mobility deficitssecondary to 4th ventricle hemorrhage and right PICA aneurysm s/p embolizationwith subsequent R>L cerebellar infarcts -patient may shower -ELOS/Goals: mod I to supervision/ 13-15days  -Patient is beginning CIR therapies today including PT and OT, SLP  2.Left posterior tib  DVT/Antithrombotics: -DVT/anticoagulation:Mechanical:Sequential compression devices, below kneeRight lower extremityand compression stockings.  -pt had IVCF placed on 6/9.  -antiplatelet therapy:ASA 3. Pain Management:Tylenol prn 4. Mood:LCSW to follow for evaluation and support. -antipsychotic agents:N/A 5. Neuropsych: This patientiscapable of making decisions on hisown behalf. 6. Skin/Wound Care:Routine pressure relief measures. 7. Fluids/Electrolytes/Nutrition: encourage appropriate po  I personally reviewed the patient's labs today.  WNL. 8. HTN: Monitor BP tid--continue catapres TTS-3 patch, Norvasc, Hydralazine, HCTZ, Avapro and aldactone. -controlled 6/12 9. 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 10. OSA: Continue CPAP whenever napping and at nights.Asked again for patient to have wife bring his home facemask 11.Hypoxia: Likely multifactorial- body habitus, intermittent napping, OSA. Encourage IS and wean oxygen to off.  12. Morbid Obesity: BMI-58. Heart Healthy diet.   -dietitian consulted for counseling 13. Bradycardia: Intermittent and asymptomatic. 60's today  14. Hyponatremia: Will recheck labs tomorrow as Na on downward trend. -most recently 131--->140 6/12 15. Leucocytosis/ABLA:13.5-->11.5 - cbc pending today  16. Impaired Fasting Glucose: Hgb A1c- 4.7.  17. Dyslipidemia: On Lipitor.    LOS: 1 days A FACE TO FACE EVALUATION WAS PERFORMED  Meredith Staggers 09/28/2019, 8:35 AM

## 2019-09-28 NOTE — Progress Notes (Signed)
Asked pt about cpap stated he didn't want it now, Rt instructed him to call if and when he was ready.

## 2019-09-28 NOTE — Evaluation (Signed)
Speech Language Pathology Assessment and Plan  Patient Details  Name: Andrew Perez MRN: [id number] Date of Birth: October 26, 1965  SLP Diagnosis: Cognitive Impairments;Speech and Language deficits  Rehab Potential:   ELOS: 13-16 days    Today's Date: 09/28/2019 SLP Individual Time: 0805-0900 SLP Individual Time Calculation (min): 55 min   Problem List:  Patient Active Problem List   Diagnosis Date Noted  . Cerebral aneurysm, R PICA s/p coiling 09/27/2019  . 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 Prolactinoma ([place]) 09/27/2019  . DVT of leg (deep venous thrombosis) ([place]) 09/27/2019  . Obstructive sleep apnea 09/27/2019  . Hypothyroidism 09/27/2019  . ICH (intracerebral hemorrhage) ([place]) 09/27/2019  . Intraventricular hemorrhage ([place]) 09/17/2019  . Hyperlipidemia 07/04/2016  . Hypokalemia 07/04/2016  . Leukocytosis 07/04/2016  . Morbid obesity ([place]) 06/29/2016  . 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 Hypertensive emergency 06/29/2016  . IVH (intraventricular hemorrhage) ([place]) 06/28/2016   Past Medical History:  Past Medical History:  Diagnosis Date  . Hypertension   . IVH (intraventricular hemorrhage) ([place]) 2018   due to HTN  . Prolactinoma ([place])    followed by [facility]  . Sleep apnea    Past Surgical History:  Past Surgical History:  Procedure Laterality Date  . IR 3D INDEPENDENT WKST  09/19/2019  . IR ANGIO INTRA EXTRACRAN SEL COM CAROTID INNOMINATE BILAT MOD SED  09/18/2019  . IR ANGIO VERTEBRAL SEL SUBCLAVIAN INNOMINATE UNI L MOD SED  09/18/2019  . IR ANGIO VERTEBRAL SEL VERTEBRAL UNI R MOD SED  09/18/2019  . IR ANGIO VERTEBRAL SEL VERTEBRAL UNI R MOD SED  09/19/2019  . IR ANGIOGRAM FOLLOW UP STUDY  09/19/2019  . IR CT HEAD LTD  09/19/2019  . IR IVC FILTER PLMT / S&I /IMG GUID/MOD SED  09/25/2019  . IR NEURO EACH ADD'L AFTER BASIC UNI RIGHT (MS)  09/19/2019  . IR TRANSCATH/EMBOLIZ  09/19/2019  . IR US GUIDE VASC ACCESS RIGHT  09/18/2019  . IR US GUIDE VASC ACCESS RIGHT   09/19/2019  . RADIOLOGY WITH ANESTHESIA N/A 09/19/2019   Procedure: RADIOLOGY WITH ANESTHESIA ANUERYSM COILING;  Surgeon: Pedro Earls, MD;  Location: [facility];  Service: Radiology;  Laterality: N/A;  . TONSILLECTOMY      Assessment / Plan / Recommendation Clinical Impression Andrew Perez a 54 y.o.malewith history of HTN, morbid obesity--BMI 37, IVH-'18, OSA, prolactinoma who was admitted on 09/17/19 with acute onset of HA with dizziness and episode of vomiting. 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 Bleed felt to be hypertensive in nature. He was started on cleviprex for BP management and Dr. Ronnald Ramp consulted for input. CT angio recommended for work up with SBP<140 and NS signed off as no hydrocephalus noted. 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 Katherina Right on 06/03. Post procedure with nausea and Repeat CT head showed right more than left cerebellar infarct and MRI confirmed evolving R>L PIC territory infarct --similar in extension c/w prior CT. 2D echo showed EF 55-60% and moderate LVH and moderately dilated left atrium. Now on ASA for secondary stroke prevention. BP remains labile requiring Cleviprex. CIR recommended due to balance deficits affecting functional status.Pt is to admit to CIR on6/11/21  Pt demonstrated mild-moderate cognitive linguistic impairments in higher level tasks, deficits include short term recall, mild-complex  problem solving, emergent awareness, thought organization and word finding. Pt participated in cognitive linguistic assessment Cognistat in which pt scored severe impairment in similarities (abstract language), mild impairment in confrontation naming, mild impairment in short term recall and WFL on attention, construction (completed 2 out 3), comprehension  (diffculity with 3 step directions), calculations (completed 3 out 4) and judgement. Pt demonstrated intermittent intellectual awareness and emergent awareness of functional and verbal errors with mod A verbal cues. Pt's language is characterized by limited verbal output at the phrase/sentence level, reduced organizational thought/structure, semantic and phonemic paraphasias and reduce awareness of errors. Pt denies baseline deficits and family was not present to confirm baseline abilities. Pt's speech appeared [facility] and pt expressed no concerns with swallow function. Pt would benefit from skills ST services in order to maximize functional independence and reduce burden of care, requiring supervision in higher level tasks and continued ST services.   Skilled Therapeutic Interventions          Skilled ST services focused on cognitive skills. SLP facilitated cognitive lingustic assessment, provided education on current deficits and plan for treatment. All questions answered to satisfaction. Pt was left in room with call bell within reach and bed alarm set. ST recommends to continue skilled ST services.   SLP Assessment  Patient will need skilled [place] [facility] Services during CIR admission    Recommendations  Patient destination: Home Follow up Recommendations: Home Health SLP;Outpatient SLP;24 hour supervision/assistance Equipment Recommended: None recommended by SLP    SLP Frequency 3 to 5 out of 7 days   SLP Duration  SLP Intensity  SLP Treatment/Interventions 13-16 days  Minumum of 1-2 x/day, 30 to 90 minutes  Cognitive remediation/compensation;Cueing hierarchy;Functional tasks;Internal/external aids;Patient/family education;Speech/Language facilitation    Pain Pain Assessment Pain Score: 0-No pain  Prior Functioning Cognitive/Linguistic Baseline: Information not available (family not present, pt denies baseline deficits) Type of Home: House  Lives With: Spouse;Son (son is 4  years old) Available Help at Discharge: Family Vocation: Full time employment  SLP Evaluation Cognition Overall Cognitive Status: Impaired/Different from baseline Arousal/Alertness: Awake/alert Orientation Level: Oriented X4 (expect day of week, off by one day) Attention: Sustained Sustained Attention: Appears intact Memory: Impaired Immediate Memory Recall: Sock;Blue;Bed Memory Recall Sock: Without Cue Memory Recall Blue: Without Cue Memory Recall Bed: With Cue Awareness: Impaired Awareness Impairment: Emergent impairment Problem Solving: Impaired Problem Solving Impairment: Functional basic;Verbal complex Safety/Judgment: Appears intact  Comprehension Auditory Comprehension Overall Auditory Comprehension: Impaired Yes/No Questions: Within Functional Limits Commands: Impaired One Step Basic Commands: 75-100% accurate Two Step Basic Commands: 75-100% accurate Multistep Basic Commands: 50-74% accurate Conversation: Simple Interfering Components: Processing speed;Working Field seismologist: Tour manager: Within [employer] Reading Comprehension Reading Status: Not tested Expression Expression Primary Mode of Expression: Verbal Verbal Expression Overall Verbal Expression: Impaired Initiation: No impairment Level of Generative/Spontaneous Verbalization: Sentence;Phrase Repetition: No impairment Naming: Impairment Confrontation: Impaired Divergent: 0-24% accurate Verbal Errors: Semantic paraphasias;Phonemic paraphasias;Not aware of errors;Aware of errors Pragmatics:  (a bit flat) Written Expression Dominant Hand: Right Written Expression: Not tested Oral Motor Oral Motor/Sensory Function Overall Oral Motor/Sensory Function: Within functional limits Motor Speech Overall Motor Speech: Appears within functional limits for tasks assessed  Short Term Goals: Week 1: SLP Short Term Goal 1 (Week 1): Pt  will recall novel and daily information with min A verbal cues for use of compensatory strategies. SLP Short Term Goal 2 (Week 1): Pt will complete semi-complex to complex problem solving tasks with min A verbal cues. SLP  Short Term Goal 3 (Week 1): Pt will self-recognize and self-correct functional and verbal errors with min A verbal cues. SLP Short Term Goal 4 (Week 1): Pt will complete mildly complex word finding tasks with min A verbal cues for word finding strategies. SLP Short Term Goal 5 (Week 1): Pt will demonstrate organized thought strutcure at the sentence level with min A verbal cues.  Refer to Care Plan for Long Term Goals  Recommendations for other services: None   Discharge Criteria: Patient will be discharged from SLP if patient refuses treatment 3 consecutive times without medical reason, if treatment goals not met, if there is a change in medical status, if patient makes no progress towards goals or if patient is discharged from hospital.  The above assessment, treatment plan, treatment alternatives and goals were discussed and mutually agreed upon: by patient  Nazifa Trinka  [facility] 09/28/2019, 12:46 PM

## 2019-09-28 NOTE — Evaluation (Signed)
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 09/27/2019   Prolactinoma ([place]) 09/27/2019   DVT of leg (deep venous thrombosis) ([place]) 09/27/2019   Obstructive sleep apnea 09/27/2019   Hypothyroidism 09/27/2019   ICH (intracerebral hemorrhage) ([place]) 09/27/2019   Intraventricular hemorrhage ([place]) 09/17/2019   Hyperlipidemia 07/04/2016   Hypokalemia 07/04/2016   Leukocytosis 07/04/2016   Morbid obesity ([place]) 06/29/2016   Pituitary tumor 06/29/2016   Hypertensive emergency 06/29/2016   IVH (intraventricular hemorrhage) ([place]) 06/28/2016    Past Medical History:  Past Medical History:  Diagnosis Date   Hypertension    IVH (intraventricular hemorrhage) ([place]) 2018   due to HTN   Prolactinoma ([place])    followed by [facility]   Sleep apnea    Past Surgical History:  Past Surgical History:  Procedure Laterality Date   IR 3D INDEPENDENT WKST  09/19/2019   IR ANGIO INTRA EXTRACRAN SEL COM CAROTID INNOMINATE BILAT MOD SED  09/18/2019   IR ANGIO VERTEBRAL SEL SUBCLAVIAN INNOMINATE UNI L MOD SED  09/18/2019   IR ANGIO VERTEBRAL SEL VERTEBRAL UNI R MOD SED  09/18/2019   IR ANGIO VERTEBRAL SEL VERTEBRAL UNI R MOD SED  09/19/2019   IR ANGIOGRAM FOLLOW UP STUDY  09/19/2019   IR CT HEAD LTD  09/19/2019   IR IVC FILTER PLMT / S&I /IMG GUID/MOD SED  09/25/2019   IR NEURO EACH ADD'L AFTER BASIC UNI RIGHT (MS)  09/19/2019   IR TRANSCATH/EMBOLIZ  09/19/2019   IR US GUIDE  VASC ACCESS RIGHT  09/18/2019   IR US GUIDE VASC ACCESS RIGHT  09/19/2019   RADIOLOGY WITH ANESTHESIA N/A 09/19/2019   Procedure: RADIOLOGY WITH ANESTHESIA ANUERYSM COILING;  Surgeon: Pedro Earls, MD;  Location: [facility];  Service: Radiology;  Laterality: N/A;   TONSILLECTOMY      Assessment & Plan Clinical Impression:  Andrew Perez is a 54 year old male with history of morbid obesity- BMI 58, IVH 2018, OSA, prolactinoma (followed at [facility]) who was admitted on 06/01/21who was admitted on 09/17/2019 with acute onset of HA with dizziness and episode of vomiting. He was noted to be hypertensive at admission and found to have small acute IVH involving fourth ventricle. Bleed was felt to be hypertensive in nature and he was started on Cleviprex for BP management. Dr. Ronnald Ramp was consulted for input and recommended CT angio for work-up as well as SBP less than 140. And has signed off and is no hydrocephalus noted. CTA showed distal 5 mm dissectingright-PICA aneurysm and he underwent embolizationof aneurysm with sacrifice of right PICA at telovelotonsillar segmentby Dr. Christophe Louis on 06/03. Postprocedure had nausea and repeat CT head showed R>Lcerebellar infarct. MRI brain confirmed evolving infarct similar in extension compared with prior CT. 2D echo showed EF 55 to 60%,moderate LVH and moderately dilated left atrium. Patient placed on aspirin for secondary stroke prevention.   His blood pressure continued to be labile requiring Cleviprex.BP meds added and titrated upwards and Cleviprex weaned off yesterday. He  has had fluctuating mental status and repeat CT head 6/8 showed evolving nonhemorrhagic cerebellar infarcts right greater than left, progressive mass-effect with effacement of sulci posterior fossa/supratentorial structures, and slight increase in size of frontal horn of lateral ventricles and third ventricle. BLE Dopplers donedue to LE edema06/08 revealing DVT in left  posterior tibial vein andIVC filter placed by radiology on 06/09 as not felt to be a candidate for anticoagulation. His lethargy is resolving but he continues to be limited by left-sided weakness with unsteady gait. CIR was recommended due to functional decline  Patient currently requires Min-Mod with basic self-care skills secondary to muscle weakness, decreased cardiorespiratoy endurance, decreased attention, decreased problem solving and decreased memory and decreased standing balance.  Prior to hospitalization, patient could complete BADLs with independent .  Patient will benefit from skilled intervention to increase independence with basic self-care skills prior to discharge home with spouse.  Anticipate patient will require 24 hour supervision and follow up home health.  OT Assessment Rehab Potential (ACUTE ONLY): Excellent OT Barriers to Discharge: Weight;Medical stability OT Patient demonstrates impairments in the following area(s): Balance;Safety;Cognition;Edema;Motor;Skin Integrity OT Basic ADL's Functional Problem(s): Grooming;Bathing;Dressing;Toileting OT Advanced ADL's Functional Problem(s): Light Housekeeping OT Transfers Functional Problem(s): Toilet;Tub/Shower OT Additional Impairment(s): None OT Plan OT Intensity: Minimum of 1-2 x/day, 45 to 90 minutes OT Frequency: 5 out of 7 days OT Duration/Estimated Length of Stay: 7-10 days OT Treatment/Interventions: Balance/vestibular training;Discharge planning;Pain management;Self Care/advanced ADL retraining;Therapeutic Activities;UE/LE Coordination activities;Therapeutic Exercise;Patient/family education;Functional mobility training;Disease mangement/prevention;Cognitive remediation/compensation;Community reintegration;DME/adaptive equipment instruction;Neuromuscular re-education;Psychosocial support;UE/LE Strength taining/ROM OT Self Feeding Anticipated Outcome(s): No goal OT Basic Self-Care Anticipated Outcome(s):  Supervision/cuing OT Toileting Anticipated Outcome(s): Supervision/cuing OT Bathroom Transfers Anticipated Outcome(s): Supervision/cuing OT Recommendation Patient destination: Home Follow Up Recommendations: Home health OT Equipment Recommended: To be determined   Skilled Therapeutic Intervention Skilled OT session completed with focus on initial evaluation, education on OT role/POC, and establishment of patient-centered goals.   Pt greeted in bed with no c/o pain. Per RN, pt taken off of supplemental 02 earlier. Sats were 95-100% throughout activity today on RA, assessed frequently, post stands and also post short distance ambulation. Started session with bed mobility, pt able to transition to EOB with CGA using the bedrail. He then engaged in bathing/dressing tasks sit<stand using bariatric RW for standing support. CGA for dynamic sitting and CGA-Min A for dynamic standing tasks. He did need Max A to wash feet and don footwear, spouse assisted OT with donning Teds. Stand pivot<BSC using RW completed with CGA. Pt doffed LB clothing though was educated that this was a simulated transfer because he didn't have to void. Afterwards pt needed Min A for elevating clothing before ambulating with RW approx 5 ft to the recliner near sink. He completed oral care/grooming tasks with supervision/cuing while seated. Spouse also washed his hair which appeared to brighten affect. Pt remained sitting in the recliner with all needs within reach and chair alarm set, wife Seth Bake still present.   OT Evaluation Precautions/Restrictions  Precautions Precautions: Fall Restrictions Weight Bearing Restrictions: No Pain Pain Assessment Pain Score: 0-No pain Home Living/Prior Functioning Home Living Available Help at Discharge: Family Type of Home: House Entrance Stairs-Number of Steps: 4-5 Home Layout: One level Bathroom Shower/Tub: Gaffer (+seat) Bathroom Toilet: Handicapped height Bathroom  Accessibility: Yes  Lives With: Spouse, Son (son is 99 years old) IADL History Type of Occupation: Theme park manager Leisure and Hobbies: reading Prior Function Level of Independence: Independent with basic ADLs, Independent with transfers Driving: Yes Vocation: Full time  employment ADL ADL Eating: Not assessed Grooming: Supervision/safety Where Assessed-Grooming: Sitting at sink Upper Body Bathing: Supervision/safety Where Assessed-Upper Body Bathing: Edge of bed Lower Body Bathing: Minimal assistance Where Assessed-Lower Body Bathing: Edge of bed Upper Body Dressing: Supervision/safety Where Assessed-Upper Body Dressing: Edge of bed Lower Body Dressing: Moderate assistance Where Assessed-Lower Body Dressing: Edge of bed Toileting: Not assessed Toilet Transfer: Contact guard Toilet Transfer Method: Stand pivot (with RW) Toilet Transfer Equipment: Bedside commode Tub/Shower Transfer: Not assessed Vision Baseline Vision/History: Wears glasses Wears Glasses: At all times Patient Visual Report: No change from baseline Perception  Perception: Within Functional Limits Praxis Praxis: Intact Cognition Overall Cognitive Status: Impaired/Different from baseline Arousal/Alertness: Awake/alert Orientation Level: Person;Place;Situation Person: Oriented Place: Oriented Situation: Oriented Year: 2021 Month: June Day of Week: Correct Memory: Impaired Immediate Memory Recall: Sock;Blue;Bed Memory Recall Sock: Without Cue Memory Recall Blue: Without Cue Memory Recall Bed: With Cue Attention: Sustained Sustained Attention: Appears intact Awareness: Impaired Awareness Impairment: Emergent impairment Problem Solving: Impaired Problem Solving Impairment: Functional basic;Verbal complex Safety/Judgment: Appears intact Sensation Coordination Gross Motor Movements are Fluid and Coordinated: No Fine Motor Movements are Fluid and Coordinated: Yes Coordination and Movement Description: Gross  motor mildly affected by body habitus during self care tasks Finger Nose Finger Test: WNL bilaterally Motor  Motor Motor: Other (comment) Motor - Skilled Clinical Observations: generalized weakness Mobility    CGA stand pivot BSC transfer using RW Trunk/Postural Assessment  Cervical Assessment Cervical Assessment: Exceptions to [facility] (forward head) Thoracic Assessment Thoracic Assessment: Exceptions to [facility] (rounded shoulders) Lumbar Assessment Lumbar Assessment: Exceptions to [facility] (posterior pelvic tilt) Postural Control Postural Control: Within Functional Limits  Balance Balance Balance Assessed: Yes Dynamic Sitting Balance Dynamic Sitting - Level of Assistance: 4: Min assist (washing lower legs while sitting EOB) Dynamic Standing Balance Dynamic Standing - Level of Assistance: 4: Min assist (LB dressing without UE support on the walker) Extremity/Trunk Assessment RUE Assessment RUE Assessment: Within Functional Limits Active Range of Motion (AROM) Comments: WNL General Strength Comments: 4-/5 grossly LUE Assessment LUE Assessment: Within Functional Limits Active Range of Motion (AROM) Comments: WNL General Strength Comments: 4/5 grossly    Refer to Care Plan for Long Term Goals  Recommendations for other services: None    Discharge Criteria: Patient will be discharged from OT if patient refuses treatment 3 consecutive times without medical reason, if treatment goals not met, if there is a change in medical status, if patient makes no progress towards goals or if patient is discharged from hospital.  The above assessment, treatment plan, treatment alternatives and goals were discussed and mutually agreed upon: by patient  Skeet Simmer 09/28/2019, 12:39 PM

## 2019-09-28 NOTE — Evaluation (Signed)
Physical Therapy Assessment and Plan  Patient Details  Name: Andrew Perez MRN: [id number] Date of Birth: 1965-12-27  PT Diagnosis: Abnormal posture, Abnormality of gait, Cognitive deficits, Coordination disorder, Difficulty walking, Edema, Hemiparesis non-dominant, Impaired cognition and Muscle weakness Rehab Potential: Good ELOS: 7-10 days   Today's Date: 09/28/2019 PT Individual Time: 1310-1410 PT Individual Time Calculation (min): 60 min    Problem List:  Patient Active Problem List   Diagnosis Date Noted  . Cerebral aneurysm, R PICA s/p coiling 09/27/2019  . 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 Prolactinoma ([place]) 09/27/2019  . DVT of leg (deep venous thrombosis) ([place]) 09/27/2019  . Obstructive sleep apnea 09/27/2019  . Hypothyroidism 09/27/2019  . ICH (intracerebral hemorrhage) ([place]) 09/27/2019  . Intraventricular hemorrhage ([place]) 09/17/2019  . Hyperlipidemia 07/04/2016  . Hypokalemia 07/04/2016  . Leukocytosis 07/04/2016  . Morbid obesity ([place]) 06/29/2016  . 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 Hypertensive emergency 06/29/2016  . IVH (intraventricular hemorrhage) ([place]) 06/28/2016    Past Medical History:  Past Medical History:  Diagnosis Date  . Hypertension   . IVH (intraventricular hemorrhage) ([place]) 2018   due to HTN  . Prolactinoma ([place])    followed by [facility]  . Sleep apnea    Past Surgical History:  Past Surgical History:  Procedure Laterality Date  . IR 3D INDEPENDENT WKST  09/19/2019  . IR ANGIO INTRA EXTRACRAN SEL COM CAROTID INNOMINATE BILAT MOD SED  09/18/2019  . IR ANGIO VERTEBRAL SEL SUBCLAVIAN INNOMINATE UNI L MOD SED  09/18/2019  . IR ANGIO VERTEBRAL SEL VERTEBRAL UNI R MOD SED  09/18/2019  . IR ANGIO VERTEBRAL SEL VERTEBRAL UNI R MOD SED  09/19/2019  . IR ANGIOGRAM FOLLOW UP STUDY  09/19/2019  . IR CT HEAD LTD  09/19/2019  . IR IVC FILTER PLMT / S&I /IMG GUID/MOD SED  09/25/2019  . IR NEURO EACH ADD'L AFTER BASIC UNI RIGHT (MS)  09/19/2019  . IR  TRANSCATH/EMBOLIZ  09/19/2019  . IR US GUIDE VASC ACCESS RIGHT  09/18/2019  . IR US GUIDE VASC ACCESS RIGHT  09/19/2019  . RADIOLOGY WITH ANESTHESIA N/A 09/19/2019   Procedure: RADIOLOGY WITH ANESTHESIA ANUERYSM COILING;  Surgeon: Pedro Earls, MD;  Location: [place];  Service: Radiology;  Laterality: N/A;  . TONSILLECTOMY      Assessment & Plan Clinical Impression: Patient is a 54 y.o. year old male with history of morbid obesity- BMI 58, IVH 2018, OSA, prolactinoma (followed at [facility]) who was admitted on 06/01/21who was admitted on 09/17/2019 with acute onset of HA with dizziness and episode of vomiting. He was noted to be hypertensive at admission and found to have small acute IVH involving fourth ventricle. Bleed was felt to be hypertensive in nature and he was started on Cleviprex for BP management. Dr. Ronnald Ramp was consulted for input and recommended CT angio for work-up as well as SBP less than 140. And has signed off and is no hydrocephalus noted. CTA showed distal 5 mm dissectingright-PICA aneurysm and he underwent embolizationof aneurysm with sacrifice of right PICA at telovelotonsillar segmentby Dr. Christophe Louis on 06/03. Postprocedure had nausea and repeat CT head showed R>Lcerebellar infarct. MRI brain confirmed evolving infarct similar in extension compared with prior CT. 2D echo showed EF 55 to 60%,moderate LVH and moderately dilated left atrium. Patient placed on aspirin for secondary stroke prevention.   His blood pressure continued to be labile requiring Cleviprex.BP meds added and titrated upwards and Cleviprex weaned off yesterday.  He has had fluctuating mental status and repeat CT head 6/8 showed evolving nonhemorrhagic cerebellar infarcts right greater than left, progressive mass-effect with effacement of sulci posterior fossa/supratentorial structures, and slight increase in size of frontal horn of lateral ventricles and third ventricle. BLE Dopplers donedue to  LE edema06/08 revealing DVT in left posterior tibial vein andIVC filter placed by radiology on 06/09 as not felt to be a candidate for anticoagulation. His lethargy is resolving but he continues to be limited by left-sided weakness with unsteady gait. CIR was recommended due to functional decline. Patient transferred to CIR on 09/27/2019 .   Patient currently requires min with mobility secondary to muscle weakness, decreased cardiorespiratoy endurance, impaired timing and sequencing and decreased coordination, decreased motor planning, decreased initiation, decreased attention, decreased awareness, decreased problem solving, decreased safety awareness, decreased memory and delayed processing and decreased sitting balance, decreased standing balance, decreased postural control and decreased balance strategies.  Prior to hospitalization, patient was independent  with mobility and lived with Spouse, Son in a House home.  Home access is 3 tall brick steps to enter from garage (main entrance) .  Patient will benefit from skilled PT intervention to maximize safe functional mobility, minimize fall risk and decrease caregiver burden for planned discharge home with 24 hour supervision.  Anticipate patient will benefit from follow up [place] at discharge.  PT - End of Session Activity Tolerance: Tolerates 30+ min activity with multiple rests Endurance Deficit: Yes Endurance Deficit Description: requires seated rest breaks PT Assessment Rehab Potential (ACUTE/IP ONLY): Good PT Barriers to Discharge: Inaccessible home environment;Home environment access/layout;Weight PT Patient demonstrates impairments in the following area(s): Balance;Perception;Behavior;Safety;Edema;Sensory;Endurance;Skin Integrity;Motor;Nutrition;Pain PT Transfers Functional Problem(s): Bed Mobility;Bed to Chair;Car;Furniture PT Locomotion Functional Problem(s): Ambulation;Stairs PT Plan PT Intensity: Minimum of 1-2 x/day ,45 to 90  minutes PT Frequency: 5 out of 7 days PT Duration Estimated Length of Stay: 7-10 days PT Treatment/Interventions: Ambulation/gait training;Community reintegration;DME/adaptive equipment instruction;Neuromuscular re-education;Psychosocial support;Stair training;UE/LE Strength taining/ROM;Wheelchair propulsion/positioning;Discharge planning;Balance/vestibular training;Functional electrical stimulation;Pain management;Skin care/wound management;Therapeutic Activities;UE/LE Coordination activities;Cognitive remediation/compensation;Disease management/prevention;Functional mobility training;Patient/family education;Splinting/orthotics;Therapeutic Exercise;Visual/perceptual remediation/compensation PT Transfers Anticipated Outcome(s): supervision PT Locomotion Anticipated Outcome(s): supervision PT Recommendation Follow Up Recommendations: Home health PT;24 hour supervision/assistance Patient destination: Home Equipment Recommended: To be determined;Other (comment) Equipment Details: pt's wife reports he has a RW  Skilled Therapeutic Intervention Pt received sitting in recliner and agreeable to therapy session. Evaluation completed (see details above and below) with education on PT POC and goals and individual treatment initiated with focus on bed mobility, functional transfers, gait training, stair navigation, activity tolerance, and pt/family education regarding daily therapy schedule, weekly team meetings, purpose of PT evaluation, and other CIR information. Therapist retrieved w/c with cushion for improved upright, OOB activity tolerance and pressure relief. Sit>stand recliner>no AD with min assist for balance. Stand pivot recliner>EOB, no AD, with min assist and pt having poor eccentric control flopping backwards onto the bed when sitting. Sit>supine with min assist for B LE management without bed features. Supine>sit with mod assist for trunk upright. Stand pivot to w/c, no AD, with min assist for  balance.  Transported to/from gym in w/c for time management and energy conservation. Gait training ~52f using RW with min assist/CGA for steadying - demonstrates wide BOS with decreased gait speed - unsafe to attempt ambulation without AD. SpO2 95% on RA and HR 67bpm Ascended/descended 4 steps using R HR (despite therapist educating pt that his wife reports garage stairs having L HR pt adamant about using R HR) - performed with  min assist for lifting/lowering via side-stepping pattern. SpO2 97% on RA and HR 69bpm Performed simulated ambulatory ~41f car transfer (SUV height) without AD and with min assist for balance - despite therapist cuing pt to turn and sit prior to placing feet in vehicle pt attempts to lift L LE and place in the car therefore requiring increased cuing and manual facilitation to turn and sit - pt with poor safety awareness. Transported back to room in w/c. Upon questioning on whether pt wants to sit in recliner vs lie in bed pt demonstrates some confusion stating "I don't know" and that he hasn't "tried it." Wife encouraging pt to rest due to confusion being sign of fatigue. Stand pivot w/c>EOB using RW with min assist for balance. Sit>supine with min assist for B LE management. Pt left supine in bed with needs in reach, bed alarm on, and pt's wife present.   PT Evaluation Precautions/Restrictions Precautions Precautions: Fall Precaution Comments: L sided weekness, obese, bilat LE edema-wears compression stocking Restrictions Weight Bearing Restrictions: No Pain Pain Assessment Pain Scale: Faces Faces Pain Scale: Hurts a little bit Pain Type: Acute pain Pain Location: Buttocks Pain Orientation: Medial;Mid Pain Descriptors / Indicators: Aching Pain Onset: Other (Comment) (due to prolonged sitting) Pain Intervention(s): Repositioned Home Living/Prior Functioning Home Living Available Help at Discharge: Family;Available 24 hours/day Type of Home: House Home Access: Stairs  to enter [employer] Steps: 3 tall brick steps to enter from garage (main entrance) Entrance Stairs-Rails: Left Home Layout: One level Bathroom Shower/Tub: WGaffer(+seat) Bathroom Toilet: Handicapped height Bathroom Accessibility: Yes  Lives With: Spouse;Son (Andrea(wife)) Prior Function Level of Independence: Independent with transfers;Independent with gait  Able to Take Stairs?: Yes Driving: Yes Vocation: Full time employment Comments: 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 Perception  Perception Perception: Within [place] Impaired Praxis Impairment Details: Motor planning  Cognition Overall Cognitive Status: Impaired/Different from baseline Arousal/Alertness: Awake/alert Orientation Level: Oriented X4 Attention: Focused;Sustained Focused Attention: Appears intact Sustained Attention: Appears intact Memory: Impaired Awareness: Impaired Problem Solving: Impaired Safety/Judgment: Impaired (pt attempting unsafe transfers during session and appears unaware of his deficits)  Sensation Sensation Light Touch: Appears Intact Hot/Cold: Not tested Proprioception: Appears Intact Stereognosis: Not tested Coordination Gross Motor Movements are Fluid and Coordinated: No Coordination and Movement Description: Gross motor mildly affected by body habitus during functional mobility Motor  Motor Motor: Other (comment) Motor - Skilled Clinical Observations: generalized weakness  Mobility Bed Mobility Bed Mobility: Supine to Sit;Sit to Supine Supine to Sit: Moderate Assistance - Patient 50-74% Sit to Supine: Minimal Assistance - Patient > 75% Transfers Transfers: Sit to Stand;Stand to Sit;Stand Pivot Transfers Sit to Stand: Minimal Assistance - Patient > 75% Stand to Sit: Minimal Assistance - Patient > 75% Stand Pivot Transfers: Minimal Assistance - Patient > 75% Stand Pivot Transfer Details: Tactile cues for  weight shifting;Tactile cues for sequencing;Verbal cues for sequencing;Verbal cues for technique;Verbal cues for precautions/safety Transfer (Assistive device): None Locomotion  Gait Ambulation: Yes Gait Assistance: Minimal Assistance - Patient > 75% Gait Distance (Feet): 56 Feet Assistive device: Rolling walker Gait Assistance Details: Verbal cues for technique;Verbal cues for safe use of DME/AE Gait Gait: Yes Gait Pattern: Impaired Gait Pattern: Wide base of support Gait velocity: decreased Stairs / Additional Locomotion Stairs: Yes Stairs Assistance: Minimal Assistance - Patient > 75% Stair Management Technique: One rail Right Number of Stairs: 4 Height of Stairs: 6 Wheelchair Mobility Wheelchair Mobility: No  Trunk/Postural Assessment  Cervical  Assessment Cervical Assessment: Exceptions to [facility] (forward head) Thoracic Assessment Thoracic Assessment: Exceptions to [facility] (rounded/protracted shoulders) Lumbar Assessment Lumbar Assessment: Exceptions to [facility] (posteroir pelvic tilt in sitting) Postural Control Postural Control: Deficits on evaluation Postural Limitations: decreased with pt using B UE support on RW for balance  Balance Balance Balance Assessed: Yes Dynamic Sitting Balance Dynamic Sitting - Level of Assistance: 4: Min assist Static Standing Balance Static Standing - Balance Support: During functional activity;Bilateral upper extremity supported Static Standing - Level of Assistance: Other (comment) (CGA) Dynamic Standing Balance Dynamic Standing - Balance Support: During functional activity;Bilateral upper extremity supported Dynamic Standing - Level of Assistance: 4: Min assist Extremity Assessment      RLE Assessment RLE Assessment: Exceptions to [facility] General Strength Comments: demonstrates grossly 4/5 to 4+/5 during functional mobility LLE Assessment LLE Assessment: Exceptions to [facility] General Strength Comments: Demonstrates grossly 4/5 to 4+/5 during  functional mobility    Refer to Care Plan for Long Term Goals  Recommendations for other services: None   Discharge Criteria: Patient will be discharged from PT if patient refuses treatment 3 consecutive times without medical reason, if treatment goals not met, if there is a change in medical status, if patient makes no progress towards goals or if patient is discharged from hospital.  The above assessment, treatment plan, treatment alternatives and goals were discussed and mutually agreed upon: by patient and by family  Tawana Scale, PT, DPT 09/28/2019, 12:23 PM

## 2019-09-29 ENCOUNTER — Inpatient Hospital Stay (HOSPITAL_COMMUNITY): Payer: 59 | Admitting: Speech Pathology

## 2019-09-29 NOTE — Progress Notes (Signed)
[place] PHYSICAL MEDICINE & REHABILITATION PROGRESS NOTE   Subjective/Complaints: Had a good night.  Wore CPAP. No new complaints  ROS: Patient denies fever, rash, sore throat, blurred vision, nausea, vomiting, diarrhea, cough, shortness of breath or chest pain, joint or back pain, headache, or mood change.    Objective:   No results found. No results for input(s): WBC, HGB, HCT, PLT in the last 72 hours. Recent Labs    09/28/19 0438  NA 140  K 3.5  CL 99  CO2 31  GLUCOSE 105*  BUN 16  CREATININE 0.76  CALCIUM 9.1    Intake/Output Summary (Last 24 hours) at 09/29/2019 0805 Last data filed at 09/29/2019 0535 Gross per 24 hour  Intake 560 ml  Output 2350 ml  Net -1790 ml     Physical Exam: Vital Signs Blood pressure (!) 143/70, pulse 70, temperature 98.2 F (36.8 C), temperature source Axillary, resp. rate 16, height 5\' 11"  (1.803 m), weight (!) 199.6 kg, SpO2 96 %. Constitutional: No distress . Vital signs reviewed. obese HEENT: EOMI, oral membranes moist Neck: supple Cardiovascular: RRR without murmur. No JVD    Respiratory/Chest: CTA Bilaterally without wheezes or rales. Normal effort GI/Abdomen: BS +, non-tender, non-distended Ext: no clubbing, cyanosis, or edema Psych: flat Musculoskeletal:  General: 1+ LE edema Cervical back: Normal range of motion. Neurological: He is oriented to person, place, and time. He appearsfairly alert Nocranial nerve deficitor sensory deficit. UE: 3-/5 prox to 4-/5 distally. LE: 3- HF, KE and 3+ ADF/PF. No focal sensory deficits. Reasonable insight and awareness. No dysmetria, good depth perception--neuro exam stable Skin: Abrasion/pressure injury with eschar to bridge of nose--stable      Assessment/Plan: 1. 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  Physiatrist is providing close team  supervision and 24 hour management of active medical problems listed below.  Physiatrist and rehab team continue to assess barriers to discharge/monitor patient progress toward functional and medical goals  Care Tool:  Bathing    Body parts bathed by patient: Right arm, Left arm, Chest, Abdomen, Front perineal area, Buttocks, Right upper leg, Left upper leg, Face   Body parts bathed by helper: Right lower leg, Left lower leg     Bathing assist Assist Level: Minimal Assistance - Patient > 75%     Upper Body Dressing/Undressing Upper body dressing   What is the patient wearing?: Pull over shirt    Upper body assist Assist Level: Supervision/Verbal cueing    Lower Body Dressing/Undressing Lower body dressing      What is the patient wearing?: Incontinence brief     Lower body assist Assist for lower body dressing: Minimal Assistance - Patient > 75%     Toileting Toileting    Toileting assist Assist for toileting: Maximal Assistance - Patient 25 - 49% Assistive Device Comment: urinal   Transfers Chair/bed transfer  Transfers assist     Chair/bed transfer assist level: Minimal Assistance - Patient > 75%     Locomotion Ambulation   Ambulation assist   Ambulation activity did not occur: Safety/medical concerns (requires use of RW - did not use AD baseline)          Walk 10 feet activity   Assist  Walk 10 feet activity did not occur: Safety/medical concerns        Walk 50 feet activity   Assist Walk 50 feet with 2 turns activity did not occur:  Safety/medical concerns         Walk 150 feet activity   Assist Walk 150 feet activity did not occur: Safety/medical concerns         Walk 10 feet on uneven surface  activity   Assist Walk 10 feet on uneven surfaces activity did not occur: Safety/medical concerns         Wheelchair     Assist Will patient use wheelchair at discharge?: No (TBD)             Wheelchair 50 feet  with 2 turns activity    Assist            Wheelchair 150 feet activity     Assist          Blood pressure (!) 143/70, pulse 70, temperature 98.2 F (36.8 C), temperature source Axillary, resp. rate 16, height 5\' 11"  (1.803 m), weight (!) 199.6 kg, SpO2 96 %.  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  -pt had IVCF placed on 6/9.  -antiplatelet therapy:ASA 3. Pain Management:Tylenol prn 4. Mood:LCSW to follow for evaluation and support. -antipsychotic agents:N/A 5. Neuropsych: This patientiscapable of making decisions on hisown behalf. 6. Skin/Wound Care:Routine pressure relief measures. 7. Fluids/Electrolytes/Nutrition: encourage appropriate po  I personally reviewed the patient's labs today.  WNL. 8. HTN: Monitor BP tid--continue catapres TTS-3 patch, Norvasc, Hydralazine, HCTZ, Avapro and aldactone. -controlled 6/13 9. 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 10. OSA: Continue CPAP whenever napping and at nights.  -wife to bring home face mask? 11.Hypoxia: Likely multifactorial- body habitus, intermittent napping, OSA. Encourage IS and wean oxygen to off.  12. Morbid Obesity: BMI-58. Heart Healthy diet.   -dietitian consulted for counseling 13. Bradycardia: Intermittent and asymptomatic. 60's today  14. Hyponatremia: resolved -most recently 131--->140 6/12 15. Leucocytosis/ABLA:13.5-->11.5 - check cbc Monday  16. Impaired Fasting  Glucose: Hgb A1c- 4.7.  17. Dyslipidemia: On Lipitor.    LOS: 2 days A FACE TO FACE EVALUATION WAS PERFORMED  Meredith Staggers 09/29/2019, 8:05 AM

## 2019-09-29 NOTE — Progress Notes (Signed)
Wife opened up to RN re: patient's change of behavior after stroke claims that he had disinterest in talking to people; paranoid at times ; some feeling of rejection. Requested pastoral consult. We let team aware and report to incoming RN.

## 2019-09-29 NOTE — Progress Notes (Signed)
Speech Language Pathology Daily Session Note  Patient Details  Name: Andrew Perez MRN: [id number] Date of Birth: Dec 23, 1965  Today's Date: 09/29/2019 SLP Individual Time: 8325-4982 SLP Individual Time Calculation (min): 38 min  Short Term Goals: Week 1: SLP Short Term Goal 1 (Week 1): Pt will recall novel and daily information with min A verbal cues for use of compensatory strategies. SLP Short Term Goal 2 (Week 1): Pt will complete semi-complex to complex problem solving tasks with min A verbal cues. SLP Short Term Goal 3 (Week 1): Pt will self-recognize and self-correct functional and verbal errors with min A verbal cues. SLP Short Term Goal 4 (Week 1): Pt will complete mildly complex word finding tasks with min A verbal cues for word finding strategies. SLP Short Term Goal 5 (Week 1): Pt will demonstrate organized thought strutcure at the sentence level with min A verbal cues.  Skilled Therapeutic Interventions:  Pt was seen for skilled ST targeting cognitive goals.  SLP facilitated the session with money management and functional daily math tasks to address goals for problem solving error awareness.  Pt needed min assist verbal cues to recognize and correct errors as well as to plan and execute a problem solving strategy for completing mental math calculations due to working memory deficits.  Pt benefited from using a pen and paper for more complex addition and subtraction problems due to working memory.  SLP also facilitated the session with medication management tasks to address the abovementioned goals.  Pt organized pills into a medication chart with supervision verbal cues for functional problem solving.  I would suggest that pt review his personal medications and practice using a pill box prior to discharge.  Pt was left in bed with all needs within reach.  Continue per current plan of care.    Pain Pain Assessment Pain Scale: 0-10 Pain Score: 0-No pain  Therapy/Group: Individual  Therapy  Demaris Bousquet, Selinda Orion 09/29/2019, 9:47 AM

## 2019-09-30 ENCOUNTER — Inpatient Hospital Stay (HOSPITAL_COMMUNITY): Payer: 59 | Admitting: Physical Therapy

## 2019-09-30 ENCOUNTER — Inpatient Hospital Stay (HOSPITAL_COMMUNITY): Payer: 59 | Admitting: Speech Pathology

## 2019-09-30 ENCOUNTER — Inpatient Hospital Stay (HOSPITAL_COMMUNITY): Payer: 59 | Admitting: Occupational Therapy

## 2019-09-30 LAB — COMPREHENSIVE METABOLIC PANEL
ALT: 17 U/L (ref 0–44)
AST: 26 U/L (ref 15–41)
Albumin: 3.3 g/dL — ABNORMAL LOW (ref 3.5–5.0)
Alkaline Phosphatase: 52 U/L (ref 38–126)
Anion gap: 9 (ref 5–15)
BUN: 16 mg/dL (ref 6–20)
CO2: 29 mmol/L (ref 22–32)
Calcium: 9.5 mg/dL (ref 8.9–10.3)
Chloride: 100 mmol/L (ref 98–111)
Creatinine, Ser: 1.03 mg/dL (ref 0.61–1.24)
GFR calc Af Amer: >60 mL/min (ref >60)
GFR calc non Af Amer: >60 mL/min (ref >60)
Glucose, Bld: 130 mg/dL — ABNORMAL HIGH (ref 70–99)
Potassium: 4.1 mmol/L (ref 3.5–5.1)
Sodium: 138 mmol/L (ref 135–145)
Total Bilirubin: 1.2 mg/dL (ref 0.3–1.2)
Total Protein: 7.1 g/dL (ref 6.5–8.1)

## 2019-09-30 LAB — CBC WITH DIFFERENTIAL/PLATELET
Abs Immature Granulocytes: 0.14 10*3/uL — ABNORMAL HIGH (ref 0.00–0.07)
Basophils Absolute: 0.1 10*3/uL (ref 0.0–0.1)
Basophils Relative: 1 %
Eosinophils Absolute: 0.3 10*3/uL (ref 0.0–0.5)
Eosinophils Relative: 3 %
HCT: 36.2 % — ABNORMAL LOW (ref 39.0–52.0)
Hemoglobin: 11.9 g/dL — ABNORMAL LOW (ref 13.0–17.0)
Immature Granulocytes: 2 %
Lymphocytes Relative: 12 %
Lymphs Abs: 1.2 10*3/uL (ref 0.7–4.0)
MCH: 29.1 pg (ref 26.0–34.0)
MCHC: 32.9 g/dL (ref 30.0–36.0)
MCV: 88.5 fL (ref 80.0–100.0)
Monocytes Absolute: 0.7 10*3/uL (ref 0.1–1.0)
Monocytes Relative: 7 %
Neutro Abs: 7.3 10*3/uL (ref 1.7–7.7)
Neutrophils Relative %: 75 %
Platelets: 225 10*3/uL (ref 150–400)
RBC: 4.09 MIL/uL — ABNORMAL LOW (ref 4.22–5.81)
RDW: 14.1 % (ref 11.5–15.5)
WBC: 9.6 10*3/uL (ref 4.0–10.5)
nRBC: 0 % (ref 0.0–0.2)

## 2019-09-30 MED ORDER — BLOOD PRESSURE CONTROL BOOK
Freq: Once | Status: DC
  Filled 2019-09-30: qty 1

## 2019-09-30 MED ORDER — ACETAMINOPHEN 325 MG PO TABS: 325.0000 mg | ORAL_TABLET | ORAL | Status: DC | PRN

## 2019-09-30 NOTE — Progress Notes (Signed)
Patient ID: Andrew Perez, male   DOB: 03-Sep-1965, 54 y.o.   MRN: [id number] Met with the patient and his wife to review role of the CM and address nursing issues and collaboration with SW Margreta Journey) to facilitate discharge. Reviewed risk factors for hemorrhagic stroke and effects of the stroke. Wife given handouts for management of HTN, information on dyslipidemia and eating/exercise after a stroke. Reviewed recommendations for limiting sodium in the diet Reviewed skin issues/management including an un-stageable to the nasal bridge. [name]

## 2019-09-30 NOTE — Progress Notes (Signed)
Speech Language Pathology Daily Session Note  Patient Details  Name: Andrew Perez MRN: [id number] Date of Birth: 1965/10/06  Today's Date: 09/30/2019 SLP Individual Time: 6803-2122 SLP Individual Time Calculation (min): 61 min  Short Term Goals: Week 1: SLP Short Term Goal 1 (Week 1): Pt will recall novel and daily information with min A verbal cues for use of compensatory strategies. SLP Short Term Goal 2 (Week 1): Pt will complete semi-complex to complex problem solving tasks with min A verbal cues. SLP Short Term Goal 3 (Week 1): Pt will self-recognize and self-correct functional and verbal errors with min A verbal cues. SLP Short Term Goal 4 (Week 1): Pt will complete mildly complex word finding tasks with min A verbal cues for word finding strategies. SLP Short Term Goal 5 (Week 1): Pt will demonstrate organized thought strutcure at the sentence level with min A verbal cues.  Skilled Therapeutic Interventions: Pt was seen for skilled ST targeting cognitive goals. Pt with good recall of last targets of ST session, however Max A verbal cues required for recall of most current medications (pt able to recall names of 2/11 without cues). Pt became discouraged during review, stating that everything is different now. He organized a TID pill box according to list of current medications with overall Min A verbal cues for complex problem solving and error awareness. Would recommend continuing to target complex medication management to increase functional independence with this task in future ST sessions. Pt expressed need to have BM during session, therefore assisted pt with safe ambulation via RW to toilet - he was slightly impulsive initially, attempting to stand prior to having RW set up or seatbelt unfastened, suspect due to urgency. One verbal cue was also required for functional sequencing. Pt left with NT present to assist with transfer back to chair from restroom. Continue per current plan of  care.        Pain Pain Assessment Pain Scale: 0-10 Pain Score: 0-No pain  Therapy/Group: Individual Therapy  Arbutus Leas 09/30/2019, 7:11 AM

## 2019-09-30 NOTE — Progress Notes (Signed)
**Note Andrew-Identified via Obfuscation** Chaplain responded to spiritual consult to visit with wife who was concerned her husband is not responding well after his recent stroke.  Wife was engaged with another provider so Chaplain stepped to bedside and spoke with Andrew Perez who was alert and talkative.  He is looking forward to going home, hopefully on Wednesday.  He seemed engaged and anxious to leave the hospital and begin the next chapter in his life.  Chaplain is available for additional consult as requested.  Andrew Perez Chaplain Resident

## 2019-09-30 NOTE — Progress Notes (Signed)
Inpatient Rehabilitation Care Coordinator Assessment and Plan  Patient Details  Name: Andrew Perez MRN: [id number] Date of Birth: 1965/09/28  Today's Date: 09/30/2019  Problem List:  Patient Active Problem List   Diagnosis Date Noted  . Cerebral aneurysm, R PICA s/p coiling 09/27/2019  . 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 Prolactinoma ([place]) 09/27/2019  . DVT of leg (deep venous thrombosis) ([place]) 09/27/2019  . Obstructive sleep apnea 09/27/2019  . Hypothyroidism 09/27/2019  . ICH (intracerebral hemorrhage) ([place]) 09/27/2019  . Intraventricular hemorrhage ([place]) 09/17/2019  . Hyperlipidemia 07/04/2016  . Hypokalemia 07/04/2016  . Leukocytosis 07/04/2016  . Morbid obesity ([place]) 06/29/2016  . 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 Hypertensive emergency 06/29/2016  . IVH (intraventricular hemorrhage) ([place]) 06/28/2016   Past Medical History:  Past Medical History:  Diagnosis Date  . Hypertension   . IVH (intraventricular hemorrhage) ([place]) 2018   due to HTN  . Prolactinoma ([place])    followed by [facility]  . Sleep apnea    Past Surgical History:  Past Surgical History:  Procedure Laterality Date  . IR 3D INDEPENDENT WKST  09/19/2019  . IR ANGIO INTRA EXTRACRAN SEL COM CAROTID INNOMINATE BILAT MOD SED  09/18/2019  . IR ANGIO VERTEBRAL SEL SUBCLAVIAN INNOMINATE UNI L MOD SED  09/18/2019  . IR ANGIO VERTEBRAL SEL VERTEBRAL UNI R MOD SED  09/18/2019  . IR ANGIO VERTEBRAL SEL VERTEBRAL UNI R MOD SED  09/19/2019  . IR ANGIOGRAM FOLLOW UP STUDY  09/19/2019  . IR CT HEAD LTD  09/19/2019  . IR IVC FILTER PLMT / S&I /IMG GUID/MOD SED  09/25/2019  . IR NEURO EACH ADD'L AFTER BASIC UNI RIGHT (MS)  09/19/2019  . IR TRANSCATH/EMBOLIZ  09/19/2019  . IR US GUIDE VASC ACCESS RIGHT  09/18/2019  . IR US GUIDE VASC ACCESS RIGHT  09/19/2019  . RADIOLOGY WITH ANESTHESIA N/A 09/19/2019   Procedure: RADIOLOGY WITH ANESTHESIA ANUERYSM COILING;  Surgeon: Pedro Earls, MD;  Location: [facility];   Service: Radiology;  Laterality: N/A;  . TONSILLECTOMY     Social History:  reports that he has never smoked. He has never used smokeless tobacco. He reports that he does not drink alcohol and does not use drugs.  Family / Support Systems Marital Status: Married Patient Roles: Spouse Children: 47 year old in home Anticipated Caregiver: spuse: Seth Bake Ability/Limitations of Caregiver: none  Social History Preferred language: English Religion:  Read: Yes Write: Yes   Abuse/Neglect Abuse/Neglect Assessment Can Be Completed: Yes Physical Abuse: Denies Verbal Abuse: Denies Sexual Abuse: Denies Exploitation of patient/patient's resources: Denies  Emotional Status Pt's affect, behavior and adjustment status: yes. spouse states moods fluctate Recent Psychosocial Issues: no Psychiatric History: no Substance Abuse History: no  Patient / Family Perceptions, Expectations & Goals Pt/Family understanding of illness & functional limitations: yes. Pt/family expectations/goals: Goal to discharge home  [employer]: None Premorbid Home Care/DME Agencies: None Transportation available at discharge: spouse able to transport Resource referrals recommended: Neuropsychology  Discharge Planning Living Arrangements: Spouse/significant other Support Systems: Spouse/significant other Type of Residence: Private residence (1 level home: 4-5 steps to enter, railings right R) Insurance Resources: Multimedia programmer (specify) Psychologist, counselling) Care Coordinator Anticipated Follow Up Needs: HH/OP Expected length of stay: 10-13 Days  Clinical Impression SW entered room, patient resting. Spouse at bedside. Sw introduced self, explained role and process. Addressed questions and concerns, SW will continue to follow up. Spouse asking for more education on patient diagnosis. SW informed  nurse case worker.  Dyanne Iha 09/30/2019, 1:58 PM

## 2019-09-30 NOTE — Progress Notes (Signed)
Inpatient Rehabilitation  Patient information reviewed and entered into eRehab system by Nakeysha Pasqual M. Zane Pellecchia, M.A., CCC/SLP, PPS Coordinator.  Information including medical coding, functional ability and quality indicators will be reviewed and updated through discharge.    

## 2019-09-30 NOTE — Progress Notes (Signed)
Occupational Therapy Session Note  Patient Details  Name: RADEN BYINGTON MRN: [id number] Date of Birth: 10-17-1965  Today's Date: 09/30/2019 OT Individual Time: 1594-5859 OT Individual Time Calculation (min): 76 min    Short Term Goals: Week 1:  OT Short Term Goal 1 (Week 1): STGs=LTGs due to ELOS  Skilled Therapeutic Interventions/Progress Updates:    Pt in bed to start session, agreeable to participation in shower to begin.   He was able to transfer from supine to sit with supervision to start and then completed functional mobility to the wide 3:1 in the shower with min guard assist.  He needed mod instructional cueing for initiation and completion of showering secondary to cognitive processing delays.  He at first just rinsed himself off with water and then stopped.  When therapist asked him what was he supposed to do next, he stated "Dry Off".  He was instructed that he needed to use soap to wash off next, and then he was able to complete washing his hair and most of his body with supervision.  Therapist assisted with washing his lower legs and feet secondary to not having a LH sponge available.  He dried off with min guard assist sit to stand and then transferred out to the wheelchair with min guard using the RW for grooming and dressing tasks.  He completed all dressing with min guard except for TEDs, which were total assist, as well as shoes which were min assist level.  Finished session with pt being transported down to the ortho gym where he completed 1 set of 10 mins using the [place] for strengthening.  He was able to maintain RPMs at 25 with resistance level at 7.  Oxygen 98% with HR at 65 post exercise.  Returned to the room with pt sitting up in the wheelchair with call button and phone in reach and safety belt in place.    Therapy Documentation Precautions:  Precautions Precautions: Fall Precaution Comments: L sided weekness, obese, bilat LE edema-wears compression  stocking Restrictions Weight Bearing Restrictions: No   Pain: Pain Assessment Pain Scale: Faces Pain Score: 0-No pain ADL: See Care Tool Section for some details of mobility and selfcare  Therapy/Group: Individual Therapy  Raedyn Wenke OTR/L 09/30/2019, 12:15 PM

## 2019-09-30 NOTE — Progress Notes (Signed)
[facility] Individual Statement of Services  Patient Name:  Andrew Perez  Date:  09/30/2019  Welcome to the [facility].  Our goal is to provide you with an individualized program based on your diagnosis and situation, designed to meet your specific needs.  With this comprehensive rehabilitation program, you will be expected to participate in at least 3 hours of rehabilitation therapies Monday-Friday, with modified therapy programming on the weekends.  Your rehabilitation program will include the following services:  Physical Therapy (PT), Occupational Therapy (OT), Speech Therapy (ST), 24 hour per day rehabilitation nursing, Therapeutic Recreaction (TR), Neuropsychology, Care Coordinator, Rehabilitation Medicine, Nutrition Services, Pharmacy Services and Other  Weekly team conferences will be held on Wednesdays to discuss your progress.  Your Inpatient Rehabilitation Care Coordinator will talk with you frequently to get your input and to update you on team discussions.  Team conferences with you and your family in attendance may also be held.  Expected length of stay: 13-16 Days Overall anticipated outcome: MOD I to Supervision  Depending on your progress and recovery, your program may change. Your Inpatient Rehabilitation Care Coordinator will coordinate services and will keep you informed of any changes. Your Inpatient Rehabilitation Care Coordinator's name and contact numbers are listed  below.  The following services may also be recommended but are not provided by the [facility]:    [place] will be made to provide these services after discharge if needed.  Arrangements include referral to agencies that provide these services.  Your insurance has been verified to be:  [place] Your primary doctor is:  NO PCP  Pertinent information will be shared  with your doctor and your insurance company.  Inpatient Rehabilitation Care Coordinator:  Erlene Quan, [place] or [phone]  Information discussed with and copy given to patient by: Dyanne Iha, 09/30/2019, 11:28 AM

## 2019-09-30 NOTE — IPOC Note (Signed)
Overall Plan of Care [facility]) Patient Details Name: Andrew Perez MRN: [id number] DOB: 05/18/1965  Admitting Diagnosis: IVH (intraventricular hemorrhage) [facility])  Hospital Problems: Principal Problem:   IVH (intraventricular hemorrhage) (HCC) Active Problems:   ICH (intracerebral hemorrhage) ([place])     Functional Problem List: Nursing Bladder, Edema, Endurance, Motor, Nutrition, Pain, Safety, Skin Integrity  PT Balance, Perception, Behavior, Safety, Edema, Sensory, Endurance, Skin Integrity, Motor, Nutrition, Pain  OT Balance, Safety, Cognition, Edema, Motor, Skin Integrity  SLP Cognition  TR         Basic ADL's: OT Grooming, Bathing, Dressing, Toileting     Advanced  ADL's: OT Light Housekeeping     Transfers: PT Bed Mobility, Bed to Chair, Car, Manufacturing systems engineer, Metallurgist: PT Ambulation, Stairs     Additional Impairments: OT None  SLP Communication, Social Cognition expression Awareness, Memory, Problem Solving  TR      Anticipated Outcomes Item Anticipated Outcome  Self Feeding No goal  Swallowing      Basic self-care  Supervision/cuing  Toileting  Supervision/cuing   Bathroom Transfers Supervision/cuing  Bowel/Bladder  remain continent, manage urgency  Transfers  supervision  Locomotion  supervision  Communication  Supervision A  Cognition  Supervision A  Pain  less than 4  Safety/Judgment  Patient remain free of falls, skin breakdown and infection   Therapy Plan: PT Intensity: Minimum of 1-2 x/day ,45 to 90 minutes PT Frequency: 5 out of 7 days PT Duration Estimated Length of Stay: 7-10 days OT Intensity: Minimum of 1-2 x/day, 45 to 90 minutes OT Frequency: 5 out of 7 days OT Duration/Estimated Length of Stay: 7-10 days SLP Intensity: Minumum of 1-2 x/day, 30 to 90 minutes SLP Frequency: 3 to 5 out of 7 days SLP Duration/Estimated Length of Stay: 13-16 days   Due to the current state of emergency, patients may not be  receiving their 3-hours of Medicare-mandated therapy.   Team Interventions: Nursing Interventions Patient/Family Education, Bladder Management, Disease Management/Prevention, Skin Care/Wound Management, Discharge Planning, Psychosocial Support  PT interventions Ambulation/gait training, Community reintegration, DME/adaptive equipment instruction, Neuromuscular re-education, Psychosocial support, Stair training, UE/LE Strength taining/ROM, Wheelchair propulsion/positioning, Discharge planning, Training and development officer, Functional electrical stimulation, Pain management, Skin care/wound management, Therapeutic Activities, UE/LE Coordination activities, Cognitive remediation/compensation, Disease management/prevention, Functional mobility training, Patient/family education, Splinting/orthotics, Therapeutic Exercise, Visual/perceptual remediation/compensation  OT Interventions Balance/vestibular training, Discharge planning, Pain management, Self Care/advanced ADL retraining, Therapeutic Activities, UE/LE Coordination activities, Therapeutic Exercise, Patient/family education, Functional mobility training, Disease mangement/prevention, Cognitive remediation/compensation, Community reintegration, Engineer, drilling, Neuromuscular re-education, Psychosocial support, UE/LE Strength taining/ROM  SLP Interventions Cognitive remediation/compensation, Cueing hierarchy, Functional tasks, Internal/external aids, Patient/family education, Speech/Language facilitation  TR Interventions    SW/CM Interventions Discharge Planning, Psychosocial Support, Patient/Family Education   Barriers to Discharge MD  Medical stability and Weight  Nursing      PT Inaccessible home environment, Home environment access/layout, Weight    OT Weight, Medical stability    SLP      SW       Team Discharge Planning: Destination: PT-Home ,OT- Home , SLP-Home Projected Follow-up: PT-Home health PT, 24 hour  supervision/assistance, OT-  Home health OT, SLP-Home Health SLP, Outpatient SLP, 24 hour supervision/assistance Projected Equipment Needs: PT-To be determined, Other (comment), OT- To be determined, SLP-None recommended by SLP Equipment Details: PT-pt's wife reports he has a RW, OT-  Patient/family involved in discharge planning: PT- Patient, Family Midwife,  OT-Patient, Family member/caregiver, SLP-Patient  MD ELOS: 13-15d Medical  Rehab Prognosis:  Good Assessment:  54 year old male with history of morbid obesity- BMI 58, IVH 2018, OSA, prolactinoma ([facility] at [facility]) who was admitted on 06/01/21who was admitted on 09/17/2019 with acute onset of HA with dizziness and episode of vomiting. He was noted to be hypertensive at admission and found to have small acute IVH involving fourth ventricle. Bleed was felt to be hypertensive in nature and he was started on Cleviprex for BP management. Dr. Ronnald Ramp was consulted for input and recommended CT angio for work-up as well as SBP less than 140. And has signed off and is no hydrocephalus noted. CTA showed distal 5 mm dissectingright-PICA aneurysm and he underwent embolizationof aneurysm with sacrifice of right PICA at telovelotonsillar segmentby Dr. Christophe Louis on 06/03. Postprocedure had nausea and repeat CT head showed R>Lcerebellar infarct. MRI brain confirmed evolving infarct similar in extension compared with prior CT. 2D echo showed EF 55 to 60%,moderate LVH and moderately dilated left atrium. Patient placed on aspirin for secondary stroke prevention.   His blood pressure continued to be labile requiring Cleviprex.BP meds added and titrated upwards and Cleviprex weaned off yesterday. He has had fluctuating mental status and repeat CT head 6/8 showed evolving nonhemorrhagic cerebellar infarcts right greater than left, progressive mass-effect with effacement of sulci posterior fossa/supratentorial structures, and slight increase in  size of frontal horn of lateral ventricles and third ventricle. BLE Dopplers donedue to LE edema06/08 revealing DVT in left posterior tibial vein andIVC filter placed by radiology on 06/09 as not felt to be a candidate for anticoagulation. His lethargy is resolving but he continues to be limited by left-sided weakness with unsteady gait. CIR was recommended due to functional decline   Now requiring 24/7 Rehab RN,MD, as well as CIR level PT, OT and SLP.  Treatment team will focus on ADLs and mobility with goals set at ModI/Sup See Team Conference Notes for weekly updates to the plan of care

## 2019-09-30 NOTE — Progress Notes (Signed)
Physical Therapy Session Note  Patient Details  Name: Andrew Perez MRN: [id number] Date of Birth: [birth year]  Today's Date: 09/30/2019 PT Individual Time: 1420-1530 PT Individual Time Calculation (min): 70 min   Short Term Goals: Week 1:  PT Short Term Goal 1 (Week 1): = to LTGs based on ELOS  Skilled Therapeutic Interventions/Progress Updates: Pt presented in bed with wife present agreeable to therapy. Pt denies pain during session. Performed bed mobility with use of bed features and CGA. PTA donned shoes total A for time management. Pt performed ambulatory transfer to w/c CGA with RW. Pt transported to rehab gym and performed stand pivot to mat in same manner as prior. Pt participated in seated and standing balance activities including toe taps to 4in step progressing to step ups with RW to 4in step x 10. Participated in x 2 bouts of horseshoes with RW for dynamic reaching with seated rest between bouts. Pt also participated in use of rebounder in seated and standing position. Pt with x 1 LOB posteriorly while performing in standing with descent SpO2 checked during activities HR 66 SpO2 96. Pt also participated in standing pipe tree without AD. Pt required min cues to complete, however no LOB noted with activity. Pt indicated some increased fatigue vitals assessed 153/84 (102) checked after 5 min with pt performing additional pipe tree in sitting 153/81 (97). Pt transferred back to w/c and transported back to room entrance. Pt ambulated x 75f to bed with RW and CGA with verbal cues to stay within RW. Pt was able to return to supine with CGA and use of bed features with increased effort and reposition to comfort with supervision and use of features. BP checked once pt in supine 121/63 (78). Pt remained in bed at end of session with call bell within reach, wife present, and needs met.      Therapy Documentation Precautions:  Precautions Precautions: Fall Precaution Comments: L sided weekness,  obese, bilat LE edema-wears compression stocking Restrictions Weight Bearing Restrictions: No General:   Vital Signs: Therapy Vitals Temp: (!) 97.5 F (36.4 C) Temp Source: Oral Pulse Rate: 64 Resp: 17 BP: 139/65 Patient Position (if appropriate): Lying Oxygen Therapy SpO2: 99 % O2 Device: Room Air Pain: Pain Assessment Pain Scale: 0-10 Pain Score: 0-No pain  Therapy/Group: Individual Therapy  Margarett Viti  Trysta Showman, PTA  09/30/2019, 3:58 PM

## 2019-09-30 NOTE — Progress Notes (Signed)
[place] PHYSICAL MEDICINE & REHABILITATION PROGRESS NOTE   Subjective/Complaints: Had a good night.  Wore CPAP. No new complaints  ROS: Patient denies CP, SOB, N/V/D    Objective:   No results found. No results for input(s): WBC, HGB, HCT, PLT in the last 72 hours. Recent Labs    09/28/19 0438  NA 140  K 3.5  CL 99  CO2 31  GLUCOSE 105*  BUN 16  CREATININE 0.76  CALCIUM 9.1    Intake/Output Summary (Last 24 hours) at 09/30/2019 0958 Last data filed at 09/30/2019 0745 Gross per 24 hour  Intake 880 ml  Output 1750 ml  Net -870 ml     Physical Exam: Vital Signs Blood pressure (!) 144/64, pulse 64, temperature 98 F (36.7 C), resp. rate 18, height 5\' 11"  (1.803 m), weight (!) 199.6 kg, SpO2 97 %.  General: No acute distress Mood and affect are appropriate Heart: Regular rate and rhythm no rubs murmurs or extra sounds Lungs: Clear to auscultation, breathing unlabored, no rales or wheezes Abdomen: Positive bowel sounds, soft nontender to palpation, nondistended Extremities: No clubbing, cyanosis, or edema Skin: No evidence of breakdown, no evidence of rash  Musculoskeletal:  General: 1+ LE edema Cervical back: Normal range of motion. Neurological: He is oriented to person, place, and time. He appearsfairly alert Nocranial nerve deficitor sensory deficit. UE: 3-/5 prox to 4-/5 distally. LE: 3- HF, KE and 3+ ADF/PF. No focal sensory deficits. Reasonable insight and awareness. No dysmetria, good depth perception--neuro exam stable Skin: Abrasion/pressure injury with eschar to bridge of nose--stable      Assessment/Plan: 1. 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  Physiatrist is providing close team supervision and 24 hour management of active medical problems listed below.  Physiatrist and rehab team continue to assess barriers  to discharge/monitor patient progress toward functional and medical goals  Care Tool:  Bathing    Body parts bathed by patient: Right arm, Left arm, Chest, Abdomen, Front perineal area, Buttocks, Right upper leg, Left upper leg, Face   Body parts bathed by helper: Right lower leg, Left lower leg     Bathing assist Assist Level: Minimal Assistance - Patient > 75%     Upper Body Dressing/Undressing Upper body dressing   What is the patient wearing?: Pull over shirt    Upper body assist Assist Level: Supervision/Verbal cueing    Lower Body Dressing/Undressing Lower body dressing      What is the patient wearing?: Pants     Lower body assist Assist for lower body dressing: Contact Guard/Touching assist     Toileting Toileting    Toileting assist Assist for toileting: Moderate Assistance - Patient 50 - 74% (for use of urinal) Assistive Device Comment: urinal   Transfers Chair/bed transfer  Transfers assist     Chair/bed transfer assist level: Contact Guard/Touching assist     Locomotion Ambulation   Ambulation assist   Ambulation activity did not occur: Safety/medical concerns (requires use of RW - did not use AD baseline)  Assist level: Contact Guard/Touching assist Assistive device: Walker-rolling Max distance: 10'   Walk 10 feet activity   Assist  Walk 10 feet activity did not occur: Safety/medical concerns        Walk 50 feet activity   Assist Walk 50 feet with 2 turns activity did not occur: Safety/medical concerns         Walk 150  feet activity   Assist Walk 150 feet activity did not occur: Safety/medical concerns         Walk 10 feet on uneven surface  activity   Assist Walk 10 feet on uneven surfaces activity did not occur: Safety/medical concerns         Wheelchair     Assist Will patient use wheelchair at discharge?: No (TBD)             Wheelchair 50 feet with 2 turns activity    Assist             Wheelchair 150 feet activity     Assist          Blood pressure (!) 144/64, pulse 64, temperature 98 F (36.7 C), resp. rate 18, height 5\' 11"  (1.803 m), weight (!) 199.6 kg, SpO2 97 %.  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  -pt had IVCF placed on 6/9.  -antiplatelet therapy:ASA 3. Pain Management:Tylenol prn 4. Mood:LCSW to follow for evaluation and support. -antipsychotic agents:N/A 5. Neuropsych: This patientiscapable of making decisions on hisown behalf. 6. Skin/Wound Care:Routine pressure relief measures. 7. Fluids/Electrolytes/Nutrition: encourage appropriate po  I personally reviewed the patient's labs today.  WNL. 8. HTN: Monitor BP tid--continue catapres TTS-3 patch, Norvasc, Hydralazine, HCTZ, Avapro and aldactone.  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 10. OSA: Continue CPAP whenever napping and at nights.  -wife to bring home face mask? 11.Hypoxia: Likely multifactorial- body habitus, intermittent napping, OSA. Encourage IS and wean oxygen to off.  12. Morbid Obesity: BMI-58. Heart Healthy diet.   -dietitian consulted for counseling 13. Bradycardia: Intermittent and asymptomatic. 60's today  14. Hyponatremia: resolved  15. Leucocytosis/ABLA:monitor CBC  16. Impaired Fasting Glucose:  Hgb A1c- 4.7.  17. Dyslipidemia: On Lipitor.    LOS: 3 days A FACE TO FACE EVALUATION WAS PERFORMED  Charlett Blake 09/30/2019, 9:58 AM

## 2019-10-01 ENCOUNTER — Inpatient Hospital Stay (HOSPITAL_COMMUNITY): Payer: 59 | Admitting: Occupational Therapy

## 2019-10-01 ENCOUNTER — Inpatient Hospital Stay (HOSPITAL_COMMUNITY): Payer: 59 | Admitting: Speech Pathology

## 2019-10-01 ENCOUNTER — Encounter (HOSPITAL_COMMUNITY): Payer: 59

## 2019-10-01 ENCOUNTER — Inpatient Hospital Stay (HOSPITAL_COMMUNITY): Payer: 59 | Admitting: Physical Therapy

## 2019-10-01 NOTE — Progress Notes (Signed)
Occupational Therapy Session Note  Patient Details  Name: Andrew Perez MRN: [id number] Date of Birth: 1965/07/19  Today's Date: 10/01/2019 OT Individual Time: 4008-6761 OT Individual Time Calculation (min): 44 min    Short Term Goals: Week 1:  OT Short Term Goal 1 (Week 1): STGs=LTGs due to ELOS  Skilled Therapeutic Interventions/Progress Updates:    Pt up in wheelchair to start agreeable to participation in therapy session.  He reported getting cleaned up earlier today secondary to having a urinary incontinence episode last night/this morning and having his bed soaked.  Therapist took him down to the therapy gym where he completed stand pivot transfer to the mat with min guard assist.  Had pt work on Lyman coordination and problem solving in sitting while engaged in PVC pipe puzzle task.  He was able to complete one puzzle in sitting and then another in standing.  He needed close supervision for sitting balance with min questioning cueing to complete puzzle to match the given diagram as he had errors initially.  He was able to then complete nine hole peg test for coordination in sitting using BUEs.  He completed this in 36 seconds in both the right and left UEs.  Finished session with functional mobility back to the room with with min guard assist using the RW for safety.  He was left up in the wheelchair at end of session with call button and phone in reach and safety alarm belt in place.    Therapy Documentation Precautions:  Precautions Precautions: Fall Precaution Comments: L sided weekness, obese, bilat LE edema-wears compression stocking Restrictions Weight Bearing Restrictions: No  Pain: Pain Assessment Pain Scale: 0-10 Pain Score: 0-No pain ADL: See Care Tool Section for some details of mobility and selfcare  Therapy/Group: Individual Therapy  Dalaney Needle OTR/L 10/01/2019, 3:49 PM

## 2019-10-01 NOTE — Progress Notes (Signed)
[place] PHYSICAL MEDICINE & REHABILITATION PROGRESS NOTE   Subjective/Complaints:  No problems in theraoy , no bowel/bladder issues  ROS: Patient denies CP, SOB, N/V/D    Objective:   No results found. Recent Labs    09/30/19 1354  WBC 9.6  HGB 11.9*  HCT 36.2*  PLT 225   Recent Labs    09/30/19 1354  NA 138  K 4.1  CL 100  CO2 29  GLUCOSE 130*  BUN 16  CREATININE 1.03  CALCIUM 9.5    Intake/Output Summary (Last 24 hours) at 10/01/2019 0726 Last data filed at 09/30/2019 1838 Gross per 24 hour  Intake 640 ml  Output 1175 ml  Net -535 ml     Physical Exam: Vital Signs Blood pressure 126/65, pulse 64, temperature 98.6 F (37 C), resp. rate 19, height 5\' 11"  (1.803 m), weight (!) 199.6 kg, SpO2 100 %.  General: No acute distress Mood and affect are appropriate Heart: Regular rate and rhythm no rubs murmurs or extra sounds Lungs: Clear to auscultation, breathing unlabored, no rales or wheezes Abdomen: Positive bowel sounds, soft nontender to palpation, nondistended Extremities: No clubbing, cyanosis, or edema  Musculoskeletal:  General: 1+ LE edema Cervical back: Normal range of motion. Neurological: He is oriented to person, place, and time. He appearsfairly alert Nocranial nerve deficitor sensory deficit. UE: 3-/5 prox to 4-/5 distally. LE: 3- HF, KE and 3+ ADF/PF. No focal sensory deficits. Reasonable insight and awareness. No dysmetria, good depth perception--neuro exam stable Skin: Abrasion/pressure injury with eschar to bridge of nose--stable      Assessment/Plan: 1. 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  Physiatrist is providing close team supervision and 24 hour management of active medical problems listed below.  Physiatrist and rehab team continue to assess barriers to discharge/monitor patient progress  toward functional and medical goals  Care Tool:  Bathing    Body parts bathed by patient: Right arm, Left arm, Chest, Abdomen, Front perineal area, Buttocks, Right upper leg, Left upper leg, Face   Body parts bathed by helper: Right lower leg, Left lower leg     Bathing assist Assist Level: Minimal Assistance - Patient > 75%     Upper Body Dressing/Undressing Upper body dressing   What is the patient wearing?: Pull over shirt    Upper body assist Assist Level: Supervision/Verbal cueing    Lower Body Dressing/Undressing Lower body dressing      What is the patient wearing?: Pants     Lower body assist Assist for lower body dressing: Contact Guard/Touching assist     Toileting Toileting    Toileting assist Assist for toileting: Moderate Assistance - Patient 50 - 74% (for use of urinal) Assistive Device Comment: urinal   Transfers Chair/bed transfer  Transfers assist     Chair/bed transfer assist level: Contact Guard/Touching assist     Locomotion Ambulation   Ambulation assist   Ambulation activity did not occur: Safety/medical concerns (requires use of RW - did not use AD baseline)  Assist level: Contact Guard/Touching assist Assistive device: Walker-rolling Max distance: 34ft   Walk 10 feet activity   Assist  Walk 10 feet activity did not occur: Safety/medical concerns  Assist level: Contact Guard/Touching assist     Walk 50 feet activity   Assist Walk 50 feet with 2 turns activity did not occur: Safety/medical concerns         Walk 150  feet activity   Assist Walk 150 feet activity did not occur: Safety/medical concerns         Walk 10 feet on uneven surface  activity   Assist Walk 10 feet on uneven surfaces activity did not occur: Safety/medical concerns         Wheelchair     Assist Will patient use wheelchair at discharge?: No (TBD)             Wheelchair 50 feet with 2 turns activity    Assist             Wheelchair 150 feet activity     Assist          Blood pressure 126/65, pulse 64, temperature 98.6 F (37 C), resp. rate 19, height 5\' 11"  (1.803 m), weight (!) 199.6 kg, SpO2 100 %.  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  -pt had IVCF placed on 6/9.  -antiplatelet therapy:ASA 3. Pain Management:Tylenol prn 4. Mood:LCSW to follow for evaluation and support. -antipsychotic agents:N/A 5. Neuropsych: This patientiscapable of making decisions on hisown behalf. 6. Skin/Wound Care:Routine pressure relief measures. 7. Fluids/Electrolytes/Nutrition: encourage appropriate po  I personally reviewed the patient's labs today.  WNL. 8. HTN: Monitor BP tid--continue catapres TTS-3 patch, Norvasc, Hydralazine, HCTZ, Avapro and aldactone.  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 10. OSA: Continue CPAP whenever napping and at nights.  -wife to bring home face mask? 11.Hypoxia: Likely multifactorial- body habitus, intermittent napping, OSA. Encourage IS and wean oxygen to off.  12. Morbid Obesity: BMI-58. Heart Healthy diet.   -dietitian consulted for counseling 13. Bradycardia: Intermittent and asymptomatic. 60's today  14. Hyponatremia: resolved  15. Leucocytosis resolved /ABLA:mild and stable monitor  CBC  16. Impaired Fasting Glucose: Hgb A1c- 4.7.  17. Dyslipidemia: On Lipitor.    LOS: 4 days A FACE TO FACE EVALUATION WAS PERFORMED  Charlett Blake 10/01/2019, 7:26 AM

## 2019-10-01 NOTE — Progress Notes (Signed)
Speech Language Pathology Daily Session Note  Patient Details  Name: Andrew Perez MRN: [id number] Date of Birth: 1965-06-08  Today's Date: 10/01/2019 SLP Individual Time: 0730-0825 SLP Individual Time Calculation (min): 55 min  Short Term Goals: Week 1: SLP Short Term Goal 1 (Week 1): Pt will recall novel and daily information with min A verbal cues for use of compensatory strategies. SLP Short Term Goal 2 (Week 1): Pt will complete semi-complex to complex problem solving tasks with min A verbal cues. SLP Short Term Goal 3 (Week 1): Pt will self-recognize and self-correct functional and verbal errors with min A verbal cues. SLP Short Term Goal 4 (Week 1): Pt will complete mildly complex word finding tasks with min A verbal cues for word finding strategies. SLP Short Term Goal 5 (Week 1): Pt will demonstrate organized thought strutcure at the sentence level with min A verbal cues.  Skilled Therapeutic Interventions: Pt was seen for skilled ST targeting cognition. SLP facilitated session with repeating complex medication management activity to increase pt's independence with this task. Pt demonstrated improved accuracy and problem solving abilities when using list to organize TID pill box according to his list of current medications - completed with Supervision A verbal cues. Pt still required use of external aid to accurately recall medication names and functions. Chunking memory strategy also introduced to facilitate other ways to recall general number and functions of medications. During a semi-complex money management scenario activity, pt required Min A verbal cues for verbal problem solving and error awareness. Pt reports he was independent with complex finance management (including use of checkbook) prior to admission, therefore will target in future sessions. Pt did demonstrate some instances of word finding challenges and minimal verbal output during conversation and expressing abstract  thoughts. Responses are often vague and circumlocutory - Min A prompts and semantic cues provided for word finding in conversational tasks. Pt left in wheelchair with seatbelt alarm set and needs within reach. Continue per current plan of care.        Pain Pain Assessment Pain Scale: 0-10 Pain Score: 0-No pain  Therapy/Group: Individual Therapy  Arbutus Leas 10/01/2019, 7:04 AM

## 2019-10-01 NOTE — Progress Notes (Signed)
Physical Therapy Session Note  Patient Details  Name: Andrew Perez MRN: [id number] Date of Birth: [birth year]  Today's Date: 10/01/2019 PT Individual Time: 2119-4174 PT Individual Time Calculation (min): 41 min   and  Today's Date: 10/01/2019 PT Missed Time: 24 Minutes Missed Time Reason: Patient ill (Comment)  Short Term Goals: Week 1:  PT Short Term Goal 1 (Week 1): = to LTGs based on ELOS  Skilled Therapeutic Interventions/Progress Updates:    Session 1: Pt received sitting in w/c reporting he feels "groggy" from where he recently took his morning medications. With minimal encouragement pt agreeable to therapy session.  Transported to/from gym in w/c for time management and energy conservation. Sit>stand w/c>RW with close supervision when pt noted to have been incontinent of bladder. Transported back to room in w/c. Sit<>stands at sink with close supervision and pt able to complete UB and LB undressing/dressing with set-up assist and increased time in sitting/standing positions with UE support on sink counter. Performed hygiene standing at sink with set-up assist primarily but total assist for washing low back. Pt reports he realized he was incontinent but had already been incontinent this morning and cleaned up once today and didn't feel up to using the energy to do it again which is why he didn't mention it to the therapist. Educated pt on importance of hygiene for skin integrity. Transported back to gym. Sit>stand using RW with close supervision/CGA for safety/steadying. Therapist adjusted height of RW for improved pt fit and improved upright trunk posture. Gait training 33ft using RW with CGA/close supervision for safety - cuing for improved upright posture and closer AD management. Pt continues to report he feels "out of it." Transported back to room and assessed vitals: BP 130/75 (MAP 93), HR 57bpm and SpO2 99% on RA. Pt left seated in w/c with needs in reach and seat belt alarm  on.  Session 2: Pt received in bed eating lunch. Therapist inquired about sitting on EOB to complete meal and pt states "no, I've been sitting up all day." Therapist encouraged pt to participate in OOB mobility after eating and pt declined reporting he is still not feeling well and was "dry heaving" earlier. Pt left supine in bed with needs in reach and NT notified of pt continuing to not feel well. Missed 45 minutes of skilled physical therapy.  Therapy Documentation Precautions:  Precautions Precautions: Fall Precaution Comments: L sided weekness, obese, bilat LE edema-wears compression stocking Restrictions Weight Bearing Restrictions: No  Pain:   Session 1: No reports of pain throughout session.  Session 2: Reports some buttock pain while lying in the bed - notified NT to turn on rotating inflation feature of the air bed once pt has completed his meal to improve pressure relief.   Therapy/Group: Individual Therapy  Tawana Scale, PT, DPT 10/01/2019, 7:51 AM

## 2019-10-02 ENCOUNTER — Inpatient Hospital Stay (HOSPITAL_COMMUNITY): Payer: 59 | Admitting: Physical Therapy

## 2019-10-02 ENCOUNTER — Inpatient Hospital Stay (HOSPITAL_COMMUNITY): Payer: 59 | Admitting: Occupational Therapy

## 2019-10-02 ENCOUNTER — Inpatient Hospital Stay (HOSPITAL_COMMUNITY): Payer: 59 | Admitting: Speech Pathology

## 2019-10-02 ENCOUNTER — Inpatient Hospital Stay (HOSPITAL_COMMUNITY): Payer: 59

## 2019-10-02 ENCOUNTER — Encounter (HOSPITAL_COMMUNITY): Payer: 59 | Admitting: Psychology

## 2019-10-02 DIAGNOSIS — I82409 Acute embolism and thrombosis of unspecified deep veins of unspecified lower extremity: Secondary | ICD-10-CM

## 2019-10-02 LAB — URINALYSIS, ROUTINE W REFLEX MICROSCOPIC
Bilirubin Urine: NEGATIVE
Glucose, UA: NEGATIVE mg/dL
Hgb urine dipstick: NEGATIVE
Ketones, ur: NEGATIVE mg/dL
Leukocytes,Ua: NEGATIVE
Nitrite: NEGATIVE
Protein, ur: NEGATIVE mg/dL
Specific Gravity, Urine: 1.016 (ref 1.005–1.030)
pH: 5 (ref 5.0–8.0)

## 2019-10-02 NOTE — Progress Notes (Signed)
Physical Therapy Session Note  Patient Details  Name: ROLDAN LAFOREST MRN: [id number] Date of Birth: 04/21/1965  Today's Date: 10/02/2019 PT Individual Time: 7062-3762 PT Individual Time Calculation (min): 53 min   Short Term Goals: Week 1:  PT Short Term Goal 1 (Week 1): = to LTGs based on ELOS  Skilled Therapeutic Interventions/Progress Updates: Pt presents sitting in recliner, c/o some nausea from previous therapy session.  Spoke w/ OT, states nausea but only after completion of therapy, unsure of instigator.  Pt performed sit to stand and SPT recliner > w/c w/ supervision.  Pt performed seated reaching stacking and unstacking items w/o trunk support.  Pt BP taken in sitting at 137/79.  Pt performed standing reaching using bilateral UEs simultaneously w/o LOB.  BP checked in standing at 136/86.  Pt performed standing toe-taps to 3 3/4" platform, cone and then overhead press 3 x 10 w/ yellow T-ball w/o LOB.  Pt states nausea remains about the same.  Pt returned to room and amb approx. 5' from door to recliner w/o AD and CGA.  All needs in reach and spouse present.     Therapy Documentation Precautions:  Precautions Precautions: Fall Precaution Comments: L sided weekness, obese, bilat LE edema-wears compression stocking Restrictions Weight Bearing Restrictions: No General:   Vital Signs: Therapy Vitals Temp: (!) 97.4 F (36.3 C) Temp Source: Oral Pulse Rate: (!) 57 Resp: 16 BP: 117/68 Patient Position (if appropriate): Sitting Oxygen Therapy SpO2: 99 % O2 Device: Room Air Pain:no c/o pain.      Therapy/Group: Individual Therapy  Ladoris Gene 10/02/2019, 3:41 PM

## 2019-10-02 NOTE — Progress Notes (Signed)
Speech Language Pathology Daily Session Note  Patient Details  Name: Andrew Perez MRN: [id number] Date of Birth: 1965/11/02  Today's Date: 10/02/2019 SLP Individual Time: 9728-2060 SLP Individual Time Calculation (min): 43 min  Short Term Goals: Week 1: SLP Short Term Goal 1 (Week 1): Pt will recall novel and daily information with min A verbal cues for use of compensatory strategies. SLP Short Term Goal 2 (Week 1): Pt will complete semi-complex to complex problem solving tasks with min A verbal cues. SLP Short Term Goal 3 (Week 1): Pt will self-recognize and self-correct functional and verbal errors with min A verbal cues. SLP Short Term Goal 4 (Week 1): Pt will complete mildly complex word finding tasks with min A verbal cues for word finding strategies. SLP Short Term Goal 5 (Week 1): Pt will demonstrate organized thought strutcure at the sentence level with min A verbal cues.  Skilled Therapeutic Interventions: Pt was seen for skilled ST targeting cognition. Pt's wife, Seth Bake, present throughout session. SLP facilitated session with 2 checkbook management activities ranging in complexity. Pt completed semi-complex checkbook balancing with Supervision A verbal cueing for error awareness and problem solving. Initially Min A was required for problem solving, error awareness, and organization during more complex task, however quickly faded to Supervision with learning (~1/4 way through). Pt also completed a novel semi-complex card task with overall Min A verbal cues for recall and problem solving. Pt left sitting in chair with alarm set and needs within reach. Continue per current plan of care.          Pain Pain Assessment Pain Scale: 0-10 Pain Score: 0-No pain  Therapy/Group: Individual Therapy  Arbutus Leas 10/02/2019, 7:13 AM

## 2019-10-02 NOTE — Progress Notes (Signed)
Patient ID: Andrew Perez, male   DOB: 1966/03/24, 54 y.o.   MRN: [id number]   Team Conference Report to Patient/Family  Team Conference discussion was reviewed with the patient and caregiver, including goals, any changes in plan of care and target discharge date.  Patient and caregiver express understanding and are in agreement.  The patient has a target discharge date of 10/04/19.  Dyanne Iha 10/02/2019, 2:18 PM

## 2019-10-02 NOTE — Progress Notes (Signed)
Physical Therapy Session Note  Patient Details  Name: Andrew Perez MRN: [id number] Date of Birth: 1965/08/18  Today's Date: 10/02/2019 PT Individual Time: 0800-0830  PT Individual Time Calculation (min): 30 min   Short Term Goals: Week 1:  PT Short Term Goal 1 (Week 1): = to LTGs based on ELOS  Skilled Therapeutic Interventions/Progress Updates:   Pt received sitting in WC and agreeable to PT. Pt reports having been incontinent of bladder and requesting to perform bathing at sink. Pt hygiene performed at sink with set up assist from PT. Cues for safety with sit stand and use of at least 1 UE to improve stability and balance. Wife present to apply cream to rash on gluteal cleft. Pt's wife also voices concerns of pt having been incontinent overnight and had partially disassembled air mattress cover. Upper and lower body dressing performed with min assist for clothing set up and supervision assist for sit<>stand from [facility] to pull pants and underpants  to waist. Pt left sitting in [facility] with call bell in reach and all needs met.       Therapy Documentation Precautions:  Precautions Precautions: Fall Precaution Comments: L sided weekness, obese, bilat LE edema-wears compression stocking Restrictions Weight Bearing Restrictions: No    Vital Signs: Therapy Vitals Temp: (!) 97.4 F (36.3 C) Temp Source: Oral Pulse Rate: (!) 57 Resp: 16 BP: 117/68 Patient Position (if appropriate): Sitting Oxygen Therapy SpO2: 99 % O2 Device: Room Air Pain: denies   Therapy/Group: Individual Therapy  Lorie Phenix 10/02/2019, 6:09 PM

## 2019-10-02 NOTE — Progress Notes (Signed)
Occupational Therapy Session Note  Patient Details  Name: Andrew Perez MRN: [id number] Date of Birth: 01/09/1966  Today's Date: 10/02/2019 OT Individual Time: 6948-5462 OT Individual Time Calculation (min): 62 min    Short Term Goals: Week 1:  OT Short Term Goal 1 (Week 1): STGs=LTGs due to ELOS  Skilled Therapeutic Interventions/Progress Updates:    Pt in wheelchair to start session with spouse present.  He was able to donn his shoes sitting in the bedside recliner with supervision, but did need mod instructional cueing to recognize that the left shoe was not on completely at the heel and that the tongue had also been pushed down in the shoe.  Discussed with pt's spouse his current progress and limitations both physically and cognitively and how to assist him at home with progression of cognitive tasks by changing the types of cueing given as well as increasing or limiting distractions.  She plans to be present tomorrow for session when ADL will be performed.  Had pt ambulate with the RW down to the therapy gym.  Min instructional cueing for upright posture as well as staying closer to the RW.  Once in the gym had pt work on 15 piece vertical puzzle with diagram for reference.  He needed increased time and overall mod assist to complete.  Next, had him work on Oncologist grocery list given to him in writing.  He was able to recall 3/5 items without cueing, but then needed min questioning cueing to recall the last two after approximately a 5 minute delay.  Therapist worked on vestibular testing the rest of the session until return to the room.  Tracking of therapist's finger was WFLs, as well as convergence.  He demonstrated a positive VOR cancellation test with catch up saccades noted with both left and right head movements.  He did not however report any dizziness with test.  Gaze stabilization was WFLs when tested as well.  Finished session with pt returning to the room at min guard assist  level using the RW.  Once in the room and resting in the recliner, he reported some nausea and had an episode of dry heaving.  He reported slight dizziness when he sat down along with the nausea.  Pt reported feeling better after this brief episode.  Finished session with pt in the wheelchair and with spouse present.  Call button and phone in reach.    Therapy Documentation Precautions:  Precautions Precautions: Fall Precaution Comments: L sided weekness, obese, bilat LE edema-wears compression stocking Restrictions Weight Bearing Restrictions: No  Pain: Pain Assessment Pain Score: 0-No pain ADL: See Care Tool Section for some details of mobility and selfcare  Therapy/Group: Individual Therapy  Lessa Huge OTR/L 10/02/2019, 3:44 PM

## 2019-10-02 NOTE — Progress Notes (Signed)
At the time of this writing, urine sample pending for UA and Cx.  Patient has voided with wife present in room but forgotten to use urinal provided.  New urinal placed at bedside and patient and nursing staff reminded of need to obtain sample.  Mardy Lucier B. Rulon Eisenmenger, MSN, RN, CNL

## 2019-10-02 NOTE — Plan of Care (Signed)
  Problem: RH BLADDER ELIMINATION Goal: RH STG MANAGE BLADDER WITH ASSISTANCE Description: STG Manage Bladder With Assistance Mod I assist Outcome: Progressing   Problem: RH SKIN INTEGRITY Goal: RH STG SKIN FREE OF INFECTION/BREAKDOWN Description: Skin remain free of breakdown and infection while on rehab with mod I  assist Outcome: Progressing   Problem: RH PAIN MANAGEMENT Goal: RH STG PAIN MANAGED AT OR BELOW PT'S PAIN GOAL Description: Less than 4 Outcome: Progressing   Problem: RH KNOWLEDGE DEFICIT Goal: RH STG INCREASE KNOWLEDGE OF HYPERTENSION Description: Patient and wife will be able to manage HTN using medications and dietary restrictions, low salt, low fat diet per handouts/resources and educational information independently Outcome: Progressing Goal: RH STG INCREASE KNOWLEGDE OF HYPERLIPIDEMIA Description: Patient will be able to manage HLD using medications and dietary restrictions, low salt, low fat diet per handouts/resources and educational information independent Outcome: Progressing Goal: RH STG INCREASE KNOWLEDGE OF STROKE PROPHYLAXIS Description: Patient and wife will be able to manage stroke prophyaxis independently using handouts and educational resources provided Outcome: Progressing

## 2019-10-02 NOTE — Progress Notes (Signed)
Lower venous duplex       has been completed. Preliminary results can be found under CV proc through chart review. Ezmeralda Stefanick, BS, RDMS, RVT   

## 2019-10-02 NOTE — Patient Care Conference (Signed)
Inpatient RehabilitationTeam Conference and Plan of Care Update Date: 10/02/2019   Time: 1:43 PM    Patient Name: Andrew Perez      Medical Record Number: [id number]  Date of Birth: 08-17-1965 Sex: Male         Room/Bed: 4M03C/4M03C-01 Payor Info: Payor: [employer] / Plan: Designer, jewellery / Product Type: *No Product type* /    Admit Date/Time:  09/27/2019  4:05 PM  Primary Diagnosis:  IVH (intraventricular hemorrhage) ([place])  Patient Active Problem List   Diagnosis Date Noted  . Cerebral aneurysm, R PICA s/p coiling 09/27/2019  . 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 Prolactinoma ([place]) 09/27/2019  . DVT of leg (deep venous thrombosis) ([place]) 09/27/2019  . Obstructive sleep apnea 09/27/2019  . Hypothyroidism 09/27/2019  . ICH (intracerebral hemorrhage) ([place]) 09/27/2019  . Intraventricular hemorrhage ([place]) 09/17/2019  . Hyperlipidemia 07/04/2016  . Hypokalemia 07/04/2016  . Leukocytosis 07/04/2016  . Morbid obesity ([place]) 06/29/2016  . 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 Hypertensive emergency 06/29/2016  . IVH (intraventricular hemorrhage) ([place]) 06/28/2016    Expected Discharge Date: Expected Discharge Date: 10/04/19  Team Members Present: Physician leading conference: Dr. Alysia Penna Care Coodinator Present: Nestor Lewandowsky, RN, BSN, CRRN;Christina Sampson Goon, [place] Nurse Present: Other (comment) ([place]ionne RN) PT Present: Page Spiro, PT OT Present: Clyda Greener, OT SLP Present: Jettie Booze, CF-SLP PPS Coordinator present : Ileana Ladd, PT     Current Status/Progress Goal Weekly Team Focus  Bowel/Bladder   continent of b/b, q2 toileting when awake  Pt will remain continent   Assess pt for BM q shift or as needed   Swallow/Nutrition/ Hydration             ADL's   Supervision for UB bathing and dressing with min assist for LB bathing and mod assist for LB dressing.  supervision  selfcare retraining, balance retraining, neuromuscular re-education,  DME/AE  education, cognitive retraining, therapeutic activities   Mobility   min assist bed mobility, CGA sit<>stands and stand pivot transfers using RW, CGA gait up to 75ft using RW, min assist 4 steps using 1 HR  supervision overall at ambulatory level  activity tolerance, LE strengthening, standing balance, gait training, pt education, transfer training   Communication   Min  Supervision  word finding in abstract conversation, increasing verbal output   Safety/Cognition/ Behavioral Observations  Supervision basic, Min A complex  Supervision complex  complex problem solving, short term recall, emergent awareness, self-monitoring   Pain   No C/O pain atr time of assessment  Pt will remain pain free while at the hospital  Assess pt for pain  or as needed.   Skin   abrasion/sore on bridge of nose due to CPAP mask  Skin will remain intact while at the hospital  Assess skin q shift or as needed    Rehab Goals Patient on target to meet rehab goals: Yes Rehab Goals Revised: patient on target with current goals *See Care Plan and progress notes for long and short-term goals.     Barriers to Discharge  Current Status/Progress Possible Resolutions Date Resolved   Nursing                  PT  Inaccessible home environment;Home environment access/layout;Weight                 OT                  SLP  Care Coordinator Lack of/limited family support 4-5 Steps to enter with right sided railings on target with goals          Discharge Planning/Teaching Needs:  plans to discharge home with spouse  Will schedule if recommended   Team Discussion:  Overall blood pressure is managed. Note some urinary incontinence being addressed with toileting and PVR checks and Urinalysis to r/o cause for incontinence  Revisions to Treatment Plan:      Medical Summary Current Status: Urinary incont, no clinical signs of UTI, morbid obesity Weekly Focus/Goal: Bladder retraining  Barriers to  Discharge: Incontinence;Weight   Possible Resolutions to Barriers: toileting program, check PVRs   Continued Need for Acute Rehabilitation Level of Care: The patient requires daily medical management by a physician with specialized training in physical medicine and rehabilitation for the following reasons: Direction of a multidisciplinary physical rehabilitation program to maximize functional independence : Yes Medical management of patient stability for increased activity during participation in an intensive rehabilitation regime.: Yes Analysis of laboratory values and/or radiology reports with any subsequent need for medication adjustment and/or medical intervention. : Yes   I attest that I was present, lead the team conference, and concur with the assessment and plan of the team.   Dorien Chihuahua B 10/02/2019, 1:43 PM

## 2019-10-02 NOTE — Progress Notes (Signed)
Andrew Perez   Subjective/Complaints:  freq incont episodes, no fever, wife is concenrned about incont episode.  Pt states he cannot hold his urine long enough until nursing can assist him to BR No burning with urination , no prior hx of urinary incont    ROS: Patient denies CP, SOB, N/V/D    Objective:   No results found. Recent Labs    09/30/19 1354  WBC 9.6  HGB 11.9*  HCT 36.2*  PLT 225   Recent Labs    09/30/19 1354  NA 138  K 4.1  CL 100  CO2 29  GLUCOSE 130*  BUN 16  CREATININE 1.03  CALCIUM 9.5    Intake/Output Summary (Last 24 hours) at 10/02/2019 0845 Last data filed at 10/02/2019 0536 Gross per 24 hour  Intake 480 ml  Output 500 ml  Net -20 ml     Physical Exam: Vital Signs Blood pressure 128/66, pulse 63, temperature 98.7 F (37.1 C), resp. rate 19, height 5\' 11"  (1.803 m), weight (!) 199.6 kg, SpO2 96 %.  General: No acute distress Mood and affect are appropriate Heart: Regular rate and rhythm no rubs murmurs or extra sounds Lungs: Clear to auscultation, breathing unlabored, no rales or wheezes Abdomen: Positive bowel sounds, soft nontender to palpation, nondistended Extremities: No clubbing, cyanosis, or edema Skin: No evidence of breakdown, no evidence of rash  Musculoskeletal:  General: 1+ LE edema Cervical back: Normal range of motion. Neurological: He is oriented to person, place, and time. He appearsfairly alert Nocranial nerve deficitor sensory deficit. UE: 3-/5 prox to 4-/5 distally. LE: 3- HF, KE and 3+ ADF/PF. No focal sensory deficits. Reasonable insight and awareness. No dysmetria, good depth perception--neuro exam stable Skin: Abrasion/pressure injury with eschar to bridge of nose--stable   Assessment/Plan: 1. Functional deficits secondary to IVH,cerebellar infarcts which require 3+ hours per day of interdisciplinary therapy in a comprehensive inpatient rehab  setting.  Physiatrist is providing close team supervision and 24 hour management of active medical problems listed below.  Physiatrist and rehab team continue to assess barriers to discharge/monitor patient progress toward functional and medical goals  Care Tool:  Bathing    Body parts bathed by patient: Right arm, Left arm, Chest, Abdomen, Front perineal area, Buttocks, Right upper leg, Left upper leg, Face   Body parts bathed by helper: Right lower leg, Left lower leg     Bathing assist Assist Level: Minimal Assistance - Patient > 75%     Upper Body Dressing/Undressing Upper body dressing   What is the patient wearing?: Pull over shirt    Upper body assist Assist Level: Supervision/Verbal cueing    Lower Body Dressing/Undressing Lower body dressing      What is the patient wearing?: Pants     Lower body assist Assist for lower body dressing: Contact Guard/Touching assist     Toileting Toileting    Toileting assist Assist for toileting: Moderate Assistance - Patient 50 - 74% (for use of urinal) Assistive Device Comment: urinal   Transfers Chair/bed transfer  Transfers assist     Chair/bed transfer assist level: Contact Guard/Touching assist Chair/bed transfer assistive device: Programmer, multimedia   Ambulation assist   Ambulation activity did not occur: Safety/medical concerns (requires use of RW - did not use AD baseline)  Assist level: Contact Guard/Touching assist Assistive device: Walker-rolling Max distance: >100'   Walk 10 feet activity   Assist  Walk 10 feet activity did not  occur: Safety/medical concerns  Assist level: Contact Guard/Touching assist Assistive device: Walker-rolling   Walk 50 feet activity   Assist Walk 50 feet with 2 turns activity did not occur: Safety/medical concerns  Assist level: Contact Guard/Touching assist Assistive device: Walker-rolling    Walk 150 feet activity   Assist Walk 150 feet  activity did not occur: Safety/medical concerns         Walk 10 feet on uneven surface  activity   Assist Walk 10 feet on uneven surfaces activity did not occur: Safety/medical concerns         Wheelchair     Assist Will patient use wheelchair at discharge?: No (TBD)             Wheelchair 50 feet with 2 turns activity    Assist            Wheelchair 150 feet activity     Assist          Blood pressure 128/66, pulse 63, temperature 98.7 F (37.1 C), resp. rate 19, height 5\' 11"  (1.803 m), weight (!) 199.6 kg, SpO2 96 %.  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  -pt had IVCF placed on 6/9.  -antiplatelet therapy:ASA 3. Pain Management:Tylenol prn 4. Mood:LCSW to follow for evaluation and support. -antipsychotic agents:N/A 5. Neuropsych: This patientiscapable of making decisions on hisown behalf. 6. Skin/Wound Care:Routine pressure relief measures. 7. Fluids/Electrolytes/Nutrition: encourage appropriate po  I personally reviewed the patient's labs today.  WNL. 8. HTN: Monitor BP tid--continue catapres TTS-3 patch, Norvasc, Hydralazine, HCTZ, Avapro and aldactone.  Vitals:   10/01/19 2218 10/02/19 0516  BP:  128/66  Pulse:  63  Resp: 18 19  Temp:  98.7 F (37.1 C)  SpO2:  96%  Controlled 6/16 9. 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 10. OSA: Continue CPAP whenever napping and at nights.  -wife to bring home face mask? 11.Hypoxia: Likely  multifactorial- body habitus, intermittent napping, OSA. Encourage IS and wean oxygen to off.  12. Morbid Obesity: BMI-58. Heart Healthy diet.   -dietitian consulted for counseling 13. Bradycardia: Intermittent and asymptomatic. 60's today  14. Hyponatremia: resolved  15. Leucocytosis resolved /ABLA:mild and stable monitor CBC  16. Impaired Fasting Glucose: Hgb A1c- 4.7.  17. Dyslipidemia: On Lipitor.  18.  Urinary incont likely spastic bladder will check UA as well as PVR to check for UTI/retention  Discussion with pt and wife, discussed with nurse manager   LOS: 5 days A FACE TO [place] 10/02/2019, 8:45 AM

## 2019-10-03 ENCOUNTER — Inpatient Hospital Stay (HOSPITAL_COMMUNITY): Payer: 59 | Admitting: Speech Pathology

## 2019-10-03 ENCOUNTER — Inpatient Hospital Stay (HOSPITAL_COMMUNITY): Payer: 59 | Admitting: Occupational Therapy

## 2019-10-03 ENCOUNTER — Inpatient Hospital Stay (HOSPITAL_COMMUNITY): Payer: 59 | Admitting: Physical Therapy

## 2019-10-03 NOTE — Progress Notes (Addendum)
Patient ID: Andrew Perez, male   DOB: 03-Jul-1965, 54 y.o.   MRN: [id number]   Patient set up with [facility] for PCP follow up on Wednesday, June 23 at 9 AM. Please bring insurance cards, photo ID and list of medications

## 2019-10-03 NOTE — Progress Notes (Signed)
Patient ID: Andrew Perez, male   DOB: 1965/12/03, 54 y.o.   MRN: [id number]   patient declined by [facility]

## 2019-10-03 NOTE — Progress Notes (Signed)
Occupational Therapy Discharge Summary  Patient Details  Name: Andrew Perez MRN: [id number] Date of Birth: 1966-01-06  Today's Date: 10/03/2019 OT Individual Time: 1105-1201 OT Individual Time Calculation (min): 56 min   Session Note:  Pt completed shower and dressing with spouse present for education.  She was able to help assist pt with all aspects including transfers as well as LB selfcare as pt needs to sit on the side of the bed to complete donning socks and shoes, and this cannot be completed on his current air bed.  He was able to complete all bathing sit to stand with supervision as well as all functional mobility in the room to the bathroom and the sink at the same level.  He needed only min questioning cueing to complete all bathing.  When asked items he needed for dressing, he was able to state all with min questioning cueing as well.  Educated pt's spouse on the need to be with him during all transfers for safety as well as having him continue to use the walker.  Recommended continued cognitive retraining at home as well with use of puzzles, games, simple home management tasks, making and recalling grocery lists, and working on remembering previous weeks activities. She voiced understanding.  Pt was left in wheelchair at end of session after completion of grooming activities (shaving, brushing his teeth, combing his hair) in standing at the sink.       Patient has met 8 of 8 long term goals due to improved activity tolerance, improved balance, ability to compensate for deficits, improved attention and improved awareness.  Patient to discharge at overall Supervision level.  Patient's care partner is independent to provide the necessary physical and cognitive assistance at discharge.    Reasons goals not met: NA  Recommendation:  Patient will benefit from ongoing skilled OT services in home health setting to continue to advance functional skills in the area of BADL, iADL and Reduce care  partner burden.  Pt has made good improvement with OT but will still benefit from follow-up services to continue progression of ADL and IADL function to an independent/modified independent level.  He continues to still demonstrate memory and awareness deficits as well as decreased dynamic standing balance.    Equipment: No equipment provided  Reasons for discharge: treatment goals met and discharge from hospital  Patient/family agrees with progress made and goals achieved: Yes  OT Discharge Precautions/Restrictions  Precautions Precautions: Fall Precaution Comments: L sided weekness, obese, bilat LE edema-wears compression stocking Restrictions Weight Bearing Restrictions: No   Pain Pain Assessment Pain Scale: 0-10 Pain Score: 0-No pain ADL ADL Eating: Independent Where Assessed-Eating: Chair Grooming: Supervision/safety Where Assessed-Grooming: Standing at sink Upper Body Bathing: Setup Where Assessed-Upper Body Bathing: Shower Lower Body Bathing: Supervision/safety Where Assessed-Lower Body Bathing: Shower Upper Body Dressing: Supervision/safety Where Assessed-Upper Body Dressing: Chair Lower Body Dressing: Supervision/safety Where Assessed-Lower Body Dressing: Edge of bed Toileting: Supervision/safety Where Assessed-Toileting: Glass blower/designer: Close supervision Armed forces technical officer Method: Counselling psychologist: Raised toilet seat Tub/Shower Transfer: Not assessed Social research officer, government: Close supervision Social research officer, government Method: Heritage manager: Civil engineer, contracting with back Vision Baseline Vision/History: Wears glasses Wears Glasses: At all times Patient Visual Report: No change from baseline Eye Alignment: Within Functional Limits Ocular Range of Motion: Within Functional Limits Alignment/Gaze Preference: Within Defined Limits Tracking/Visual Pursuits: Decreased smoothness of vertical tracking;Decreased smoothness of  horizontal tracking Visual Fields: No apparent deficits Additional Comments: Pt with slightly jerky tracking, but able  to follow therapist's finger in all directions.  Noted catch up saccades with VOR cancellation test, but no reports of dizziness with this or with any gaze stabilization which was also normal when tested. Perception  Perception: Within Functional Limits Praxis Praxis: Intact Cognition Overall Cognitive Status: Impaired/Different from baseline Arousal/Alertness: Awake/alert Attention: Focused;Sustained Focused Attention: Appears intact Sustained Attention: Appears intact Memory: Impaired Memory Impairment: Decreased recall of new information;Decreased short term memory Awareness: Impaired Awareness Impairment: Anticipatory impairment Problem Solving: Impaired Problem Solving Impairment: Verbal complex;Functional complex Safety/Judgment: Impaired Comments: Pt noted up in the bathroom without assist earlier today. Sensation Sensation Light Touch: Appears Intact Hot/Cold: Appears Intact Proprioception: Appears Intact Stereognosis: Appears Intact Coordination Gross Motor Movements are Fluid and Coordinated: Yes Fine Motor Movements are Fluid and Coordinated: Yes Coordination and Movement Description: Gross motor and FM coordination intact in BUEs.  Nine hole peg test 36 seconds in both the left and right. Finger Nose Finger Test: WNL bilaterally Motor  Motor Motor - Discharge Observations: generalized weakness still present Mobility  Transfers Sit to Stand: Supervision/Verbal cueing Stand to Sit: Supervision/Verbal cueing  Trunk/Postural Assessment  Cervical Assessment Cervical Assessment: Exceptions to [facility] (forward head) Thoracic Assessment Thoracic Assessment: Exceptions to [facility] (thoracic rounding) Lumbar Assessment Lumbar Assessment: Exceptions to [facility] (lumbar flexion beyond neutral in sitting and standing)  Balance Balance Balance Assessed: Yes Dynamic  Sitting Balance Dynamic Sitting - Level of Assistance: 7: Independent Static Standing Balance Static Standing - Balance Support: During functional activity Static Standing - Level of Assistance: 6: Modified independent (Device/Increase time) Dynamic Standing Balance Dynamic Standing - Balance Support: During functional activity Dynamic Standing - Level of Assistance: 5: Stand by assistance Extremity/Trunk Assessment RUE Assessment RUE Assessment: Within Functional Limits Active Range of Motion (AROM) Comments: WNL General Strength Comments: 5/5 throughout LUE Assessment LUE Assessment: Within Functional Limits General Strength Comments: strength 5/5 throughout   Woodson OTR/L 10/03/2019, 4:56 PM

## 2019-10-03 NOTE — Progress Notes (Signed)
Physical Therapy Discharge Summary  Patient Details  Name: Andrew Perez MRN: [id number] Date of Birth: [birth year]  Today's Date: 10/03/2019 PT Individual Time: 1550-1704 PT Individual Time Calculation (min): 74 min    Patient has met 9 of 9 long term goals due to improved activity tolerance, improved balance, improved postural control, increased strength, ability to compensate for deficits, functional use of  left upper extremity and left lower extremity, improved attention and improved awareness.  Patient to discharge at an ambulatory level using RW with Supervision.   Patient's care partner attended hands-on education/training and is independent to provide the necessary physical and cognitive assistance at discharge.  All goals met.  Recommendation:  Patient will benefit from ongoing skilled PT services in home health setting to continue to advance safe functional mobility, address ongoing impairments in activity tolerance, dynamic standing balance, dynamic gait with LRAD, stair navigation, and minimize fall risk.  Equipment: No equipment provided - pt has all necessary DME  Reasons for discharge: treatment goals met and discharge from hospital  Patient/family agrees with progress made and goals achieved: Yes  Skilled Therapeutic Interventions/Progress Updates:  Pt received sitting in recliner with his wife present for hands-on family education/training. Pt agreeable to therapy session and reports he is eager to discharge home tomorrow. Therapist educated pt's wife on his CLOF and need to use RW for all standing/ambulating tasks as well as recommended follow-up therapy. Pt's wife provided all supervision safely throughout session with education from therapist on how to be positioned during certain mobility tasks such as stairs, ramp, curb step, and car transfer. Sit<>stands using RW with supervision. Gait training ~162f to gym using RW with supervision - demons increased trunk flexion,  slightly too far forward AD management (wife cuing for correction as needed), and decreased gait speed though reciprocal stepping patterns. Simulated ambulatory car transfer (SUV height) using RW with supervision and min cuing from therapist on proper technique - pt did not place feet in/out of car simulator due to short floor boards and pt's body habitus but anticipate will be able to do this with supervision in a real vehicle. Ambulated ~19fup/down ramp using RW with supervision - therapist cuing on how wife can assist with safe AD management down hill if steeper incline. Gait training ~15050fo main therapy gym using RW with supervision - continues with above gait impairments. Ascended/descended 4 steps using R HR per home set-up with pt's wife providing proper close supervision - pt performs lateral step-to pattern using B UE support on R HR as he did prior to hospitalization - pt/wife demonstrate excellent communication during this task to ensure safety of them both without cuing. Educated pt/family on proper sequencing with AD management to step on/off curb step. Pt performed x2 trials using RW demonstrating understanding with pt's wife providing CGA for safety. Therapist educated pt/family on signs/symptoms of stroke, when to call 911, and fall risk education. Therapist provided pt a HEP that included 3 daily walks using RW, repeated sit<>stands 2x8reps, and standing heel raises 2x10 reps - pt performed 1 set of each to demonstrate understanding. Transported back to room in w/c. Stand pivot w/c>recliner using RW with supervision. Pt left seated in recliner with needs in reach, chair alarm on, and pt's wife present.   PT Discharge Precautions/Restrictions Precautions Precautions: Fall Precaution Comments: L sided weekness, obese, bilat LE edema-wears compression stocking Pain Pain Assessment Pain Scale: 0-10 Pain Score: 0-No pain Perception  Perception Perception: Within Functional  Limits Praxis Praxis: Intact  Cognition Overall Cognitive Status: Impaired/Different from baseline Arousal/Alertness: Awake/alert Orientation Level: Oriented X4 Attention: Focused;Sustained Focused Attention: Appears intact Sustained Attention: Appears intact Safety/Judgment: Impaired Comments: Pt noted up in the bathroom without assist earlier today. Sensation Sensation Light Touch: Appears Intact Hot/Cold: Not tested Proprioception: Appears Intact Stereognosis: Not tested Coordination Gross Motor Movements are Fluid and Coordinated: Yes Fine Motor Movements are Fluid and Coordinated: Yes Coordination and Movement Description: Gross motor movements intact in B LEs but modified movement patterns noted to accomodate body habitus Motor  Motor Motor: Other (comment) Motor - Discharge Observations: mild generalized weakness and impaired cardiovascular endurance still present  Mobility Bed Mobility Bed Mobility: Supine to Sit;Sit to Supine Supine to Sit: Supervision/Verbal cueing Sit to Supine: Supervision/Verbal cueing Transfers Transfers: Sit to Stand;Stand to Sit;Stand Pivot Transfers Sit to Stand: Supervision/Verbal cueing Stand to Sit: Supervision/Verbal cueing Stand Pivot Transfers: Supervision/Verbal cueing Transfer (Assistive device): Rolling walker Locomotion  Gait Ambulation: Yes Gait Assistance: Supervision/Verbal cueing Gait Distance (Feet): 150 Feet Assistive device: Rolling walker Gait Gait: Yes Gait Pattern: Impaired Gait Pattern: Wide base of support Gait velocity: decreased though improved from eval Stairs / Additional Locomotion Stairs: Yes Stairs Assistance: Supervision/Verbal cueing Stair Management Technique: One rail Right Number of Stairs: 4 Height of Stairs: 6 Ramp: Supervision/Verbal cueing Curb: Contact Guard/Touching assist Wheelchair Mobility Wheelchair Mobility: No  Trunk/Postural Assessment  Cervical Assessment Cervical Assessment:  Exceptions to [facility] (forward head) Thoracic Assessment Thoracic Assessment: Exceptions to [facility] (thoracic rounding) Lumbar Assessment Lumbar Assessment: Exceptions to [facility] (posterior pelvic tilt) Postural Control Postural Control: Deficits on evaluation Postural Limitations: decreased with pt using B UE support on RW for balance  Balance Balance Balance Assessed: Yes Dynamic Sitting Balance Dynamic Sitting - Level of Assistance: 7: Independent Static Standing Balance Static Standing - Balance Support: During functional activity Static Standing - Level of Assistance: 5: Stand by assistance Dynamic Standing Balance Dynamic Standing - Balance Support: During functional activity;Bilateral upper extremity supported Dynamic Standing - Level of Assistance: 5: Stand by assistance Extremity Assessment      RLE Assessment Active Range of Motion (AROM) Comments: WFL for mobility tasks General Strength Comments: demonstrates grossly 4+/5 to 5/5 during functional mobliity LLE Assessment Active Range of Motion (AROM) Comments: WFL for mobility tasks General Strength Comments: Demonstrates grossly 4+/5 to 5/5 during functional mobility    Tawana Scale, PT, DPT 10/03/2019, 12:28 PM

## 2019-10-03 NOTE — Progress Notes (Signed)
[place] PHYSICAL MEDICINE & REHABILITATION PROGRESS NOTE   Subjective/Complaints:  Pt  Says bladder is better , discussed normal UA, BVI normal as well.  Discussed possible use of medication to slow down bladder .   Discussed normal doppler   ROS: Patient denies CP, SOB, N/V/D    Objective:   VAS [place] LOWER EXTREMITY VENOUS (DVT)  Result Date: 10/02/2019  Lower Venous DVTStudy Indications: Follow up DVT left PTV.  Risk Factors: IV filter. Limitations: Body habitus. Comparison Study: 09/24/19 Performing Technologist: June Leap RDMS, RVT  Examination Guidelines: A complete evaluation includes B-mode imaging, spectral Doppler, color Doppler, and power Doppler as needed of all accessible portions of each vessel. Bilateral testing is considered an integral part of a complete examination. Limited examinations for reoccurring indications may be performed as noted. The reflux portion of the exam is performed with the patient in reverse Trendelenburg.  +---------+---------------+---------+-----------+----------+--------------+ LEFT     CompressibilityPhasicitySpontaneityPropertiesThrombus Aging +---------+---------------+---------+-----------+----------+--------------+ CFV      Full           Yes      Yes                                 +---------+---------------+---------+-----------+----------+--------------+ SFJ      Full                                                        +---------+---------------+---------+-----------+----------+--------------+ FV Prox  Full                                                        +---------+---------------+---------+-----------+----------+--------------+ FV Mid   Full                                                        +---------+---------------+---------+-----------+----------+--------------+ FV DistalFull                                                         +---------+---------------+---------+-----------+----------+--------------+ PFV      Full                                                        +---------+---------------+---------+-----------+----------+--------------+ POP      Full           Yes      Yes                                 +---------+---------------+---------+-----------+----------+--------------+ PTV      Full                                                        +---------+---------------+---------+-----------+----------+--------------+  PERO     Full                                                        +---------+---------------+---------+-----------+----------+--------------+     Summary: LEFT: - There is no evidence of deep vein thrombosis in the lower extremity.  - Technically limited study due to body habitus. Appears previous DVT has resolved.  *See table(s) above for measurements and observations. Electronically signed by Harold Barban MD on 10/02/2019 at 11:23:09 PM.    Final    Recent Labs    09/30/19 1354  WBC 9.6  HGB 11.9*  HCT 36.2*  PLT 225   Recent Labs    09/30/19 1354  NA 138  K 4.1  CL 100  CO2 29  GLUCOSE 130*  BUN 16  CREATININE 1.03  CALCIUM 9.5    Intake/Output Summary (Last 24 hours) at 10/03/2019 0729 Last data filed at 10/03/2019 0441 Gross per 24 hour  Intake 840 ml  Output 900 ml  Net -60 ml     Physical Exam: Vital Signs Blood pressure 133/76, pulse 71, temperature 98.4 F (36.9 C), resp. rate 18, height 5\' 11"  (1.803 m), weight (!) 199.6 kg, SpO2 98 %.  General: No acute distress Mood and affect are appropriate Heart: Regular rate and rhythm no rubs murmurs or extra sounds Lungs: Clear to auscultation, breathing unlabored, no rales or wheezes Abdomen: Positive bowel sounds, soft nontender to palpation, nondistended Extremities: No clubbing, cyanosis, or edema Skin: No evidence of breakdown, no evidence of rash   Musculoskeletal:  General: 1+  LE edema Cervical back: Normal range of motion. Neurological: He is oriented to person, place, and time. He appearsfairly alert Nocranial nerve deficitor sensory deficit. UE: 3-/5 prox to 4-/5 distally. LE: 3- HF, KE and 3+ ADF/PF. No focal sensory deficits. Reasonable insight and awareness. No dysmetria, good depth perception--neuro exam stable Skin: Abrasion/pressure injury with eschar to bridge of nose--stable   Assessment/Plan: 1. 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  Physiatrist is providing close team supervision and 24 hour management of active medical problems listed below.  Physiatrist and rehab team continue to assess barriers to discharge/monitor patient progress toward functional and medical goals  Care Tool:  Bathing    Body parts bathed by patient: Right arm, Left arm, Chest, Abdomen, Front perineal area, Buttocks, Right upper leg, Left upper leg, Face   Body parts bathed by helper: Right lower leg, Left lower leg     Bathing assist Assist Level: Minimal Assistance - Patient > 75%     Upper Body Dressing/Undressing Upper body dressing   What is the patient wearing?: Pull over shirt    Upper body assist Assist Level: Supervision/Verbal cueing    Lower Body Dressing/Undressing Lower body dressing      What is the patient wearing?: Pants     Lower body assist Assist for lower body dressing: Contact Guard/Touching assist     Toileting Toileting    Toileting assist Assist for toileting: Moderate Assistance - Patient 50 - 74% (for use of urinal) Assistive Device Comment: urinal   Transfers Chair/bed transfer  Transfers assist     Chair/bed transfer assist level: Contact Guard/Touching assist Chair/bed transfer assistive device: Other (no AD.)  Locomotion Ambulation   Ambulation assist   Ambulation activity did not  occur: Safety/medical concerns (requires use of RW - did not use AD baseline)  Assist level: Contact Guard/Touching assist Assistive device: Walker-rolling Max distance: 100'   Walk 10 feet activity   Assist  Walk 10 feet activity did not occur: Safety/medical concerns  Assist level: Contact Guard/Touching assist Assistive device: Walker-rolling   Walk 50 feet activity   Assist Walk 50 feet with 2 turns activity did not occur: Safety/medical concerns  Assist level: Contact Guard/Touching assist Assistive device: Walker-rolling    Walk 150 feet activity   Assist Walk 150 feet activity did not occur: Safety/medical concerns         Walk 10 feet on uneven surface  activity   Assist Walk 10 feet on uneven surfaces activity did not occur: Safety/medical concerns         Wheelchair     Assist Will patient use wheelchair at discharge?: No (TBD)             Wheelchair 50 feet with 2 turns activity    Assist            Wheelchair 150 feet activity     Assist          Blood pressure 133/76, pulse 71, temperature 98.4 F (36.9 C), resp. rate 18, height 5\' 11"  (1.803 m), weight (!) 199.6 kg, SpO2 98 %.  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  -pt had IVCF placed on 6/9.  -antiplatelet therapy:ASA 3. Pain Management:Tylenol prn 4. Mood:LCSW to follow for evaluation and support. -antipsychotic agents:N/A 5. Neuropsych: This patientiscapable of making  decisions on hisown behalf. 6. Skin/Wound Care:Routine pressure relief measures. 7. Fluids/Electrolytes/Nutrition: encourage appropriate po  I personally reviewed the patient's labs today.  WNL. 8. HTN: Monitor BP tid--continue catapres TTS-3 patch, Norvasc, Hydralazine, HCTZ, Avapro and aldactone.  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 10. OSA: Continue CPAP whenever napping and at nights.  -wife to bring home face mask? 11.Hypoxia: Likely multifactorial- body habitus, intermittent napping, OSA. Encourage IS and wean oxygen to off.  12. Morbid Obesity: BMI-58. Heart Healthy diet.   -dietitian consulted for counseling 13. Bradycardia: Intermittent and asymptomatic. 60's today  14. Hyponatremia: resolved  15. Leucocytosis resolved /ABLA:mild and stable monitor CBC  16. Impaired Fasting Glucose: Hgb A1c- 4.7.  17. Dyslipidemia: On Lipitor.  18.  Urinary incont likely spastic bladder nl UA , no retention on BVI, anticholinergic meds an option to reduce urgency, pt declines LOS: 6 days A FACE TO FACE EVALUATION WAS PERFORMED  Charlett Blake 10/03/2019, 7:29 AM

## 2019-10-03 NOTE — Progress Notes (Signed)
Speech Language Pathology Discharge Summary  Patient Details  Name: Andrew Perez MRN: [id number] Date of Birth: [birth year]  Today's Date: 10/03/2019 SLP Individual Time: 0915-1010 SLP Individual Time Calculation (min): 55 min   Skilled Therapeutic Interventions:  Pt was seen for skilled ST targeting education with pt and his wife as well as continued treatment toward cognitive goals. SLP facilitated session with review of ST goals and progress, as well as examples of targeted activities throughout admission. Answered questions regarding tasks pt could continue to target at home to promote cognitive re-organization. Reiterated recommendation for 24/7 supervision at home for greatest safety, as well as supervision assist with complex ADLs such as medication and finance management. Verbal review and handout also provided for compensatory strategies for short term memory, many of which pt and his wife state they already use at baseline. Pt and his wife also noted perception that pt is very near cognitive baseline now.  SLP further facilitated session with a semi-complex monthly scheduling task. Pt used compensatory memory aid with Supervision A. Although extra time required for interpretation of more complex language throughout task, pt demonstrated ability to problem solve and detect errors with Supervision A level verbal cues. Pt left sitting in chair with wife present and needs within reach. Continue per current plan of care.   Patient has met 4 of 5 long term goals.  Patient to discharge at overall Supervision;Min level.  Reasons goals not met: Pt requires Min A cues to express more abstract thoughts/language   Clinical Impression/Discharge Summary:   Pt made functional gains and met 4 out of 5 long term goals this admission. Pt currently requires Supervision assist for semi-complex to complex functional tasks due to impairments impacting his emergent awareness, problem solving, and short term  memory. Pt will require 24/7 supervision as a result at discharge. He is expressing his basic wants and needs without difficulty, however requires Mn A semantic cues for word finding when expressing more abstract thoughts in conversation. His overall verbal output has been somewhat limited during sessions, requiring cues for elaboration and clarification of messages as well. Pt has demonstrated improved complex problem solving, self-monitoring and error awareness, as well as use of compensatory strategies for short term recall. However, given cognitive-linguistic deficits still present, recommend pt continue to receive skilled ST services upon discharge. Pt and family education is complete at this time.    Care Partner:  Caregiver Able to Provide Assistance: Yes  Type of Caregiver Assistance: Cognitive  Recommendation:  Outpatient SLP;24 hour supervision/assistance  Rationale for SLP Follow Up: Maximize functional communication;Maximize cognitive function and independence;Reduce caregiver burden   Equipment: none   Reasons for discharge: Discharged from hospital   Patient/Family Agrees with Progress Made and Goals Achieved: Yes    Arbutus Leas 10/03/2019, 7:17 AM

## 2019-10-03 NOTE — Progress Notes (Signed)
Patient ID: Andrew Perez, male   DOB: 10-02-1965, 54 y.o.   MRN: [id number]   Patient decline by [facility]

## 2019-10-04 MED ORDER — IRBESARTAN 150 MG PO TABS: 150.0000 mg | ORAL_TABLET | Freq: Two times a day (BID) | ORAL | 0 refills | Status: DC

## 2019-10-04 MED ORDER — AMLODIPINE BESYLATE 10 MG PO TABS: 10.0000 mg | ORAL_TABLET | Freq: Every day | ORAL | 0 refills | Status: DC

## 2019-10-04 MED ORDER — LEVOTHYROXINE SODIUM 100 MCG PO TABS: 100.0000 ug | ORAL_TABLET | Freq: Every day | ORAL | 0 refills | Status: DC

## 2019-10-04 MED ORDER — PANTOPRAZOLE SODIUM 40 MG PO TBEC: 40.0000 mg | DELAYED_RELEASE_TABLET | Freq: Every day | ORAL | 0 refills | Status: DC

## 2019-10-04 MED ORDER — ATORVASTATIN CALCIUM 40 MG PO TABS: 40.0000 mg | ORAL_TABLET | Freq: Every day | ORAL | 0 refills | Status: DC

## 2019-10-04 MED ORDER — CLONIDINE 0.3 MG/24HR TD PTWK: 0.3000 mg | MEDICATED_PATCH | TRANSDERMAL | 12 refills | Status: AC

## 2019-10-04 MED ORDER — HYDROCHLOROTHIAZIDE 25 MG PO TABS: 25.0000 mg | ORAL_TABLET | Freq: Every day | ORAL | 0 refills | Status: DC

## 2019-10-04 MED ORDER — HYDRALAZINE HCL 100 MG PO TABS: 100.0000 mg | ORAL_TABLET | Freq: Three times a day (TID) | ORAL | 0 refills | Status: DC

## 2019-10-04 MED ORDER — SPIRONOLACTONE 50 MG PO TABS: 50.0000 mg | ORAL_TABLET | Freq: Every day | ORAL | 0 refills | Status: DC

## 2019-10-04 NOTE — Discharge Instructions (Signed)
Inpatient Rehab Discharge Instructions  Andrew Perez Discharge date and time: 10/04/19   Activities/Precautions/ Functional Status: Activity: no lifting, driving, or strenuous exercise  till cleared by MD Diet: cardiac diet Wound Care: none needed   Functional status:  ___ No restrictions     ___ Walk up steps independently _X__ 24/7 supervision/assistance   ___ Walk up steps with assistance ___ Intermittent supervision/assistance  ___ Bathe/dress independently ___ Walk with walker     ___ Bathe/dress with assistance ___ Walk Independently    ___ Shower independently ___ Walk with assistance    _X__ Shower with assistance _X__ No alcohol     ___ Return to work/school ________  Special Instructions: 1. You have a follow-up appointment: [facility] on June 30th at 9:00 AM (Please bring insurance card, medication list and photo ID)    COMMUNITY REFERRALS UPON DISCHARGE:   Outpatient: PT     OT                Agency: [facility] Phone: [phone]   [place] Cigarette smoking nearly doubles your risk of having a stroke & is the single most alterable risk factor  If you smoke or have smoked in the last 12 months, you are advised to quit smoking for your health.  Most of the excess cardiovascular risk related to smoking disappears within a year of stopping.  Ask you doctor about anti-smoking medications  [place] Quit Line: 1-800-QUIT NOW  Free Smoking Cessation Classes [phone]  CHOLESTEROL Know your levels; limit fat & cholesterol in your diet  Lipid Panel     Component Value Date/Time   CHOL 159 09/19/2019 0515   TRIG 100 09/19/2019 0515   HDL 35 (L) 09/19/2019 0515   CHOLHDL 4.5 09/19/2019 0515   VLDL 20 09/19/2019 0515   LDLCALC 104 (H) 09/19/2019 0515      Many patients benefit from treatment even if their cholesterol is at goal.  Goal: Total Cholesterol (CHOL) less than 160  Goal:   Triglycerides (TRIG) less than 150  Goal:  HDL greater than 40  Goal:  LDL (LDLCALC) less than 100   BLOOD PRESSURE American Stroke Association blood pressure target is less that 120/80 mm/Hg  Your discharge blood pressure is:  BP: (!) 150/67  Monitor your blood pressure  Limit your salt and alcohol intake  Many individuals will require more than one medication for high blood pressure  DIABETES (A1c is a blood sugar average for last 3 months) Goal HGBA1c is under 7% (HBGA1c is blood sugar average for last 3 months)  Diabetes: No known diagnosis of diabetes    Lab Results  Component Value Date   HGBA1C 4.7 (L) 09/18/2019     Your HGBA1c can be lowered with medications, healthy diet, and exercise.  Check your blood sugar as directed by your physician  Call your physician if you experience unexplained or low blood sugars.  PHYSICAL ACTIVITY/REHABILITATION Goal is 30 minutes at least 4 days per week  Activity: No driving, Therapies: see above Return to work: N/A  Activity decreases your risk of heart attack and stroke and makes your heart stronger.  It helps control your weight and blood pressure; helps you relax and can improve your mood.  Participate in a regular exercise program.  Talk with your doctor about the best form of exercise for you (dancing, walking, swimming, cycling).  DIET/WEIGHT Goal is to maintain a healthy weight  Your discharge diet is:  Diet Order            Diet Heart Room service appropriate? Yes; Fluid consistency: Thin  Diet effective now                liquids Your height is:  Height: 5\' 11"  (180.3 cm) Your current weight is: Weight: (!) 199.6 kg Your Body Mass Index (BMI) is:  BMI (Calculated): 61.4  Following the type of diet specifically designed for you will help prevent another stroke.  Your goal weight is:  179 lbs  Your goal Body Mass Index (BMI) is 19-24.  Healthy food habits can help reduce 3 risk factors for stroke:  High  cholesterol, hypertension, and excess weight.  RESOURCES Stroke/Support Group:  Call [phone]   STROKE EDUCATION PROVIDED/REVIEWED AND GIVEN TO PATIENT Stroke warning signs and symptoms How to activate emergency medical system (call 911). Medications prescribed at discharge. Need for follow-up after discharge. Personal risk factors for stroke. Pneumonia vaccine given:  Flu vaccine given:  My questions have been answered, the writing is legible, and I understand these instructions.  I will adhere to these goals & educational materials that have been provided to me after my discharge from the hospital.      My questions have been answered and I understand these instructions. I will adhere to these goals and the provided educational materials after my discharge from the hospital.  Patient/Caregiver Signature _______________________________ Date __________  Clinician Signature _______________________________________ Date __________  Please bring this form and your medication list with you to all your follow-up doctor's appointments.

## 2019-10-04 NOTE — Progress Notes (Addendum)
[facility] PHYSICAL MEDICINE & REHABILITATION PROGRESS NOTE   Subjective/Complaints:  No issues overnite, denies incont States bowels are doing ok   ROS: Patient denies CP, SOB, N/V/D    Objective:   VAS [place] LOWER EXTREMITY VENOUS (DVT)  Result Date: 10/02/2019  Lower Venous DVTStudy Indications: Follow up DVT left PTV.  Risk Factors: IV filter. Limitations: Body habitus. Comparison Study: 09/24/19 Performing Technologist: June Leap RDMS, RVT  Examination Guidelines: A complete evaluation includes B-mode imaging, spectral Doppler, color Doppler, and power Doppler as needed of all accessible portions of each vessel. Bilateral testing is considered an integral part of a complete examination. Limited examinations for reoccurring indications may be performed as noted. The reflux portion of the exam is performed with the patient in reverse Trendelenburg.  +---------+---------------+---------+-----------+----------+--------------+ LEFT     CompressibilityPhasicitySpontaneityPropertiesThrombus Aging +---------+---------------+---------+-----------+----------+--------------+ CFV      Full           Yes      Yes                                 +---------+---------------+---------+-----------+----------+--------------+ SFJ      Full                                                        +---------+---------------+---------+-----------+----------+--------------+ FV Prox  Full                                                        +---------+---------------+---------+-----------+----------+--------------+ FV Mid   Full                                                        +---------+---------------+---------+-----------+----------+--------------+ FV DistalFull                                                        +---------+---------------+---------+-----------+----------+--------------+ PFV      Full                                                         +---------+---------------+---------+-----------+----------+--------------+ POP      Full           Yes      Yes                                 +---------+---------------+---------+-----------+----------+--------------+ PTV      Full                                                        +---------+---------------+---------+-----------+----------+--------------+  PERO     Full                                                        +---------+---------------+---------+-----------+----------+--------------+     Summary: LEFT: - There is no evidence of deep vein thrombosis in the lower extremity.  - Technically limited study due to body habitus. Appears previous DVT has resolved.  *See table(s) above for measurements and observations. Electronically signed by Harold Barban MD on 10/02/2019 at 11:23:09 PM.    Final    No results for input(s): WBC, HGB, HCT, PLT in the last 72 hours. No results for input(s): NA, K, CL, CO2, GLUCOSE, BUN, CREATININE, CALCIUM in the last 72 hours.  Intake/Output Summary (Last 24 hours) at 10/04/2019 0737 Last data filed at 10/03/2019 2200 Gross per 24 hour  Intake 480 ml  Output 1250 ml  Net -770 ml     Physical Exam: Vital Signs Blood pressure 140/68, pulse 76, temperature 98 F (36.7 C), temperature source Oral, resp. rate 16, height 5\' 11"  (1.803 m), weight (!) 199.6 kg, SpO2 99 %.  General: No acute distress Mood and affect are appropriate Heart: Regular rate and rhythm no rubs murmurs or extra sounds Lungs: Clear to auscultation, breathing unlabored, no rales or wheezes Abdomen: Positive bowel sounds, soft nontender to palpation, nondistended Extremities: No clubbing, cyanosis, or edema  Musculoskeletal:  General: 1+ LE edema Cervical back: Normal range of motion. Neurological: He is oriented to person, place, and time. He appearsfairly alert Nocranial nerve deficitor sensory deficit. UE: 3-/5 prox to 4-/5 distally. LE: 3-  HF, KE and 3+ ADF/PF. No focal sensory deficits. Reasonable insight and awareness. No dysmetria, good depth perception--neuro exam stable Skin: Abrasion/pressure injury with eschar to bridge of nose--stable   Assessment/Plan: 1. Functional deficits secondary to IVH,cerebellar infarcts  Stable for D/C today F/u PCP in 3-4 weeks F/u PM&R 2 weeks F/u Neuro  See D/C summary See D/C instructions Care Tool:  Bathing    Body parts bathed by patient: Right arm, Left arm, Chest, Abdomen, Front perineal area, Buttocks, Right upper leg, Left upper leg, Face, Right lower leg, Left lower leg   Body parts bathed by helper: Right lower leg, Left lower leg     Bathing assist Assist Level: Supervision/Verbal cueing     Upper Body Dressing/Undressing Upper body dressing   What is the patient wearing?: Pull over shirt    Upper body assist Assist Level: Set up assist    Lower Body Dressing/Undressing Lower body dressing      What is the patient wearing?: Pants, Underwear/pull up     Lower body assist Assist for lower body dressing: Supervision/Verbal cueing     Toileting Toileting    Toileting assist Assist for toileting: Supervision/Verbal cueing Assistive Device Comment: Urinal   Transfers Chair/bed transfer  Transfers assist     Chair/bed transfer assist level: Supervision/Verbal cueing Chair/bed transfer assistive device: Programmer, multimedia   Ambulation assist   Ambulation activity did not occur: Safety/medical concerns (requires use of RW - did not use AD baseline)  Assist level: Supervision/Verbal cueing Assistive device: Walker-rolling Max distance: 113ft   Walk 10 feet activity   Assist  Walk 10 feet activity did not occur: Safety/medical concerns  Assist level: Supervision/Verbal cueing Assistive device: Walker-rolling  Walk 50 feet activity   Assist Walk 50 feet with 2 turns activity did not occur: Safety/medical concerns  Assist  level: Supervision/Verbal cueing Assistive device: Walker-rolling    Walk 150 feet activity   Assist Walk 150 feet activity did not occur: Safety/medical concerns  Assist level: Supervision/Verbal cueing Assistive device: Walker-rolling    Walk 10 feet on uneven surface  activity   Assist Walk 10 feet on uneven surfaces activity did not occur: Safety/medical concerns   Assist level: Supervision/Verbal cueing (ramp) Assistive device: Aeronautical engineer Will patient use wheelchair at discharge?: No             Wheelchair 50 feet with 2 turns activity    Assist            Wheelchair 150 feet activity     Assist          Blood pressure 140/68, pulse 76, temperature 98 F (36.7 C), temperature source Oral, resp. rate 16, height 5\' 11"  (1.803 m), weight (!) 199.6 kg, SpO2 99 %.  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  -pt had IVCF placed on 6/9.  -antiplatelet therapy:ASA 3. Pain Management:Tylenol prn 4. Mood:LCSW to follow for evaluation and support. -antipsychotic agents:N/A 5. Neuropsych: This patientiscapable of making decisions on hisown behalf. 6. Skin/Wound Care:Routine pressure relief measures. 7. Fluids/Electrolytes/Nutrition: encourage appropriate po  I personally reviewed the patient's labs today.  WNL. 8. HTN: Monitor BP tid--continue catapres TTS-3 patch, Norvasc, Hydralazine, HCTZ, Avapro and aldactone.  Vitals:   10/03/19 1936 10/04/19 0549   BP: (!) 122/54 140/68  Pulse: 60 76  Resp: 18 16  Temp: 97.6 F (36.4 C) 98 F (36.7 C)  SpO2: 99% 99%  Controlled 6/18 9. 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 10. OSA: Continue CPAP whenever napping and at nights.  -wife to bring home face mask? 11.Hypoxia: Likely multifactorial- body habitus, intermittent napping, OSA. Encourage IS and wean oxygen to off.  12. Morbid Obesity: BMI-58. Heart Healthy diet.   -dietitian consulted for counseling 13. Bradycardia: Intermittent and asymptomatic. 60's today  14. Hyponatremia: resolved  15. Leucocytosis resolved /ABLA:mild and stable monitor CBC  16. Impaired Fasting Glucose: Hgb A1c- 4.7.  17. Dyslipidemia: On Lipitor.  18.  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

## 2019-10-04 NOTE — Progress Notes (Signed)
Patient left with his wife and was given all discharge instructions along with his belongings.  Patient and wife both verbalized understanding of instructions.  All medications sent to the pharmacy.  No obvious acute distress or discomfort noted.

## 2019-10-04 NOTE — Discharge Summary (Signed)
Physician Discharge Summary  Patient ID: Andrew Perez MRN: [id number] DOB/AGE: [birth year] 54 y.o.  Admit date: 09/27/2019 Discharge date: 10/04/2019  Discharge Diagnoses:  Principal Problem:   IVH (intraventricular hemorrhage) (HCC) Active Problems:   Prolactinoma (HCC)   DVT of leg (deep venous thrombosis) (HCC)   Obstructive sleep apnea   ICH (intracerebral hemorrhage) (HCC)   Impaired fasting glucose   Presence of IVC filter   Essential hypertension   Discharged Condition: stable   Significant Diagnostic Studies: N/A   Labs:  Basic Metabolic Panel: BMP Latest Ref Rng & Units 09/30/2019 09/28/2019 09/23/2019  Glucose 70 - 99 mg/dL 130(H) 105(H) 115(H)  BUN 6 - 20 mg/dL 16 16 17   Creatinine 0.61 - 1.24 mg/dL 1.03 0.76 0.80  Sodium 135 - 145 mmol/L 138 140 131(L)  Potassium 3.5 - 5.1 mmol/L 4.1 3.5 4.1  Chloride 98 - 111 mmol/L 100 99 96(L)  CO2 22 - 32 mmol/L 29 31 25   Calcium 8.9 - 10.3 mg/dL 9.5 9.1 8.8(L)    CBC: CBC Latest Ref Rng & Units 09/30/2019 09/23/2019 09/22/2019  WBC 4.0 - 10.5 K/uL 9.6 11.5(H) 13.5(H)  Hemoglobin 13.0 - 17.0 g/dL 11.9(L) 12.2(L) 13.3  Hematocrit 39 - 52 % 36.2(L) 35.6(L) 39.3  Platelets 150 - 400 K/uL 225 225 268    CBG: No results for input(s): GLUCAP in the last 168 hours.  Brief HPI:   Andrew Perez is a 54 y.o. male with history of morbid obesity-BMI 67, IVH 2018, OSA, prolactinoma who was admitted on 09/17/2019 with acute onset of headaches with dizziness and episode of vomiting.  He was hypotensive at admission was found to have small acute IVH involving fourth ventricle.  Bleed was felt to be hypertensive in nature and he was started on Cleviprex for BP control.  Dr. Ronnald Ramp was consulted for input and CTA was recommended for work-up.  CT angio showed distal 5 mm dissecting the right-PICA aneurysm and patient underwent embolization of aneurysm on 06/03 by  Dr. Katherina Right Roderigues.  Postprocedure he had nausea and vomiting with repeat CT  head showing right greater than left cerebellar infarct.    Follow up MRI brain showed evolving infarct similar in extension compared to prior CT.  Patient was started on aspirin for secondary stroke prevention. He continued to have fluctuating mental status and repeat CT head 06/08 showed evolving nonhemorrhagic cerebellar infarcts with progressive mass-effect and slight increase in size of frontal horn of lateral ventricles.  BLE Dopplers were done due to lower extremity edema and showed DVT in left posterior tibial vein.  IVC filter was placed by radiology on 06/09 as patient not felt to be a candidate for anticoagulation.  Lethargy was resolving however patient continued to be limited by left-sided weakness with unsteady gait.  CIR was recommended due to functional decline.   Hospital Course: Andrew Perez was admitted to rehab 09/27/2019 for inpatient therapies to consist of PT, ST and OT at least three hours five days a week. Past admission physiatrist, therapy team and rehab RN have worked together to provide customized collaborative inpatient rehab. Blood pressures were monitored on TID basis and has been controlled on current regimen.  Follow-up labs showed impaired fasting glucose his hemoglobin A1c within normal limits.  Wife was instructed to bring his face mask and he has been compliant with CPAP use. Follow up CBC showed mild drop in H/H and reactive leucocytosis has resolved. UA ordered for work up on 06/16 and was negative.  Urine culture showed morganella but patient has been asymptomatic and this was felt to be a contaminant.  He has made steady progress during his rehab stay and is currently at supervision level.  He will continue to receive further follow-up outpatient PT and OT at [facility] outpatient rehab after discharge.   Rehab course: During patient's stay in rehab team conference was held to monitor patient's progress, set goals and discuss barriers to discharge. At admission, patient  required min assist with mobility and min to mod assist with ADL tasks. He demonstrated mild to moderate cognitive deficits. He has had improvement in activity tolerance, balance, postural control as well as ability to compensate for deficits.  He is able to complete ADL tasks with cues and supervision.  He requires supervision for transfers and to ambulate 150 feet with rolling walker.  He requires supervision for semicomplex complex functional task and min assist for word finding deficits affecting more abstract thoughts.  Family education was completed with wife regarding all aspects of care and safety.  Discharge disposition: 01-Home or Self Care  Diet:  Cardiac diet.   Special Instructions: 1. No driving or strenuous activity till cleared by MD. 2. Follow up appointment with [facility] on June 30th at 9 am.    Discharge Instructions    Ambulatory referral to Physical Medicine Rehab   Complete by: As directed    1-2 weeks TC appointment     Allergies as of 10/04/2019      Reactions   Codeine Shortness Of Breath   Shellfish Allergy Shortness Of Breath      Medication List    STOP taking these medications   Chlorhexidine Gluconate Cloth 2 % Pads   iohexol 240 MG/ML injection Commonly known as: OMNIPAQUE   labetalol 5 MG/ML injection Commonly known as: NORMODYNE     TAKE these medications   acetaminophen 325 MG tablet Commonly known as: TYLENOL Take 1-2 tablets (325-650 mg total) by mouth every 4 (four) hours as needed for mild pain.   amLODipine 10 MG tablet Commonly known as: NORVASC Take 1 tablet (10 mg total) by mouth daily.   aspirin 325 MG tablet Take 1 tablet (325 mg total) by mouth daily.   atorvastatin 40 MG tablet Commonly known as: LIPITOR Take 1 tablet (40 mg total) by mouth daily after supper. What changed: when to take this   cabergoline 0.5 MG tablet Commonly known as: DOSTINEX Take 0.5 mg by mouth 2 (two) times a week. Saturday and  wednesday   cloNIDine 0.3 mg/24hr patch Commonly known as: CATAPRES - Dosed in mg/24 hr Place 1 patch (0.3 mg total) onto the skin every 7 (seven) days. Change every Saturday What changed: additional instructions   hydrALAZINE 100 MG tablet Commonly known as: APRESOLINE Take 1 tablet (100 mg total) by mouth 3 (three) times daily. What changed: Another medication with the same name was removed. Continue taking this medication, and follow the directions you see here.   hydrochlorothiazide 25 MG tablet Commonly known as: HYDRODIURIL Take 1 tablet (25 mg total) by mouth daily.   irbesartan 150 MG tablet Commonly known as: AVAPRO Take 1 tablet (150 mg total) by mouth 2 (two) times daily.   levothyroxine 100 MCG tablet Commonly known as: SYNTHROID Take 1 tablet (100 mcg total) by mouth daily.   pantoprazole 40 MG tablet Commonly known as: PROTONIX Take 1 tablet (40 mg total) by mouth daily.   polyethylene glycol 17 g packet Commonly known as: MIRALAX /  GLYCOLAX Take 17 g by mouth daily.   senna-docusate 8.6-50 MG tablet Commonly known as: Senokot-S Take 1 tablet by mouth 2 (two) times daily.   spironolactone 50 MG tablet Commonly known as: ALDACTONE Take 1 tablet (50 mg total) by mouth daily.       Follow-up Information    Kirsteins, Luanna Salk, MD Follow up.   Specialty: Physical Medicine and Rehabilitation Why: Office will call you with follow up appointment Contact information: [place] [address] [phone]        [facility]. Call on 10/07/2019.   Why: for post stroke follow up Contact information: [address]     [place] [address] [phone]              Signed: Bary Perez 10/10/2019, 5:06 PM

## 2019-10-04 NOTE — Progress Notes (Signed)
RT came to place patient on CPAP HS. Patient is already on CPAP HS. Patient tolerating well.

## 2019-10-04 NOTE — Progress Notes (Signed)
Inpatient Rehabilitation Care Coordinator  Discharge Note  The overall goal for the admission was met for:   Discharge location: Yes, home  Length of Stay: Yes,7 Days  Discharge activity level: Yes, Supervision  Home/community participation: Yes  Services provided included: MD, RD, PT, OT, SLP, RN, CM, TR, Pharmacy, Neuropsych and SW  Financial Services: Private Insurance: [employer]  Follow-up services arranged: Outpatient: [facility]  Comments (or additional information): PT OT  NO DME NEEDS  Patient/Family verbalized understanding of follow-up arrangements: Yes  Individual responsible for coordination of the follow-up plan: Seth Bake, [phone]  Confirmed correct DME delivered: Dyanne Iha 10/04/2019    Dyanne Iha

## 2019-10-04 NOTE — Progress Notes (Signed)
Patient ID: Andrew Perez, male   DOB: 1966-01-16, 54 y.o.   MRN: [id number]   Orders faxed to [facility]

## 2019-10-05 LAB — URINE CULTURE: Culture: >=100000 — AB

## 2019-10-07 NOTE — Progress Notes (Signed)
UA neg, + Ucx, asymptomatic except for incont in hospital, this is consistent with colonization and do not rec  tx unless symptoms occur

## 2019-10-10 DIAGNOSIS — R7301 Impaired fasting glucose: Secondary | ICD-10-CM

## 2019-10-10 DIAGNOSIS — Z95828 Presence of other vascular implants and grafts: Secondary | ICD-10-CM

## 2019-10-10 DIAGNOSIS — I10 Essential (primary) hypertension: Secondary | ICD-10-CM

## 2019-10-23 DIAGNOSIS — R55 Syncope and collapse: Secondary | ICD-10-CM | POA: Diagnosis not present

## 2019-10-24 ENCOUNTER — Encounter: Payer: 59 | Admitting: Physical Medicine & Rehabilitation

## 2019-11-04 ENCOUNTER — Other Ambulatory Visit: Payer: Self-pay

## 2019-11-04 ENCOUNTER — Ambulatory Visit: Payer: 59 | Admitting: Adult Health

## 2019-11-04 ENCOUNTER — Encounter: Payer: Self-pay | Admitting: Adult Health

## 2019-11-04 ENCOUNTER — Telehealth: Payer: Self-pay | Admitting: Adult Health

## 2019-11-04 VITALS — BP 140/79 | HR 73 | Ht 71.0 in | Wt 382.0 lb

## 2019-11-04 DIAGNOSIS — I6350 Cerebral infarction due to unspecified occlusion or stenosis of unspecified cerebral artery: Secondary | ICD-10-CM

## 2019-11-04 DIAGNOSIS — I615 Nontraumatic intracerebral hemorrhage, intraventricular: Secondary | ICD-10-CM | POA: Diagnosis not present

## 2019-11-04 DIAGNOSIS — I69398 Other sequelae of cerebral infarction: Secondary | ICD-10-CM

## 2019-11-04 DIAGNOSIS — I671 Cerebral aneurysm, nonruptured: Secondary | ICD-10-CM

## 2019-11-04 DIAGNOSIS — E782 Mixed hyperlipidemia: Secondary | ICD-10-CM

## 2019-11-04 DIAGNOSIS — R569 Unspecified convulsions: Secondary | ICD-10-CM

## 2019-11-04 DIAGNOSIS — Z95828 Presence of other vascular implants and grafts: Secondary | ICD-10-CM

## 2019-11-04 DIAGNOSIS — I10 Essential (primary) hypertension: Secondary | ICD-10-CM

## 2019-11-04 DIAGNOSIS — I69319 Unspecified symptoms and signs involving cognitive functions following cerebral infarction: Secondary | ICD-10-CM

## 2019-11-04 DIAGNOSIS — R1311 Dysphagia, oral phase: Secondary | ICD-10-CM

## 2019-11-04 MED ORDER — LEVETIRACETAM 500 MG PO TABS: 500.0000 mg | ORAL_TABLET | Freq: Two times a day (BID) | ORAL | 3 refills | Status: DC

## 2019-11-04 NOTE — Progress Notes (Signed)
[facility] [address] [place]. [place] [address] [phone]       HOSPITAL FOLLOW UP NOTE  Andrew Perez Date of Birth:  May 24, 1965 Medical Record Number:  [id number]   Reason for Referral:  hospital stroke follow up    SUBJECTIVE:   CHIEF COMPLAINT:  Chief Complaint  Patient presents with  . Follow-up    rm 9 here for a f/u on a stoke. 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 SBP > 190 upon arrival.  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  Underwent right PICA aneurysm embolization with sacrifice of PICA segment.  Initiated aspirin 325 mg daily for stroke prevention.  Evidence of DVT left posterior tibial vein and placement of IVC filter as he is not anticoagulant candidate given IVH.  History of HTN found in hypertensive emergency with adjustments to antihypertensive regimen and long-term BP goal normotensive range.  LDL 104 initiate atorvastatin 40 mg daily.  Other stroke risk factors include morbid obesity, prolactinoma and OSA.  Evaluated by therapies and recommended discharge to CIR for ongoing therapy needs.  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 small volume IVH in 4th ventricle. Interval resection previous central skull base infiltrative mass. 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 No hydrocephalus. Known sellar mass w/ skull base erosion.   Cerebral angio right PICA 45mm aneurysm   MRI -Evolving bilateral PICA territory infarct, similar in extension to prior CT. There is no hydrocephalus.   Repeat CT head 6/8 /21 evolving nonhemorrhagic cerebral infarcts right greater than left   2D EchoEF 55-60%. No source of embolus. LA moderate dilated  LDL104  HgbA1c4.7  UDS + opiates  SCDs for VTE prophylaxis  No antithromboticprior to admission, was on No antithromboticgiven hemorrhage. Post aneurysm embolization, now off heparin IV, on ASA 325. Will not use DAPT given hemorrhage   Therapy recommendations: CIR  Disposition: CIR  Today, 11/04/2019, [name] is being seen for hospital follow-up accompanied by his wife.  Discharged home from [place] on 10/04/2019 with uneventful stay.  Residual deficits of cognitive impairment and imbalance as well as occasional difficulty swallowing chewy or hard foods.  He is frustrated regarding continued cognitive impairment and lack of improvement.  Initially participating in outpatient therapy at [facility] but placed on hold due to seizure type event during therapy on 10/25/2019 evaluated at [facility] ED.  Event consisted of generalized feeling of not feeling well and became diaphoretic and then progressed to staring and unresponsive and then lost consciousness for approximately 1 minute.  No postictal state per wife.  Unable to view hospital notes via epic.  Initiated Keppra 500 mg twice daily without recurrent seizure type activity.  Majority of antihypertensives discontinued except for clonidine.  Per wife, history of episodes of not feeling well and becoming diaphoretic but never further progress  with typical trigger of increased heat.  Does complain of diarrhea since starting with needs of daily Imodium.  Unsure if diarrhea has improved as they have not trialed holding Imodium.  Plans on restarting outpatient  therapy tomorrow -wife is unsure if he is participating in speech therapy.  Ambulates with rolling walker and denies any recent falls.  Denies new or worsening stroke/TIA symptoms.  Continues on aspirin 325 mg daily and atorvastatin 40 mg daily without side effects.  Blood pressure today 140/79.  Recent follow-up with endocrinology and has not had follow-up with neurosurgery since discharge.  No further concerns at this time.     ROS:   14 system review of systems performed and negative with exception of memory loss, gait impairment, seizures  PMH:  Past Medical History:  Diagnosis Date  . Hypertension   . IVH (intraventricular hemorrhage) ([place]) 2018   due to HTN  . Prolactinoma ([place])    followed by [facility]  . Sleep apnea     PSH:  Past Surgical History:  Procedure Laterality Date  . IR 3D INDEPENDENT WKST  09/19/2019  . IR ANGIO INTRA EXTRACRAN SEL COM CAROTID INNOMINATE BILAT MOD SED  09/18/2019  . IR ANGIO VERTEBRAL SEL SUBCLAVIAN INNOMINATE UNI L MOD SED  09/18/2019  . IR ANGIO VERTEBRAL SEL VERTEBRAL UNI R MOD SED  09/18/2019  . IR ANGIO VERTEBRAL SEL VERTEBRAL UNI R MOD SED  09/19/2019  . IR ANGIOGRAM FOLLOW UP STUDY  09/19/2019  . IR CT HEAD LTD  09/19/2019  . IR IVC FILTER PLMT / S&I /IMG GUID/MOD SED  09/25/2019  . IR NEURO EACH ADD'L AFTER BASIC UNI RIGHT (MS)  09/19/2019  . IR TRANSCATH/EMBOLIZ  09/19/2019  . IR US GUIDE VASC ACCESS RIGHT  09/18/2019  . IR US GUIDE VASC ACCESS RIGHT  09/19/2019  . RADIOLOGY WITH ANESTHESIA N/A 09/19/2019   Procedure: RADIOLOGY WITH ANESTHESIA ANUERYSM COILING;  Surgeon: Pedro Earls, MD;  Location: [facility];  Service: Radiology;  Laterality: N/A;  . TONSILLECTOMY      Social History:  Social History   Socioeconomic History  . Marital status: Married    Spouse name: Not on file  . Number of children: Not on file  . Years of education: Not on file  . Highest education level: Not on file  Occupational History  . Not on file  Tobacco  Use  . Smoking status: Never Smoker  . Smokeless tobacco: Never Used  Substance and Sexual Activity  . Alcohol use: No  . Drug use: No  . Sexual activity: Not on file  Other Topics Concern  . Not on file  Social History Narrative  . Not on file   Social Determinants of Health   Financial Resource Strain:   . Difficulty of Paying Living Expenses:   Food Insecurity:   . Worried About Charity fundraiser in the Last Year:   . Arboriculturist in the Last Year:   Transportation Needs:   . Film/video editor (Medical):   Marland Kitchen Lack of Transportation (Non-Medical):   Physical Activity:   . Days of Exercise per Week:   . Minutes of Exercise per Session:   Stress:   . Feeling of Stress :   Social Connections:   . Frequency of Communication with Friends and Family:   . Frequency of Social Gatherings with Friends and Family:   . Attends Religious Services:   . Active Member of Clubs or Organizations:   .  Attends Archivist Meetings:   Marland Kitchen Marital Status:   Intimate Partner Violence:   . Fear of Current or Ex-Partner:   . Emotionally Abused:   Marland Kitchen Physically Abused:   . Sexually Abused:     Family History:  Family History  Problem Relation Age of Onset  . Colon cancer Father     Medications:   Current Outpatient Medications on File Prior to Visit  Medication Sig Dispense Refill  . aspirin 325 MG tablet Take 1 tablet (325 mg total) by mouth daily.    Marland Kitchen atorvastatin (LIPITOR) 40 MG tablet Take 1 tablet (40 mg total) by mouth daily after supper. 30 tablet 0  . cabergoline (DOSTINEX) 0.5 MG tablet Take 0.5 mg by mouth 2 (two) times a week. Saturday and wednesday  0  . cloNIDine (CATAPRES - DOSED IN MG/24 HR) 0.3 mg/24hr patch Place 1 patch (0.3 mg total) onto the skin every 7 (seven) days. Change every Saturday 4 patch 12  . levothyroxine (SYNTHROID) 100 MCG tablet Take 1 tablet (100 mcg total) by mouth daily. 30 tablet 0  . acetaminophen (TYLENOL) 325 MG tablet Take 1-2  tablets (325-650 mg total) by mouth every 4 (four) hours as needed for mild pain. (Patient not taking: Reported on 11/04/2019)    . amLODipine (NORVASC) 10 MG tablet Take 1 tablet (10 mg total) by mouth daily. (Patient not taking: Reported on 11/04/2019) 30 tablet 0  . hydrALAZINE (APRESOLINE) 100 MG tablet Take 1 tablet (100 mg total) by mouth 3 (three) times daily. (Patient not taking: Reported on 11/04/2019) 90 tablet 0  . hydrochlorothiazide (HYDRODIURIL) 25 MG tablet Take 1 tablet (25 mg total) by mouth daily. (Patient not taking: Reported on 11/04/2019) 30 tablet 0  . irbesartan (AVAPRO) 150 MG tablet Take 1 tablet (150 mg total) by mouth 2 (two) times daily. (Patient not taking: Reported on 11/04/2019) 60 tablet 0  . pantoprazole (PROTONIX) 40 MG tablet Take 1 tablet (40 mg total) by mouth daily. (Patient not taking: Reported on 11/04/2019) 30 tablet 0  . polyethylene glycol (MIRALAX / GLYCOLAX) 17 g packet Take 17 g by mouth daily. (Patient not taking: Reported on 11/04/2019) 14 each 0  . senna-docusate (SENOKOT-S) 8.6-50 MG tablet Take 1 tablet by mouth 2 (two) times daily. (Patient not taking: Reported on 11/04/2019)    . spironolactone (ALDACTONE) 50 MG tablet Take 1 tablet (50 mg total) by mouth daily. (Patient not taking: Reported on 11/04/2019) 30 tablet 0   No current facility-administered medications on file prior to visit.    Allergies:   Allergies  Allergen Reactions  . Codeine Shortness Of Breath  . Shellfish Allergy Shortness Of Breath      OBJECTIVE:  Physical Exam  Vitals:   11/04/19 1346  BP: 140/79  Pulse: 73  Weight: (!) 382 lb (173.3 kg)  Height: 5\' 11"  (1.803 m)   Body mass index is 53.28 kg/m. No exam data present  No flowsheet data found.   General: Morbidly obese flat affect but pleasant middle-age Caucasian male, seated, in no evident distress Head: head normocephalic and atraumatic.   Neck: supple with no carotid or supraclavicular  bruits Cardiovascular: regular rate and rhythm, no murmurs Musculoskeletal: no deformity Skin:  no rash/petichiae Vascular:  Normal pulses all extremities   Neurologic Exam Mental Status: Awake and fully alert.   Fluent speech and language.  Oriented to place and time. Recent memory impaired and remote memory intact. Attention span, concentration and fund of knowledge  appropriate during visit. Mood and affect flat. Recall: 1/3. serial addition: poor. 4 legged Animal naming: 5 in 60 seconds.  Cranial Nerves: Fundoscopic exam reveals sharp disc margins. Pupils equal, briskly reactive to light. Extraocular movements full without nystagmus. Visual fields full to confrontation. Hearing intact. Facial sensation intact. Face, tongue, palate moves normally and symmetrically.  Motor: Normal bulk and tone. Normal strength in all tested extremity muscles. Sensory.: intact to touch , pinprick , position and vibratory sensation.  Coordination: Rapid alternating movements normal in all extremities. Finger-to-nose performed accurately bilaterally and heel-to-shin difficulty performing Gait and Station: Arises from chair without difficulty. Stance is normal. Gait demonstrates  decrease stride length and slow gait speed with use of rolling walker and mild imbalance.  Tandem walk not attempted. Reflexes: 1+ and symmetric. Toes downgoing.     NIHSS  0 Modified Rankin  3      ASSESSMENT/PLAN: Andrew Perez is a 54 y.o. year old male presented with dizziness with episode of vomiting followed by headache and hypertensive urgency on 09/17/2019 with stroke work-up revealing bilateral PICA territory infarcts and small fourth ventricle IVH in setting of dissecting right PICA aneurysm and hypertensive emergency. S/p R PICA aneurysm embolization with sacrifice of the right PICA at telovelotonsillar segment 6/3.  Seizure-like episode 10/25/2019 evaluated at [facility].  Vascular risk factors include HTN, HLD, IVH  2018, prolactinoma dx 06/2016, morbid obesity, acute DVT s/p IVC filter and OSA on CPAP.     1. B PICA stroke and 4th ventricle IVH:  -Residual deficits: Cognitive impairment and gait impairment and subjective dysphagia.  Continue outpatient therapies at [facility].  Wife reports speech therapy was not ordered and requests order to be sent to [facility] for cognitive impairment and evaluation of dysphagia. -Continue aspirin 325 mg daily  and atorvastatin for secondary stroke prevention.  -Maintain strict control of hypertension with blood pressure goal below 130/90, diabetes with hemoglobin A1c goal below 6.5% and cholesterol with LDL cholesterol (bad cholesterol) goal below 70 mg/dL.  I also advised the patient to eat a healthy diet with plenty of whole grains, cereals, fruits and vegetables, exercise regularly with at least 30 minutes of continuous activity daily and maintain ideal body weight. 2. Seizure-like episode: Likely secondary to recent stroke and IVH as well as known prolactinoma.  Will request [facility] records for further review.  Continue Keppra 500 mg twice daily for seizure prophylaxis -refill provided 3. Dissecting R PICA aneurysm s/p embolization: Recommended follow-up by NIR with Dr. Karenann Cai 3 months post procedure approximately around 12/2019 4. LLE acute DVT s/p IVC filter: Not a candidate for anticoagulation.  Continue to follow with NIR for monitoring management 5. HTN: Stable.  Advised routine monitoring at home and ongoing follow-up with PCP for monitoring and management 6. HLD: Continue atorvastatin and continue to follow with PCP for prescribing, monitoring and management 7. Prolactinoma: Continue to follow with [facility] endocrinology and neurosurgery for routine monitoring and management    Follow up in 3 months or call earlier if needed   I spent 50 minutes of face-to-face and non-face-to-face time with patient and wife.  This  included previsit chart review, lab review, study review, order entry, electronic health record documentation, patient education regarding recent stroke, residual deficits, importance of managing stroke risk factors and answered all questions to patient satisfaction   Frann Rider, [facility]  [facility] [address] [place], [place] [address]  Phone [phone] Fax [phone] Note: This  document was prepared with digital dictation and possible smart phrase technology. Any transcriptional errors that result from this process are unintentional.

## 2019-11-04 NOTE — Addendum Note (Signed)
Addended by: Mal Misty on: 11/04/2019 06:06 PM   Modules accepted: Orders

## 2019-11-04 NOTE — Patient Instructions (Signed)
Restart physical, occupational and speech therapy for ongoing symptoms with likely further improvement  Continue keppra 500mg  twice daily for seizure prevention - refill provided   Continue aspirin 81 mg daily  and lipitor  for secondary stroke prevention  Continue to follow up with PCP regarding cholesterol and blood pressure management  Routine follow up with endocrinology and neurosurgery routinely as scheduled    Continue to monitor blood pressure at home   Maintain strict control of hypertension with blood pressure goal below 130/90, diabetes with hemoglobin A1c goal below 6.5% and cholesterol with LDL cholesterol (bad cholesterol) goal below 70 mg/dL. I also advised the patient to eat a healthy diet with plenty of whole grains, cereals, fruits and vegetables, exercise regularly and maintain ideal body weight.  Followup in the future with me in 3 months or call earlier if needed       Thank you for coming to see [place] at [facility]. I hope we have been able to provide you high quality care today.  You may receive a patient satisfaction survey over the next few weeks. We would appreciate your feedback and comments so that we may continue to improve ourselves and the health of our patients.

## 2019-11-04 NOTE — Telephone Encounter (Signed)
Pt's wife(on DPR-Voorhies,Andrea) is asking for a call to inform there is no order for the Speech Therapy but they want it to go to [facility], the office phone# [phone] (wife did not have fax#)

## 2019-11-05 ENCOUNTER — Telehealth: Payer: Self-pay | Admitting: Adult Health

## 2019-11-05 DIAGNOSIS — I69398 Other sequelae of cerebral infarction: Secondary | ICD-10-CM

## 2019-11-05 MED ORDER — LEVETIRACETAM 750 MG PO TABS: 750.0000 mg | ORAL_TABLET | Freq: Two times a day (BID) | ORAL | 3 refills | Status: DC

## 2019-11-05 NOTE — Telephone Encounter (Signed)
Were seizures consisting of similar symptoms including starring off, unresponsive, and LOC? If so, would recommend taking an additional dose of keppra today and increase dose to 750mg  twice daily. If recurrent seizures today or last greater than 5 minutes, recommend proceeding to ED

## 2019-11-05 NOTE — Telephone Encounter (Signed)
I called wife.  Pt has had 2 seizures, at therapy, once in waiting room lasted for a minute, pt c/o nausea, eyelash fluttering then fixed stare, clammy afterwards (nonresponsive), the when having OT, 15 minutes later,  therapist noted this as well, another sz same presentation.  ? Trigger car motions lights??   Taking keppra 500mg  po bid.  [facility] hospital EEG normal.  Please advise.

## 2019-11-05 NOTE — Telephone Encounter (Signed)
Please advise to take an extra dose of 500 mg Keppra today and then increase daily dosage to 750 mg twice daily.  A new order will be sent to pharmacy but she can break 500 mg tablets in half and take 1.5 tabs twice daily until able to pick up new prescription  We will also place order for repeat EEG due to recurrent seizures

## 2019-11-05 NOTE — Telephone Encounter (Addendum)
I called pts wife relayed that will increase keppra to 750mg  po bid tomorrow (taking 1.5 tablets po bid until prescription used up).  Will then pick up 750mg  tablet and take one po bid.  He is to take extra 500mg  tablet today (so 3 tabs total today).  EEG ordered so will get call to schedule.  Wife verbalized understanding.

## 2019-11-05 NOTE — Telephone Encounter (Signed)
Pt's wife called, notifying NP Pt had two seizures today. Pt's would like to know if she should take him to the hospital Please call

## 2019-11-05 NOTE — Progress Notes (Signed)
I agree with the above plan 

## 2019-11-06 ENCOUNTER — Other Ambulatory Visit (HOSPITAL_COMMUNITY): Payer: Self-pay | Admitting: Interventional Radiology

## 2019-11-06 DIAGNOSIS — Z95828 Presence of other vascular implants and grafts: Secondary | ICD-10-CM

## 2019-11-12 ENCOUNTER — Inpatient Hospital Stay: Payer: 59 | Admitting: Physical Medicine & Rehabilitation

## 2019-12-04 ENCOUNTER — Other Ambulatory Visit: Payer: 59

## 2020-01-15 ENCOUNTER — Other Ambulatory Visit: Payer: 59

## 2020-02-26 ENCOUNTER — Ambulatory Visit
Admission: RE | Admit: 2020-02-26 | Discharge: 2020-02-26 | Disposition: A | Discharge status: Home / Self Care | Payer: 59 | Source: Ambulatory Visit | Attending: Interventional Radiology | Admitting: Interventional Radiology

## 2020-02-26 ENCOUNTER — Encounter: Payer: Self-pay | Admitting: Adult Health

## 2020-02-26 ENCOUNTER — Ambulatory Visit: Payer: 59 | Admitting: Adult Health

## 2020-02-26 VITALS — BP 156/82 | HR 80 | Ht 71.0 in | Wt 376.0 lb

## 2020-02-26 DIAGNOSIS — I69398 Other sequelae of cerebral infarction: Secondary | ICD-10-CM | POA: Diagnosis not present

## 2020-02-26 DIAGNOSIS — E782 Mixed hyperlipidemia: Secondary | ICD-10-CM | POA: Diagnosis not present

## 2020-02-26 DIAGNOSIS — I6350 Cerebral infarction due to unspecified occlusion or stenosis of unspecified cerebral artery: Secondary | ICD-10-CM

## 2020-02-26 DIAGNOSIS — Z95828 Presence of other vascular implants and grafts: Secondary | ICD-10-CM

## 2020-02-26 DIAGNOSIS — I10 Essential (primary) hypertension: Secondary | ICD-10-CM

## 2020-02-26 DIAGNOSIS — I671 Cerebral aneurysm, nonruptured: Secondary | ICD-10-CM

## 2020-02-26 DIAGNOSIS — R569 Unspecified convulsions: Secondary | ICD-10-CM

## 2020-02-26 HISTORY — PX: IR RADIOLOGIST EVAL & MGMT: IMG5224

## 2020-02-26 MED ORDER — LEVETIRACETAM 1000 MG PO TABS: 1000.0000 mg | ORAL_TABLET | Freq: Two times a day (BID) | ORAL | 3 refills | Status: DC

## 2020-02-26 NOTE — Patient Instructions (Signed)
Continue aspirin 325 mg daily for secondary stroke prevention  Continue keppra 1000 mg twice daily for seizure prevention - refill provided  Continue to follow up with PCP regarding cholesterol and blood pressure management  Maintain strict control of hypertension with blood pressure goal below 130/90 and cholesterol with LDL cholesterol (bad cholesterol) goal below 70 mg/dL.     Followup in the future with me in 6 months or call earlier if needed      Thank you for coming to see [place] at [facility]. I hope we have been able to provide you high quality care today.  You may receive a patient satisfaction survey over the next few weeks. We would appreciate your feedback and comments so that we may continue to improve ourselves and the health of our patients.

## 2020-02-26 NOTE — Progress Notes (Signed)
[facility] [address] [place]. [place] [address] [phone]       STROKE FOLLOW UP NOTE  [name] Date of Birth:  Jan 01, 1966 Medical Record Number:  [id number]   Reason for Referral: stroke follow up    SUBJECTIVE:   CHIEF COMPLAINT:  Chief Complaint  Patient presents with  . Follow-up    rm 9  . Cerebrovascular Accident    pt says he has no new sx as far as the stroke. Pt said he recently has pneumonia    HPI:   Today, 02/26/2020, Andrew Perez returns for stroke and seizure follow-up accompanied by his wife.  Stable from stroke standpoint with residual gait impairment and occasional dizziness/vertigo which has been slowly improving.  He continues to use RW for ambulation.  Denies new or worsening stroke/TIA symptoms.  After prior visit, wife called office the following day for 2 seizure occurrences and advised to increase Keppra dose to 750 mg twice daily.  Recent episode approx 3 weeks ago of right hand numbness, slurred speech and then lost consciousness for approx 15-30 seconds. No postictal symptoms.  Transported to [facility] ED and found to have pleural effusion and PNA. He was working with therapies but placed on hold d/t recent admission. He is currently awaiting to be released by PCP for return to therapies.  At some point, Keppra dosage further increased to 1000 mg twice daily but unsure when this occurred.  He has remained on Keppra 1000 mg twice daily without side effects. Remains on aspirin 325 mg daily without bleeding or bruising. He has since stopped atorvastatin due to GI irritation with resolution.  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  No further concerns at this time.    History provided for reference purposes only Initial visit 11/04/2019 JM: Andrew Perez is being seen for hospital follow-up accompanied by his  wife.  Discharged home from [place] on 10/04/2019 with uneventful stay.  Residual deficits of cognitive impairment and imbalance as well as occasional difficulty swallowing chewy or hard foods.  He is frustrated regarding continued cognitive impairment and lack of improvement.  Initially participating in outpatient therapy at [facility] but placed on hold due to seizure type event during therapy on 10/25/2019 evaluated at [facility] ED.  Event consisted of generalized feeling of not feeling well and became diaphoretic and then progressed to staring and unresponsive and then lost consciousness for approximately 1 minute.  No postictal state per wife.  Unable to view hospital notes via epic.  Initiated Keppra 500 mg twice daily without recurrent seizure type activity.  Majority of antihypertensives discontinued except for clonidine.  Per wife, history of episodes of not feeling well and becoming diaphoretic but never further progress with typical trigger of increased heat.  Does complain of diarrhea since starting with needs of daily Imodium.  Unsure if diarrhea has improved as they have not trialed holding Imodium.  Plans on restarting outpatient therapy tomorrow -wife is unsure if he is participating in speech therapy.  Ambulates with rolling walker and denies any recent falls.  Denies new or worsening stroke/TIA symptoms.  Continues on aspirin 325 mg daily and atorvastatin 40 mg daily without side effects.  Blood pressure today 140/79.  Recent follow-up with endocrinology and has not had follow-up with neurosurgery since discharge.  No further concerns at this time.  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 SBP > 190 upon arrival.  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  Underwent right PICA aneurysm embolization with sacrifice of PICA segment.  Initiated aspirin 325 mg daily for stroke prevention.  Evidence of DVT left posterior tibial vein and placement of IVC filter as he is not anticoagulant candidate given IVH.  History of HTN found in hypertensive emergency with adjustments to antihypertensive regimen and long-term BP goal normotensive range.  LDL 104 initiate atorvastatin 40 mg daily.  Other stroke risk factors include morbid obesity, prolactinoma and OSA.  Evaluated by therapies and recommended discharge to CIR for ongoing therapy needs.  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 small volume IVH in 4th ventricle. Interval resection previous central skull base infiltrative mass. 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 No hydrocephalus. Known sellar mass w/ skull base erosion.   Cerebral angio right PICA 45mm aneurysm   MRI -Evolving bilateral PICA territory infarct, similar in extension to prior CT. There is no hydrocephalus.   Repeat CT head 6/8 /21 evolving nonhemorrhagic cerebral infarcts right greater than left   2D EchoEF 55-60%. No source of embolus. LA moderate dilated  LDL104  HgbA1c4.7  UDS + opiates  SCDs for VTE prophylaxis  No antithromboticprior to admission, was on No antithromboticgiven hemorrhage. Post aneurysm embolization, now off heparin IV, on ASA 325. Will not use DAPT given hemorrhage   Therapy recommendations: CIR  Disposition: CIR      ROS:   14 system review of systems performed and  negative with exception of gait impairment, dizziness and seizures  PMH:  Past Medical History:  Diagnosis Date  . Hypertension   . IVH (intraventricular hemorrhage) ([place]) 2018   due to HTN  . Prolactinoma ([place])    follow[facility] by [facility]  . Sleep apnea     PSH:  Past Surgical History:  Procedure Laterality Date  . IR 3D INDEPENDENT WKST  09/19/2019  . IR ANGIO INTRA EXTRACRAN SEL COM CAROTID INNOMINATE BILAT MOD SED  09/18/2019  . IR ANGIO VERTEBRAL SEL SUBCLAVIAN INNOMINATE UNI L MOD SED  09/18/2019  . IR ANGIO VERTEBRAL SEL VERTEBRAL UNI R MOD SED  09/18/2019  . IR ANGIO VERTEBRAL SEL VERTEBRAL UNI R MOD SED  09/19/2019  . IR ANGIOGRAM FOLLOW UP STUDY  09/19/2019  . IR CT HEAD LTD  09/19/2019  . IR IVC FILTER PLMT / S&I /IMG GUID/MOD SED  09/25/2019  . IR NEURO EACH ADD'L AFTER BASIC UNI RIGHT ([place])  09/19/2019  . IR TRANSCATH/EMBOLIZ  09/19/2019  . IR US GUIDE VASC ACCESS RIGHT  09/18/2019  . IR US GUIDE VASC ACCESS RIGHT  09/19/2019  . RADIOLOGY WITH ANESTHESIA N/A 09/19/2019   Procedure: RADIOLOGY WITH ANESTHESIA ANUERYSM COILING;  Surgeon: Pedro Earls, MD;  Location: [facility];  Service: Radiology;  Laterality: N/A;  . TONSILLECTOMY      Social History:  Social History   Socioeconomic History  . Marital status: Married    Spouse name: Not on file  . Number of children: Not on file  . Years of education: Not on file  . Highest education level: Not on file  Occupational History  . Not on file  Tobacco Use  . Smoking status: Never Smoker  . Smokeless tobacco: Never Used  Substance and Sexual Activity  . Alcohol use: No  . Drug use: No  . Sexual activity: Not on file  Other Topics Concern  . Not on file  Social History Narrative  . Not on file   Social Determinants of Health   Financial Resource Strain:   . Difficulty of Paying Living Expenses: Not on file  Food Insecurity:   . Worried About Charity fundraiser in the Last Year: Not on file  . Ran Out of Food in  the Last Year: Not on file  Transportation Needs:   . Lack of Transportation (Medical): Not on file  . Lack of Transportation (Non-Medical): Not on file  Physical Activity:   . Days of Exercise per Week: Not on file  . Minutes of Exercise per Session: Not on file  Stress:   . Feeling of Stress : Not on file  Social Connections:   . Frequency of Communication with Friends and Family: Not on file  . Frequency of Social Gatherings with Friends and Family: Not on file  . Attends Religious Services: Not on file  . Active Member of Clubs or Organizations: Not on file  . Attends Archivist Meetings: Not on file  . Marital Status: Not on file  Intimate Partner Violence:   . Fear of Current or Ex-Partner: Not on file  . Emotionally Abused: Not on file  . Physically Abused: Not on file  . Sexually Abused: Not on file    Family History:  Family History  Problem Relation Age of Onset  . Colon cancer Father     Medications:   Current Outpatient Medications on File Prior to Visit  Medication Sig Dispense Refill  . hydrALAZINE (APRESOLINE) 100 MG tablet Take 100 mg by mouth 3 (three) times daily.    Marland Kitchen aspirin 325 MG tablet Take 1 tablet (325 mg total) by mouth daily.    . cabergoline (DOSTINEX) 0.5 MG tablet Take 0.5 mg by mouth 2 (two) times a week. Saturday and wednesday  0  . cloNIDine (CATAPRES - DOSED IN MG/24 HR) 0.3 mg/24hr patch Place 1 patch (0.3 mg total) onto the skin every 7 (seven) days. Change every Saturday 4 patch 12   No current facility-administered medications on file prior to visit.    Allergies:   Allergies  Allergen Reactions  . Codeine Shortness Of Breath  . Shellfish Allergy Shortness Of Breath      OBJECTIVE:  Physical Exam  Vitals:   02/26/20 1239  BP: (!) 156/82  Pulse: 80  Weight: (!) 376 lb (170.6 kg)  Height: 5\' 11"  (1.803 m)   Body mass index is 52.44 kg/m. No exam data present  General: Morbidly obese pleasant middle-age  Caucasian male, seated, in no evident distress Head: head normocephalic and atraumatic.   Neck: supple with no carotid or supraclavicular bruits Cardiovascular: regular rate and rhythm, no murmurs Musculoskeletal: no deformity Skin:  no rash/petichiae Vascular:  Normal pulses all extremities   Neurologic Exam Mental Status: Awake and fully alert.   Fluent speech and language.  Oriented to place and time. Recent  memory and remote memory intact. Attention span, concentration and fund of knowledge appropriate during visit. Mood and affect flat.  Cranial Nerves: Pupils equal, briskly reactive to light. Extraocular movements full without nystagmus. Visual fields full to confrontation. Hearing intact. Facial sensation intact. Face, tongue, palate moves normally and symmetrically.  Motor: Normal bulk and tone. Normal strength in all tested extremity muscles. Sensory.: intact to touch , pinprick , position and vibratory sensation.  Coordination: Rapid alternating movements normal in all extremities. Finger-to-nose performed accurately bilaterally and heel-to-shin difficulty performing Gait and Station: Arises from chair without difficulty. Stance is normal. Gait demonstrates  normal stride length and balance with use of Rollator walker.  Tandem walk not attempted. Reflexes: 1+ and symmetric. Toes downgoing.        ASSESSMENT/PLAN: Andrew Perez is a 54 y.o. year old male presented with dizziness with episode of vomiting followed by headache and hypertensive urgency on 09/17/2019 with stroke work-up revealing bilateral PICA territory infarcts and small fourth ventricle IVH in setting of dissecting right PICA aneurysm and hypertensive emergency. S/p R PICA aneurysm embolization with sacrifice of the right PICA at telovelotonsillar segment 6/3.  Seizure-like episode 10/25/2019 evaluated at [facility]  Vascular risk factors include HTN, HLD, IVH 2018, prolactinoma dx 06/2016, morbid obesity, acute DVT  s/p IVC filter and OSA on CPAP.     1. 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 Residual deficits: Mild gait impairment and occasional dizziness/vertigo.  Advised to restart therapies once cleared by PCP post PNA for hopeful ongoing recovery.  Advised ongoing use of rolling walker at all times for fall prevention b. Continue aspirin 325 mg daily for secondary stroke prevention.  Advised to follow-up with PCP in regards to restarting statin once able for secondary stroke prevention c. Discussed secondary stroke prevention measures and importance of close PCP follow-up for aggressive stroke risk factor management 2. Seizure, late effect of stroke:  a. Likely secondary to recent stroke and IVH as well as known prolactinoma.   b. Initial seizure activity 10/2019 and reoccurring 02/02/2020 likely in setting of pleural effusion and pneumonia.  c. Continue Keppra 1000 mg twice daily for seizure prophylaxis -refill provided d. Discussed importance of avoiding seizure triggers and to call office with any reoccurring seizure activity or symptoms.   3. Dissecting R PICA aneurysm s/p embolization: Continue to follow neurosurgery with plans on undergoing cerebral angiogram 12/17 for surveillance monitoring 4. LLE acute DVT s/p IVC filter: Not a candidate for anticoagulation.  Continue to follow with NIR for monitoring management 5. HTN: BP goal<130/90.  Stable today.  Managed by PCP 6. HLD: LDL goal<70.  On atorvastatin per PCP 7. Prolactinoma: Continue to follow with [facility] endocrinology and neurosurgery for routine monitoring and management    Follow up in 6 months or call earlier if needed   I spent 40 minutes of face-to-face and non-face-to-face time with patient and wife.  This included previsit chart review, lab review, study review, order entry, electronic health record documentation, patient education regarding recent stroke, residual deficits, breakthrough seizure activity and recent setting  of pneumonia, aneurysm and prolactinoma monitoring, importance of managing stroke risk factors and answered all questions to patient and wifes satisfaction   Frann Rider, AGNP-BC  [facility] [address] [place], [place] [address]  Phone [phone] Fax [phone] Note: This document was prepared with digital dictation and possible smart phrase technology. Any transcriptional errors that result from this process are unintentional.

## 2020-02-26 NOTE — Progress Notes (Signed)
Patient ID: Andrew Perez, male   DOB: [birth year], 54 y.o.   MRN: [id number]       Chief Complaint: Patient was seen in consultation today for follow-up IVC filter placement at the request of Krosby Ritchie  Referring Physician(s): Manroop Jakubowicz  History of Present Illness: Andrew Perez is a 54 y.o. male  With a past medical history significant for intracranial hemorrhage post right PICA embolization 09/18/2019 who was later diagnosed with left posterior tibial DVT and due to relative contraindications for anticoagulation, retrievable IVC filter was placed 09/25/2019.  Follow-up ultrasound 10/02/2019 showed no lower extremity DVT. Bilateral lower extremity venous Doppler ultrasound today again shows no lower extremity DVT.  He is scheduled to follow-up with Dr. Annita Brod neurosurgery on December 17.  He was seen today along with his spouse in attendance.  Past Medical History:  Diagnosis Date  . Hypertension   . IVH (intraventricular hemorrhage) ([place]) 2018   due to HTN  . Prolactinoma ([place])    followed by [facility]  . Sleep apnea     Past Surgical History:  Procedure Laterality Date  . IR 3D INDEPENDENT WKST  09/19/2019  . IR ANGIO INTRA EXTRACRAN SEL COM CAROTID INNOMINATE BILAT MOD SED  09/18/2019  . IR ANGIO VERTEBRAL SEL SUBCLAVIAN INNOMINATE UNI L MOD SED  09/18/2019  . IR ANGIO VERTEBRAL SEL VERTEBRAL UNI R MOD SED  09/18/2019  . IR ANGIO VERTEBRAL SEL VERTEBRAL UNI R MOD SED  09/19/2019  . IR ANGIOGRAM FOLLOW UP STUDY  09/19/2019  . IR CT HEAD LTD  09/19/2019  . IR IVC FILTER PLMT / S&I /IMG GUID/MOD SED  09/25/2019  . IR NEURO EACH ADD'L AFTER BASIC UNI RIGHT (MS)  09/19/2019  . IR RADIOLOGIST EVAL & MGMT  02/26/2020  . IR TRANSCATH/EMBOLIZ  09/19/2019  . IR US GUIDE VASC ACCESS RIGHT  09/18/2019  . IR US GUIDE VASC ACCESS RIGHT  09/19/2019  . RADIOLOGY WITH ANESTHESIA N/A 09/19/2019   Procedure: RADIOLOGY WITH ANESTHESIA ANUERYSM COILING;  Surgeon: Pedro Earls, MD;   Location: [place];  Service: Radiology;  Laterality: N/A;  . TONSILLECTOMY      Allergies: Codeine and Shellfish allergy  Medications: Prior to Admission medications   Medication Sig Start Date End Date Taking? Authorizing Provider  aspirin 325 MG tablet Take 1 tablet (325 mg total) by mouth daily. 09/28/19   Donzetta Starch, NP  cabergoline (DOSTINEX) 0.5 MG tablet Take 0.5 mg by mouth 2 (two) times a week. Saturday and wednesday 06/24/16   [provider]  cloNIDine (CATAPRES - DOSED IN MG/24 HR) 0.3 mg/24hr patch Place 1 patch (0.3 mg total) onto the skin every 7 (seven) days. Change every Saturday 10/04/19   Bary Leriche, PA-C  hydrALAZINE (APRESOLINE) 100 MG tablet Take 100 mg by mouth 3 (three) times daily.    [provider]  levETIRAcetam (KEPPRA) 1000 MG tablet Take 1 tablet (1,000 mg total) by mouth 2 (two) times daily. 02/26/20   Frann Rider, NP     Family History  Problem Relation Age of Onset  . Colon cancer Father     Social History   Socioeconomic History  . Marital status: Married    Spouse name: Not on file  . Number of children: Not on file  . Years of education: Not on file  . Highest education level: Not on file  Occupational History  . Not on file  Tobacco Use  . Smoking status: Never Smoker  .  Smokeless tobacco: Never Used  Substance and Sexual Activity  . Alcohol use: No  . Drug use: No  . Sexual activity: Not on file  Other Topics Concern  . Not on file  Social History Narrative  . Not on file   Social Determinants of Health   Financial Resource Strain:   . Difficulty of Paying Living Expenses: Not on file  Food Insecurity:   . Worried About Charity fundraiser in the Last Year: Not on file  . Ran Out of Food in the Last Year: Not on file  Transportation Needs:   . Lack of Transportation (Medical): Not on file  . Lack of Transportation (Non-Medical): Not on file  Physical Activity:   . Days of Exercise per Week: Not on  file  . Minutes of Exercise per Session: Not on file  Stress:   . Feeling of Stress : Not on file  Social Connections:   . Frequency of Communication with Friends and Family: Not on file  . Frequency of Social Gatherings with Friends and Family: Not on file  . Attends Religious Services: Not on file  . Active Member of Clubs or Organizations: Not on file  . Attends Archivist Meetings: Not on file  . Marital Status: Not on file    ECOG Status: 1 - Symptomatic but completely ambulatory  Review of Systems: A 12 point ROS discussed and pertinent positives are indicated in the HPI above.  All other systems are negative.  Review of Systems  Vital Signs: There were no vitals taken for this visit.  Physical Exam Constitutional: Oriented to person, place, and time. Well-developed and well-nourished. No distress.   HENT:  Head: Normocephalic and atraumatic.  Eyes: Conjunctivae and EOM are normal. Right eye exhibits no discharge. Left eye exhibits no discharge. No scleral icterus.  Neck: No JVD present.  Pulmonary/Chest: Effort normal. No stridor. No respiratory distress.  Abdomen: soft, non distended Neurological:  alert and oriented to person, place, and time.  Skin: Skin is warm and dry.  not diaphoretic.  Bilateral lower extremity edema. Psychiatric:   normal mood and affect.   behavior is normal. Judgment and thought content normal.   Mallampati Score:     Imaging: BILATERAL LOWER EXTREMITY VENOUS DOPPLER ULTRASOUND   TECHNIQUE: Gray-scale sonography with compression, as well as color and duplex ultrasound, were performed to evaluate the deep venous system(s) from the level of the common femoral vein through the popliteal and proximal calf veins.   COMPARISON:  10/02/2019 by report only   FINDINGS: VENOUS   Normal compressibility of the common femoral, superficial femoral, and popliteal veins, as well as the visualized calf veins. Visualized portions of  profunda femoral vein and great saphenous vein unremarkable. No filling defects to suggest DVT on grayscale or color Doppler imaging. Doppler waveforms show normal direction of venous flow, normal respiratory phasicity and response to augmentation.   OTHER   None.   Limitations: Technologist describes technically difficult study secondary to calf edema and shadowing subcutaneous calcifications limiting calf vein visualization.   IMPRESSION: No evidence of lower extremity DVT.     Electronically Signed   By: Lucrezia Europe M.D.   On: 02/26/2020 15:12  Labs:  CBC: Recent Labs    09/21/19 0511 09/22/19 0355 09/23/19 1251 09/30/19 1354  WBC 14.4* 13.5* 11.5* 9.6  HGB 12.3* 13.3 12.2* 11.9*  HCT 36.9* 39.3 35.6* 36.2*  PLT 271 268 225 225    COAGS: Recent Labs  09/19/19 0515  INR 1.0    BMP: Recent Labs    09/22/19 0355 09/23/19 1251 09/28/19 0438 09/30/19 1354  NA 133* 131* 140 138  K 4.3 4.1 3.5 4.1  CL 98 96* 99 100  CO2 23 25 31 29   GLUCOSE 128* 115* 105* 130*  BUN 15 17 16 16   CALCIUM 9.0 8.8* 9.1 9.5  CREATININE 0.84 0.80 0.76 1.03  GFRNONAA >60 >60 >60 >60  GFRAA >60 >60 >60 >60    LIVER FUNCTION TESTS: Recent Labs    09/30/19 1354  BILITOT 1.2  AST 26  ALT 17  ALKPHOS 52  PROT 7.1  ALBUMIN 3.3*    TUMOR MARKERS: No results for input(s): AFPTM, CEA, CA199, CHROMGRNA in the last 8760 hours.  Assessment and Plan:  My impression is that this patient has done well since placement of the retrievable IVC filter.  2 subsequent lower extremity ultrasound venous studies have shown no residual or recurrent DVT.  We reviewed briefly the pathophysiology of pulmonary thromboembolic disease.  We reviewed the structure and function of modern IVC filters and the role in the setting of contraindications to or failure of anticoagulation.  We reviewed possible risks and complications of long-term caval filtration including filter fracture, caval wall and  adjacent organ perforation, and fragment migration as well as caval thrombosis.  The patient seemed to understand and had  questions answered.   We discussed technique of transjugular inferior vena cavography and filter retrieval under moderate sedation as an outpatient.  They seemed to understand and had  questions answered.  There was some question whether the patient may need additional intervention after his visit with neurosurgery on December 17.  There is no great urgency for  filter retrieval, so we can review his clinical situation telephonically after that visit to see if filter retrieval remains an appropriate option.  If the patient will need to have additional procedures which require continued caval filtration, of course the filter can remain in place as needed.  Thank you for this interesting consult.  I greatly enjoyed meeting Andrew Perez and look forward to participating in their care.  A copy of this report was sent to the requesting provider on this date.  Electronically Signed: Rickard Rhymes 02/26/2020, 3:14 PM   I spent a total of    25 Minutes in face to face in clinical consultation, greater than 50% of which was counseling/coordinating care for presence of IVC filter.

## 2020-02-27 ENCOUNTER — Encounter: Payer: Self-pay | Admitting: Adult Health

## 2020-02-28 NOTE — Progress Notes (Signed)
I agree with the above plan 

## 2020-04-06 ENCOUNTER — Other Ambulatory Visit: Payer: Self-pay | Admitting: Physical Medicine and Rehabilitation

## 2020-04-15 ENCOUNTER — Other Ambulatory Visit: Payer: Self-pay | Admitting: Interventional Radiology

## 2020-04-15 DIAGNOSIS — Z95828 Presence of other vascular implants and grafts: Secondary | ICD-10-CM

## 2020-06-03 ENCOUNTER — Encounter: Payer: Self-pay | Admitting: *Deleted

## 2020-06-03 ENCOUNTER — Other Ambulatory Visit (HOSPITAL_COMMUNITY): Payer: Self-pay | Admitting: Interventional Radiology

## 2020-06-03 ENCOUNTER — Ambulatory Visit
Admission: RE | Admit: 2020-06-03 | Discharge: 2020-06-03 | Disposition: A | Discharge status: Home / Self Care | Payer: 59 | Source: Ambulatory Visit | Attending: Interventional Radiology | Admitting: Interventional Radiology

## 2020-06-03 ENCOUNTER — Other Ambulatory Visit: Payer: Self-pay

## 2020-06-03 DIAGNOSIS — Z95828 Presence of other vascular implants and grafts: Secondary | ICD-10-CM

## 2020-06-03 HISTORY — PX: IR RADIOLOGIST EVAL & MGMT: IMG5224

## 2020-06-03 NOTE — Progress Notes (Signed)
Patient ID: Andrew Perez, male   DOB: 1965/10/07, 55 y.o.   MRN: [id number]       Chief Complaint: Patient was consulted remotely today ([place]) for presence of IVC filter at the request of Shellie Rogoff.    Referring Physician(s): Garvin Fila, MD  History of Present Illness: [name] is a 55 y.o. 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 11/10/2021Bilateral lower extremity venous Doppler ultrasound  again  no lower extremity DVT.   04/03/2020 scheduled   follow-up with Dr. Annita Brod neurosurgery.  Subsequent cerebral arteriogram should good result post aneurysm coiling. He is back to full activity.  He is driving and has returned to [place].  No new lower extremity symptoms.   Past Medical History:  Diagnosis Date  . Hypertension   . IVH (intraventricular hemorrhage) ([place]) 2018   due to HTN  . Prolactinoma ([place])    followed by [facility]  . Sleep apnea     Past Surgical History:  Procedure Laterality Date  . IR 3D INDEPENDENT WKST  09/19/2019  . IR ANGIO INTRA EXTRACRAN SEL COM CAROTID INNOMINATE BILAT MOD SED  09/18/2019  . IR ANGIO VERTEBRAL SEL SUBCLAVIAN INNOMINATE UNI L MOD SED  09/18/2019  . IR ANGIO VERTEBRAL SEL VERTEBRAL UNI R MOD SED  09/18/2019  . IR ANGIO VERTEBRAL SEL VERTEBRAL UNI R MOD SED  09/19/2019  . IR ANGIOGRAM FOLLOW UP STUDY  09/19/2019  . IR CT HEAD LTD  09/19/2019  . IR IVC FILTER PLMT / S&I /IMG GUID/MOD SED  09/25/2019  . IR NEURO EACH ADD'L AFTER BASIC UNI RIGHT (MS)  09/19/2019  . IR RADIOLOGIST EVAL & MGMT  02/26/2020  . IR TRANSCATH/EMBOLIZ  09/19/2019  . IR US GUIDE VASC ACCESS RIGHT  09/18/2019  . IR US GUIDE VASC ACCESS RIGHT  09/19/2019  . RADIOLOGY WITH  ANESTHESIA N/A 09/19/2019   Procedure: RADIOLOGY WITH ANESTHESIA ANUERYSM COILING;  Surgeon: Pedro Earls, MD;  Location: [place];  Service: Radiology;  Laterality: N/A;  . TONSILLECTOMY      Allergies: Codeine and Shellfish allergy  Medications: Prior to Admission medications   Medication Sig Start Date End Date Taking? Authorizing Provider  aspirin 325 MG tablet Take 1 tablet (325 mg total) by mouth daily. 09/28/19   Donzetta Starch, NP  cabergoline (DOSTINEX) 0.5 MG tablet Take 0.5 mg by mouth 2 (two) times a week. Saturday and wednesday 06/24/16   [provider]  cloNIDine (CATAPRES - DOSED IN MG/24 HR) 0.3 mg/24hr patch Place 1 patch (0.3 mg total) onto the skin every 7 (seven) days. Change every Saturday 10/04/19   Bary Leriche, PA-C  hydrALAZINE (APRESOLINE) 100 MG tablet Take 100 mg by mouth 3 (three) times daily.    [provider]  levETIRAcetam (KEPPRA) 1000 MG tablet Take 1 tablet (1,000 mg total) by mouth 2 (two) times daily. 02/26/20   Frann Rider, NP     Family History  Problem Relation Age of Onset  . Colon cancer Father     Social History   Socioeconomic History  . Marital status: Married    Spouse name: Not on file  . Number of children: Not on file  . Years of education: Not on  file  . Highest education level: Not on file  Occupational History  . Not on file  Tobacco Use  . Smoking status: Never Smoker  . Smokeless tobacco: Never Used  Substance and Sexual Activity  . Alcohol use: No  . Drug use: No  . Sexual activity: Not on file  Other Topics Concern  . Not on file  Social History Narrative  . Not on file   Social Determinants of Health   Financial Resource Strain: Not on file  Food Insecurity: Not on file  Transportation Needs: Not on file  Physical Activity: Not on file  Stress: Not on file  Social Connections: Not on file    ECOG Status: 0 - Asymptomatic  Review of Systems  Review of Systems: A 12  point ROS discussed and pertinent positives are indicated in the HPI above.  All other systems are negative.  Physical Exam No direct physical exam was performed (except for noted visual exam findings with Video Visits).     Vital Signs: There were no vitals taken for this visit.  Imaging: No results found.  Labs:  CBC: Recent Labs    09/21/19 0511 09/22/19 0355 09/23/19 1251 09/30/19 1354  WBC 14.4* 13.5* 11.5* 9.6  HGB 12.3* 13.3 12.2* 11.9*  HCT 36.9* 39.3 35.6* 36.2*  PLT 271 268 225 225    COAGS: Recent Labs    09/19/19 0515  INR 1.0    BMP: Recent Labs    09/22/19 0355 09/23/19 1251 09/28/19 0438 09/30/19 1354  NA 133* 131* 140 138  K 4.3 4.1 3.5 4.1  CL 98 96* 99 100  CO2 23 25 31 29   GLUCOSE 128* 115* 105* 130*  BUN 15 17 16 16   CALCIUM 9.0 8.8* 9.1 9.5  CREATININE 0.84 0.80 0.76 1.03  GFRNONAA >60 >60 >60 >60  GFRAA >60 >60 >60 >60    LIVER FUNCTION TESTS: Recent Labs    09/30/19 1354  BILITOT 1.2  AST 26  ALT 17  ALKPHOS 52  PROT 7.1  ALBUMIN 3.3*    TUMOR MARKERS: No results for input(s): AFPTM, CEA, CA199, CHROMGRNA in the last 8760 hours.  Assessment and Plan:  My impression is that this patient has done well since placement of the retrievable IVC filter.  2 subsequent lower extremity ultrasound venous studies have shown no residual or recurrent DVT.   We reviewed again briefly the pathophysiology of pulmonary thromboembolic disease.  We reviewed the structure and function of modern IVC filters and the role in the setting of contraindications to or failure of anticoagulation.  We reviewed possible risks and complications of long-term caval filtration including filter fracture, caval wall and adjacent organ perforation, and fragment migration as well as caval thrombosis.  The patient seemed to understand and had  questions answered.   We discussed technique of transjugular inferior vena cavography and filter retrieval under moderate  sedation as an outpatient.    He seemed to understand and had  questions answered.  Clinically, he is an appropriate candidate for filter retrieval at this point.  Accordingly, we will set him up for outpatient IVC filter retrieval via right IJ approach at [facility] with moderate sedation.   Thank you for this interesting consult.  I greatly enjoyed meeting JORDANI NUNN and look forward to participating in their care.  A copy of this report was sent to the requesting provider on this date.  Electronically Signed: Rickard Rhymes 06/03/2020, 10:52 AM   I spent  a total of    15 Minutes in remote  clinical consultation, greater than 50% of which was counseling/coordinating care for presence of IVC filter.    Visit type: Audio only (telephone). Audio (no video) only due to patient's lack of internet/smartphone capability. Alternative for in-person consultation at [facility], [address], [place], [place]. This visit type was conducted due to national recommendations for restrictions regarding the COVID-19 Pandemic (e.g. social distancing).  This format is felt to be most appropriate for this patient at this time.  All issues noted in this document were discussed and addressed.

## 2020-06-08 ENCOUNTER — Telehealth (HOSPITAL_COMMUNITY): Payer: Self-pay

## 2020-06-08 NOTE — Telephone Encounter (Signed)
Called to schedule ivc filter retreival, no answer, left vm. AW

## 2020-06-11 ENCOUNTER — Other Ambulatory Visit: Payer: Self-pay | Admitting: Radiology

## 2020-06-12 ENCOUNTER — Other Ambulatory Visit: Payer: Self-pay | Admitting: Student

## 2020-06-15 ENCOUNTER — Other Ambulatory Visit (HOSPITAL_COMMUNITY): Payer: Self-pay | Admitting: Interventional Radiology

## 2020-06-15 ENCOUNTER — Other Ambulatory Visit: Payer: Self-pay | Admitting: Radiology

## 2020-06-15 ENCOUNTER — Ambulatory Visit (HOSPITAL_COMMUNITY)
Admission: RE | Admit: 2020-06-15 | Discharge: 2020-06-15 | Disposition: A | Discharge status: Home / Self Care | Payer: 59 | Source: Ambulatory Visit | Attending: Interventional Radiology | Admitting: Interventional Radiology

## 2020-06-15 ENCOUNTER — Other Ambulatory Visit: Payer: Self-pay

## 2020-06-15 DIAGNOSIS — Z95828 Presence of other vascular implants and grafts: Secondary | ICD-10-CM

## 2020-06-15 DIAGNOSIS — Z885 Allergy status to narcotic agent status: Secondary | ICD-10-CM | POA: Diagnosis not present

## 2020-06-15 DIAGNOSIS — Z79890 Hormone replacement therapy (postmenopausal): Secondary | ICD-10-CM | POA: Insufficient documentation

## 2020-06-15 DIAGNOSIS — Z91013 Allergy to seafood: Secondary | ICD-10-CM | POA: Diagnosis not present

## 2020-06-15 DIAGNOSIS — Z4589 Encounter for adjustment and management of other implanted devices: Secondary | ICD-10-CM | POA: Insufficient documentation

## 2020-06-15 DIAGNOSIS — Z79899 Other long term (current) drug therapy: Secondary | ICD-10-CM | POA: Diagnosis not present

## 2020-06-15 DIAGNOSIS — Z7982 Long term (current) use of aspirin: Secondary | ICD-10-CM | POA: Diagnosis not present

## 2020-06-15 HISTORY — PX: IR US GUIDE VASC ACCESS RIGHT: IMG2390

## 2020-06-15 HISTORY — PX: IR IVC FILTER RETRIEVAL / S&I /IMG GUID/MOD SED: IMG5308

## 2020-06-15 LAB — BASIC METABOLIC PANEL
Anion gap: 8 (ref 5–15)
BUN: 16 mg/dL (ref 6–20)
CO2: 25 mmol/L (ref 22–32)
Calcium: 9 mg/dL (ref 8.9–10.3)
Chloride: 106 mmol/L (ref 98–111)
Creatinine, Ser: 1.04 mg/dL (ref 0.61–1.24)
GFR, Estimated: >60 mL/min (ref >60)
Glucose, Bld: 93 mg/dL (ref 70–99)
Potassium: 4.7 mmol/L (ref 3.5–5.1)
Sodium: 139 mmol/L (ref 135–145)

## 2020-06-15 MED ORDER — MIDAZOLAM HCL 2 MG/2ML IJ SOLN
INTRAMUSCULAR | Status: AC
  Filled 2020-06-15: qty 2

## 2020-06-15 MED ORDER — IOHEXOL 300 MG/ML  SOLN
100.0000 mL | Freq: Once | INTRAMUSCULAR | Status: AC | PRN
  Administered 2020-06-15: 20 mL via INTRAVENOUS

## 2020-06-15 MED ORDER — SODIUM CHLORIDE 0.9 % IV SOLN: INTRAVENOUS | Status: DC

## 2020-06-15 MED ORDER — LIDOCAINE HCL 1 % IJ SOLN
INTRAMUSCULAR | Status: AC
  Filled 2020-06-15: qty 20

## 2020-06-15 MED ORDER — LIDOCAINE HCL (PF) 1 % IJ SOLN
INTRAMUSCULAR | Status: AC | PRN
  Administered 2020-06-15: 5 mL

## 2020-06-15 MED ORDER — FENTANYL CITRATE (PF) 100 MCG/2ML IJ SOLN
INTRAMUSCULAR | Status: AC | PRN
Start: 2020-06-15 — End: 2020-06-15
  Administered 2020-06-15: 25 ug via INTRAVENOUS

## 2020-06-15 MED ORDER — FENTANYL CITRATE (PF) 100 MCG/2ML IJ SOLN
INTRAMUSCULAR | Status: AC
  Filled 2020-06-15: qty 2

## 2020-06-15 MED ORDER — MIDAZOLAM HCL 2 MG/2ML IJ SOLN
INTRAMUSCULAR | Status: AC | PRN
  Administered 2020-06-15: 1 mg via INTRAVENOUS

## 2020-06-15 NOTE — Discharge Instructions (Addendum)
Inferior Vena Cava Filter Removal, Care After This sheet gives you information about how to care for yourself after your procedure. Your health care provider may also give you more specific instructions. If you have problems or questions, contact your health care provider. What can I expect after the procedure? After the procedure, it is common to have:  Mild pain and bruising around the area where the long, thin tube (catheter) was inserted in your neck or groin.  Tiredness (fatigue). Follow these instructions at home: Insertion site care  Follow instructions from your health care provider about how to take care of your catheter insertion site. Make sure you: ? Wash your hands with soap and water for at least 20 seconds before and after you change your bandage (dressing). If soap and water are not available, use hand sanitizer. ? Change your dressing as told by your health care provider.  Check your insertion site every day for signs of infection. Check for: ? Redness, swelling, or more pain. ? Fluid or blood. ? Warmth. ? Pus or a bad smell.   General instructions  Take over-the-counter and prescription medicines only as told by your health care provider.  Do not take baths, swim, or use a hot tub until your health care provider approves. Ask your health care provider if you may take showers.  If you were given a sedative during the procedure, it can affect you for several hours. Do not drive or operate machinery until your health care provider says that it is safe.  Return to your normal activities as told by your health care provider. Ask your health care provider what activities are safe for you.  Keep all follow-up visits as told by your health care provider. This is important. Contact a health care provider if:  You have chills or a fever.  You have redness, swelling, or more pain around your catheter insertion site.  Your insertion site feels warm to the touch.  You have  pus or a bad smell coming from your insertion site. Get help right away if:  You have blood coming from your catheter insertion site (active bleeding). ? If you have bleeding from the insertion site, lie down, apply pressure to the area with a clean cloth or gauze, and get help right away.  You have chest pain.  You have difficulty breathing. Summary  Follow instructions from your health care provider about how to take care of your catheter insertion site.  Return to your normal activities as told by your health care provider.  Check your catheter insertion site every day for signs of infection.  Get help right away if you have active bleeding, chest pain, or trouble breathing. This information is not intended to replace advice given to you by your health care provider. Make sure you discuss any questions you have with your health care provider. Document Revised: 04/03/2019 Document Reviewed: 04/03/2019 Elsevier Patient Education  2021 Elsevier Inc. Moderate Conscious Sedation, Adult Sedation is the use of medicines to promote relaxation and to relieve discomfort and anxiety. Moderate conscious sedation is a type of sedation. Under moderate conscious sedation, you are less alert than normal, but you are still able to respond to instructions, touch, or both. Moderate conscious sedation is used during short medical and dental procedures. It is milder than deep sedation, which is a type of sedation under which you cannot be easily woken up. It is also milder than general anesthesia, which is the use of medicines to make   you unconscious. Moderate conscious sedation allows you to return to your regular activities sooner. Tell a health care provider about:  Any allergies you have.  All medicines you are taking, including vitamins, herbs, eye drops, creams, and over-the-counter medicines.  Any use of steroids. This includes steroids taken by mouth or as a cream.  Any problems you or family  members have had with sedatives and anesthetic medicines.  Any blood disorders you have.  Any surgeries you have had.  Any medical conditions you have, such as sleep apnea.  Whether you are pregnant or may be pregnant.  Any use of cigarettes, alcohol, marijuana, or drugs. What are the risks? Generally, this is a safe procedure. However, problems may occur, including:  Getting too much medicine (oversedation).  Nausea.  Allergic reaction to medicines.  Trouble breathing. If this happens, a breathing tube may be used. It will be removed when you are awake and breathing on your own.  Heart trouble.  Lung trouble.  Confusion that gets better with time (emergence delirium). What happens before the procedure? Staying hydrated Follow instructions from your health care provider about hydration, which may include:  Up to 2 hours before the procedure - you may continue to drink clear liquids, such as water, clear fruit juice, black coffee, and plain tea. Eating and drinking restrictions Follow instructions from your health care provider about eating and drinking, which may include:  8 hours before the procedure - stop eating heavy meals or foods, such as meat, fried foods, or fatty foods.  6 hours before the procedure - stop eating light meals or foods, such as toast or cereal.  6 hours before the procedure - stop drinking milk or drinks that contain milk.  2 hours before the procedure - stop drinking clear liquids. Medicines Ask your health care provider about:  Changing or stopping your regular medicines. This is especially important if you are taking diabetes medicines or blood thinners.  Taking medicines such as aspirin and ibuprofen. These medicines can thin your blood. Do not take these medicines unless your health care provider tells you to take them.  Taking over-the-counter medicines, vitamins, herbs, and supplements. Tests and exams  You will have a physical  exam.  You may have blood tests done to show how well: ? Your kidneys and liver work. ? Your blood clots. General instructions  Plan to have a responsible adult take you home from the hospital or clinic.  If you will be going home right after the procedure, plan to have a responsible adult care for you for the time you are told. This is important. What happens during the procedure?  You will be given the sedative. The sedative may be given: ? As a pill that you will swallow. It can also be inserted into the rectum. ? As a spray through the nose. ? As an injection into the muscle. ? As an injection into the vein through an IV.  You may be given oxygen as needed.  Your breathing, heart rate, and blood pressure will be monitored during the procedure.  The medical or dental procedure will be done. The procedure may vary among health care providers and hospitals.   What happens after the procedure?  Your blood pressure, heart rate, breathing rate, and blood oxygen level will be monitored until you leave the hospital or clinic.  You will get fluids through your IV if needed.  Do not drive or operate machinery until your health care provider says that   it is safe. Summary  Sedation is the use of medicines to promote relaxation and to relieve discomfort and anxiety. Moderate conscious sedation is a type of sedation that is used during short medical and dental procedures.  Tell the health care provider about any medical conditions that you have and about all the medicines that you are taking.  You will be given the sedative as a pill, a spray through the nose, an injection into the muscle, or an injection into the vein through an IV. Vital signs are monitored during the sedation.  Moderate conscious sedation allows you to return to your regular activities sooner. This information is not intended to replace advice given to you by your health care provider. Make sure you discuss any  questions you have with your health care provider. Document Revised: 08/02/2019 Document Reviewed: 02/28/2019 Elsevier Patient Education  2021 Elsevier Inc.  

## 2020-06-15 NOTE — Progress Notes (Addendum)
Discharge instructions reviewed with pt and his wife (via telephone) both voice understanding. Spoke with Dr Vernard Gambles to clarify order states ok to d/c at 1315.

## 2020-06-15 NOTE — H&P (Signed)
Chief Complaint: Patient was seen in consultation today for Inferior vena cava filter retrieval at the request of Dr Lavera Guise   Supervising Physician: Arne Cleveland  Patient Status: [facility] - Out-pt  History of Present Illness: Andrew Perez is a 55 y.o. male   IVC filter placed 09/25/19 after diagnosis LE DVT Pt was not anticoagulation candidate secondary recent dissected Rt PICA aneurysm.  Embolization of same 09/20/19  Dr Vernard Gambles note 06/03/20: past medical history significant for 09/18/2019 intracranial hemorrhage post right PICA embolization who was later diagnosed with left posterior tibial DVT and due to relative contraindications for anticoagulation, 09/25/2019 retrievable IVC filter was placed   10/02/2019 follow-up ultrasound showed no lower extremity DVT. 11/10/2021Bilateral lower extremity venous Doppler ultrasound  again  no lower extremity DVT.  04/03/2020 scheduled   follow-up with Dr. Annita Brod neurosurgery.  Subsequent cerebral arteriogram should good result post aneurysm coiling. He is back to full activity.  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  No new lower extremity symptoms.  Scheduled now for IVC filter retrieval  Hx remote Hx "shrimp allergy-- rash" Has had several contrast studies without ore medication at [facility]- and tolerated well.    Past Medical History:  Diagnosis Date  . Hypertension   . IVH (intraventricular hemorrhage) ([place]) 2018   due to HTN  . Prolactinoma ([place])    followed by [facility]  . Sleep apnea     Past Surgical History:  Procedure Laterality Date  . IR 3D INDEPENDENT WKST  09/19/2019  . IR ANGIO INTRA EXTRACRAN SEL COM CAROTID INNOMINATE BILAT MOD SED  09/18/2019  . IR ANGIO VERTEBRAL SEL SUBCLAVIAN INNOMINATE UNI L MOD SED  09/18/2019  . IR ANGIO VERTEBRAL SEL VERTEBRAL UNI R MOD SED  09/18/2019  . IR ANGIO VERTEBRAL SEL VERTEBRAL UNI R MOD SED  09/19/2019  . IR ANGIOGRAM FOLLOW UP STUDY  09/19/2019  . IR CT HEAD LTD  09/19/2019  . IR  IVC FILTER PLMT / S&I /IMG GUID/MOD SED  09/25/2019  . IR NEURO EACH ADD'L AFTER BASIC UNI RIGHT (MS)  09/19/2019  . IR RADIOLOGIST EVAL & MGMT  02/26/2020  . IR RADIOLOGIST EVAL & MGMT  06/03/2020  . IR TRANSCATH/EMBOLIZ  09/19/2019  . IR US GUIDE VASC ACCESS RIGHT  09/18/2019  . IR US GUIDE VASC ACCESS RIGHT  09/19/2019  . RADIOLOGY WITH ANESTHESIA N/A 09/19/2019   Procedure: RADIOLOGY WITH ANESTHESIA ANUERYSM COILING;  Surgeon: Pedro Earls, MD;  Location: [place];  Service: Radiology;  Laterality: N/A;  . TONSILLECTOMY      Allergies: Codeine and Shellfish allergy  Medications: Prior to Admission medications   Medication Sig Start Date End Date Taking? Authorizing Provider  aspirin EC 81 MG tablet Take 81 mg by mouth daily. Swallow whole.   Yes [provider]  cabergoline (DOSTINEX) 0.5 MG tablet Take 0.5 mg by mouth 2 (two) times a week. Saturday and wednesday 06/24/16  Yes [provider]  cloNIDine (CATAPRES - DOSED IN MG/24 HR) 0.3 mg/24hr patch Place 1 patch (0.3 mg total) onto the skin every 7 (seven) days. Change every Saturday 10/04/19  Yes Love, Ivan Anchors, PA-C  Grape Seed Extract 100 MG CAPS Take 100 mg by mouth in the morning and at bedtime.   Yes [provider]  hydrALAZINE (APRESOLINE) 100 MG tablet Take 100 mg by mouth 3 (three) times daily.   Yes [provider]  levETIRAcetam (KEPPRA) 1000 MG tablet Take 1 tablet (  1,000 mg total) by mouth 2 (two) times daily. 02/26/20  Yes McCue, Janett Billow, NP  levothyroxine (SYNTHROID) 100 MCG tablet Take 100 mcg by mouth daily before breakfast.   Yes [provider]  Misc Natural Products (PROSTATE HEALTH PO) Take 1 capsule by mouth daily. New Chapter   Yes [provider]  OVER THE COUNTER MEDICATION Take 1 capsule by mouth daily. Fungus Clean Pro   Yes [provider]  valsartan (DIOVAN) 160 MG tablet Take 160 mg by mouth daily.   Yes [provider]     Family  History  Problem Relation Age of Onset  . Colon cancer Father     Social History   Socioeconomic History  . Marital status: Married    Spouse name: Not on file  . Number of children: Not on file  . Years of education: Not on file  . Highest education level: Not on file  Occupational History  . Not on file  Tobacco Use  . Smoking status: Never Smoker  . Smokeless tobacco: Never Used  Substance and Sexual Activity  . Alcohol use: No  . Drug use: No  . Sexual activity: Not on file  Other Topics Concern  . Not on file  Social History Narrative  . Not on file   Social Determinants of Health   Financial Resource Strain: Not on file  Food Insecurity: Not on file  Transportation Needs: Not on file  Physical Activity: Not on file  Stress: Not on file  Social Connections: Not on file     Review of Systems: A 12 point ROS discussed and pertinent positives are indicated in the HPI above.  All other systems are negative.  Review of Systems  Constitutional: Negative for appetite change, fatigue and fever.  Respiratory: Negative for cough and shortness of breath.   Gastrointestinal: Negative for abdominal pain.  Psychiatric/Behavioral: Negative for behavioral problems and confusion.    Vital Signs: BP (!) 146/85   Pulse 73   Temp 98 F (36.7 C) (Oral)   Resp 18   Ht 5\' 11"  (1.803 m)   Wt (!) 380 lb (172.4 kg)   SpO2 98%   BMI 53.00 kg/m   Physical Exam Vitals reviewed.  HENT:     Mouth/Throat:     Mouth: Mucous membranes are moist.  Cardiovascular:     Rate and Rhythm: Normal rate and regular rhythm.     Heart sounds: Normal heart sounds.  Pulmonary:     Effort: Pulmonary effort is normal.     Breath sounds: Normal breath sounds.  Abdominal:     Palpations: Abdomen is soft.  Musculoskeletal:        General: Normal range of motion.  Skin:    General: Skin is warm.  Neurological:     Mental Status: He is alert and oriented to person, place, and time.   Psychiatric:        Behavior: Behavior normal.     Imaging: IR Radiologist Eval & Mgmt  Result Date: 06/03/2020 Please refer to notes tab for details about interventional procedure. (Op Note)   Labs:  CBC: Recent Labs    09/21/19 0511 09/22/19 0355 09/23/19 1251 09/30/19 1354  WBC 14.4* 13.5* 11.5* 9.6  HGB 12.3* 13.3 12.2* 11.9*  HCT 36.9* 39.3 35.6* 36.2*  PLT 271 268 225 225    COAGS: Recent Labs    09/19/19 0515  INR 1.0    BMP: Recent Labs    09/22/19 0355 09/23/19  1251 09/28/19 0438 09/30/19 1354  NA 133* 131* 140 138  K 4.3 4.1 3.5 4.1  CL 98 96* 99 100  CO2 23 25 31 29   GLUCOSE 128* 115* 105* 130*  BUN 15 17 16 16   CALCIUM 9.0 8.8* 9.1 9.5  CREATININE 0.84 0.80 0.76 1.03  GFRNONAA >60 >60 >60 >60  GFRAA >60 >60 >60 >60    LIVER FUNCTION TESTS: Recent Labs    09/30/19 1354  BILITOT 1.2  AST 26  ALT 17  ALKPHOS 52  PROT 7.1  ALBUMIN 3.3*    TUMOR MARKERS: No results for input(s): AFPTM, CEA, CA199, CHROMGRNA in the last 8760 hours.  Assessment and Plan:  Scheduled today for IVC filter retrieval Pt is aware of procedure benefits and risks Including but not limited to Infection; vessel damage; bleeding Pt is agreeable to proceed Consent signed and in chart  Thank you for this interesting consult.  I greatly enjoyed meeting ADAM SANJUAN and look forward to participating in their care.  A copy of this report was sent to the requesting provider on this date.  Electronically Signed: Lavonia Drafts, PA-C 06/15/2020, 10:53 AM   I spent a total of    25 Minutes in face to face in clinical consultation, greater than 50% of which was counseling/coordinating care for IVC filter retrieval

## 2020-06-15 NOTE — Procedures (Signed)
  Procedure: IVC gram and filter removal   EBL:   minimal Complications:  none immediate  See full dictation in [employer].  Dillard Cannon MD Main # [phone] Pager  [phone] Mobile [phone]

## 2020-06-15 NOTE — Progress Notes (Signed)
Ambulated to the bathroom to void tol well  

## 2020-08-27 ENCOUNTER — Ambulatory Visit: Payer: 59 | Admitting: Adult Health

## 2020-08-27 ENCOUNTER — Encounter: Payer: Self-pay | Admitting: Adult Health

## 2020-08-27 VITALS — BP 155/83 | HR 56 | Ht 71.0 in | Wt 388.0 lb

## 2020-08-27 DIAGNOSIS — E782 Mixed hyperlipidemia: Secondary | ICD-10-CM

## 2020-08-27 DIAGNOSIS — I69398 Other sequelae of cerebral infarction: Secondary | ICD-10-CM | POA: Diagnosis not present

## 2020-08-27 DIAGNOSIS — I615 Nontraumatic intracerebral hemorrhage, intraventricular: Secondary | ICD-10-CM

## 2020-08-27 DIAGNOSIS — I671 Cerebral aneurysm, nonruptured: Secondary | ICD-10-CM

## 2020-08-27 DIAGNOSIS — Z95828 Presence of other vascular implants and grafts: Secondary | ICD-10-CM

## 2020-08-27 DIAGNOSIS — I6350 Cerebral infarction due to unspecified occlusion or stenosis of unspecified cerebral artery: Secondary | ICD-10-CM | POA: Diagnosis not present

## 2020-08-27 DIAGNOSIS — R569 Unspecified convulsions: Secondary | ICD-10-CM

## 2020-08-27 DIAGNOSIS — I10 Essential (primary) hypertension: Secondary | ICD-10-CM

## 2020-08-27 NOTE — Progress Notes (Signed)
[facility] [address] [place]. [place] [address] [phone]       STROKE FOLLOW UP NOTE  [name] Date of Birth:  12/31/1965 Medical Record Number:  [id number]   Reason for Referral: stroke follow up    SUBJECTIVE:   CHIEF COMPLAINT:  Chief Complaint  Patient presents with  . Follow-up    TR with wife (Andrew Perez) Pt is well and stable, no new symptoms or complications.     HPI:   Today, 08/27/2020, Andrew Perez returns for 55-month stroke follow-up accompanied by his wife.  He has been doing well since prior visit with excellent improvement of his gait and no longer using any type of assistive device.  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  He has returned back to driving and maintaining ADLs and IADLs independently without difficulty.  Remains on Keppra 1000 mg twice daily tolerating without any reoccurring seizure activity.  Remains on aspirin without associated side effects.  Blood pressure today 155/83 - does not routinely monitor at home as typically stable.  Has since had follow-up with Neurosurgeon Dr. Eliberto Ivory for history of aneurysm s/p embolization with recent imaging stable and plans on repeating imaging in 03/2021.  No further concerns at this time.   History provided for reference purposes only Update 02/26/2020 JM: Andrew Perez returns for stroke and seizure follow-up accompanied by his wife.  Stable from stroke standpoint with residual gait impairment and occasional dizziness/vertigo which has been slowly improving.  He continues to use RW for ambulation.  Denies new or worsening stroke/TIA symptoms.  After prior visit, wife called office the following day for 2 seizure occurrences and advised to increase Keppra dose to 750 mg twice daily.  Recent episode approx 3 weeks ago of right hand numbness, slurred speech  and then lost consciousness for approx 15-30 seconds. No postictal symptoms.  Transported to [facility] ED and found to have pleural effusion and PNA. He was working with therapies but placed on hold d/t recent admission. He is currently awaiting to be released by PCP for return to therapies.  At some point, Keppra dosage further increased to 1000 mg twice daily but unsure when this occurred.  He has remained on Keppra 1000 mg twice daily without side effects. Remains on aspirin 325 mg daily without bleeding or bruising. He has since stopped atorvastatin due to GI irritation with resolution.  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  No further concerns at this time.  Initial visit 11/04/2019 JM: Andrew Perez is being seen for hospital follow-up accompanied by his wife.  Discharged home from [place] on 10/04/2019 with uneventful stay.  Residual deficits of cognitive impairment and imbalance as well as occasional difficulty swallowing chewy or hard foods.  He is frustrated regarding continued cognitive impairment and lack of improvement.  Initially participating in outpatient therapy at [facility] but placed on hold due to seizure type event during therapy on 10/25/2019 evaluated at [facility] ED.  Event consisted of generalized feeling of not feeling well and became diaphoretic and then progressed to staring and unresponsive and then lost consciousness for approximately 1 minute.  No postictal state per wife.  Unable to view hospital notes via epic.  Initiated Keppra 500  mg twice daily without recurrent seizure type activity.  Majority of antihypertensives discontinued except for clonidine.  Per wife, history of episodes of not feeling well and becoming diaphoretic but never further progress with typical trigger of increased heat.  Does complain of diarrhea since starting with needs of daily Imodium.   Unsure if diarrhea has improved as they have not trialed holding Imodium.  Plans on restarting outpatient therapy tomorrow -wife is unsure if he is participating in speech therapy.  Ambulates with rolling walker and denies any recent falls.  Denies new or worsening stroke/TIA symptoms.  Continues on aspirin 325 mg daily and atorvastatin 40 mg daily without side effects.  Blood pressure today 140/79.  Recent follow-up with endocrinology and has not had follow-up with neurosurgery since discharge.  No further concerns at this time.  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 SBP > 190 upon arrival.  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  Underwent right PICA aneurysm embolization with sacrifice of PICA segment.  Initiated aspirin 325 mg daily for stroke prevention.  Evidence of DVT left posterior tibial vein and placement of IVC filter as he is not anticoagulant candidate given IVH.  History of HTN found in hypertensive emergency with adjustments to antihypertensive regimen and long-term BP goal normotensive range.  LDL 104 initiate atorvastatin 40 mg daily.  Other stroke risk factors include morbid obesity, prolactinoma and OSA.  Evaluated by therapies and recommended discharge to CIR for ongoing therapy needs.  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 small volume IVH in 4th  ventricle. Interval resection previous central skull base infiltrative mass. 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 No hydrocephalus. Known sellar mass w/ skull base erosion.   Cerebral angio right PICA 47mm aneurysm   MRI -Evolving bilateral PICA territory infarct, similar in extension to prior CT. There is no hydrocephalus.   Repeat CT head 6/8 /21 evolving nonhemorrhagic cerebral infarcts right greater than left   2D EchoEF 55-60%. No source of embolus. LA moderate dilated  LDL104  HgbA1c4.7  UDS + opiates  SCDs for VTE prophylaxis  No antithromboticprior to admission, was on No antithromboticgiven hemorrhage. Post aneurysm embolization, now off heparin IV, on ASA 325. Will not use DAPT given hemorrhage   Therapy recommendations: CIR  Disposition: CIR      ROS:   14 system review of systems performed and negative with exception of no complaints  PMH:  Past Medical History:  Diagnosis Date  . Hypertension   . IVH (intraventricular hemorrhage) ([place]) 2018   due to HTN  . Prolactinoma ([place])    followed by [facility]  . Sleep apnea     PSH:  Past Surgical History:  Procedure Laterality Date  . IR 3D INDEPENDENT WKST  09/19/2019  . IR ANGIO INTRA EXTRACRAN SEL COM CAROTID INNOMINATE BILAT MOD SED  09/18/2019  . IR ANGIO VERTEBRAL SEL SUBCLAVIAN  INNOMINATE UNI L MOD SED  09/18/2019  . IR ANGIO VERTEBRAL SEL VERTEBRAL UNI R MOD SED  09/18/2019  . IR ANGIO VERTEBRAL SEL VERTEBRAL UNI R MOD SED  09/19/2019  . IR ANGIOGRAM FOLLOW UP STUDY  09/19/2019  . IR CT HEAD LTD  09/19/2019  . IR IVC FILTER PLMT / S&I /IMG GUID/MOD SED  09/25/2019  . IR IVC FILTER RETRIEVAL / S&I /IMG GUID/MOD SED  06/15/2020  . IR NEURO EACH ADD'L AFTER BASIC UNI RIGHT (MS)  09/19/2019  . IR RADIOLOGIST EVAL & MGMT  02/26/2020  . IR RADIOLOGIST EVAL & MGMT  06/03/2020  . IR TRANSCATH/EMBOLIZ  09/19/2019  . IR US GUIDE VASC ACCESS RIGHT  09/18/2019  . IR US GUIDE  VASC ACCESS RIGHT  09/19/2019  . IR US GUIDE VASC ACCESS RIGHT  06/15/2020  . RADIOLOGY WITH ANESTHESIA N/A 09/19/2019   Procedure: RADIOLOGY WITH ANESTHESIA ANUERYSM COILING;  Surgeon: Pedro Earls, MD;  Location: [place];  Service: Radiology;  Laterality: N/A;  . TONSILLECTOMY      Social History:  Social History   Socioeconomic History  . Marital status: Married    Spouse name: Not on file  . Number of children: Not on file  . Years of education: Not on file  . Highest education level: Not on file  Occupational History  . Not on file  Tobacco Use  . Smoking status: Never Smoker  . Smokeless tobacco: Never Used  Substance and Sexual Activity  . Alcohol use: No  . Drug use: No  . Sexual activity: Not on file  Other Topics Concern  . Not on file  Social History Narrative  . Not on file   Social Determinants of Health   Financial Resource Strain: Not on file  Food Insecurity: Not on file  Transportation Needs: Not on file  Physical Activity: Not on file  Stress: Not on file  Social Connections: Not on file  Intimate Partner Violence: Not on file    Family History:  Family History  Problem Relation Age of Onset  . Colon cancer Father     Medications:   Current Outpatient Medications on File Prior to Visit  Medication Sig Dispense Refill  . aspirin EC 81 MG tablet Take 81 mg by mouth daily. Swallow whole.    . cabergoline (DOSTINEX) 0.5 MG tablet Take 0.5 mg by mouth 2 (two) times a week. Saturday and wednesday  0  . cloNIDine (CATAPRES - DOSED IN MG/24 HR) 0.3 mg/24hr patch Place 1 patch (0.3 mg total) onto the skin every 7 (seven) days. Change every Saturday 4 patch 12  . Grape Seed Extract 100 MG CAPS Take 100 mg by mouth in the morning and at bedtime.    . hydrALAZINE (APRESOLINE) 100 MG tablet Take 100 mg by mouth 3 (three) times daily.    Marland Kitchen levETIRAcetam (KEPPRA) 1000 MG tablet Take 1 tablet (1,000 mg total) by mouth 2 (two) times daily. 180 tablet  3  . levothyroxine (SYNTHROID) 100 MCG tablet Take 100 mcg by mouth daily before breakfast.    . Misc Natural Products (PROSTATE HEALTH PO) Take 1 capsule by mouth daily. New Chapter    . valsartan (DIOVAN) 160 MG tablet Take 160 mg by mouth daily.     No current facility-administered medications on file prior to visit.    Allergies:   Allergies  Allergen Reactions  . Codeine Shortness Of Breath  . Shellfish Allergy Shortness Of Breath  OBJECTIVE:  Physical Exam  Vitals:   08/27/20 1036  BP: (!) 155/83  Pulse: (!) 56  Weight: (!) 388 lb (176 kg)  Height: 5\' 11"  (1.803 m)   Body mass index is 54.12 kg/m. No exam data present  General: Morbidly obese very pleasant middle-age Caucasian male, seated, in no evident distress Head: head normocephalic and atraumatic.   Neck: supple with no carotid or supraclavicular bruits Cardiovascular: regular rate and rhythm, no murmurs Musculoskeletal: no deformity Skin:  no rash/petichiae Vascular:  Normal pulses all extremities   Neurologic Exam Mental Status: Awake and fully alert.   Fluent speech and language.  Oriented to place and time. Recent memory and remote memory intact. Attention span, concentration and fund of knowledge appropriate during visit. Mood and affect flat.  Cranial Nerves: Pupils equal, briskly reactive to light. Extraocular movements full without nystagmus. Visual fields full to confrontation. Hearing intact. Facial sensation intact. Face, tongue, palate moves normally and symmetrically.  Motor: Normal bulk and tone. Normal strength in all tested extremity muscles. Sensory.: intact to touch , pinprick , position and vibratory sensation.  Coordination: Rapid alternating movements normal in all extremities. Finger-to-nose performed accurately bilaterally and heel-to-shin difficulty performing Gait and Station: Arises from chair without difficulty. Stance is normal. Gait demonstrates  normal stride length and  balance without use of assistive device.  Tandem walk with mild difficulty. Reflexes: 1+ and symmetric. Toes downgoing.        ASSESSMENT/PLAN: Andrew Perez is a 55 y.o. year old male presented with dizziness with episode of vomiting followed by headache and hypertensive urgency on 09/17/2019 with stroke work-up revealing bilateral PICA territory infarcts and small fourth ventricle IVH in setting of dissecting right PICA aneurysm and hypertensive emergency. S/p R PICA aneurysm embolization with sacrifice of the right PICA at telovelotonsillar segment 6/3.  Seizure-like episode 10/25/2019 evaluated at [facility].  Vascular risk factors include HTN, HLD, IVH 2018, prolactinoma dx 06/2016, morbid obesity, acute DVT s/p IVC filter and OSA on CPAP.     1. 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 Recovered well without residual stroke deficits b. Continue aspirin 325 mg daily for secondary stroke prevention.  Advised to follow-up with PCP in regards to restarting statin once able for secondary stroke prevention c. Discussed secondary stroke prevention measures and importance of close PCP follow-up for aggressive stroke risk factor management 2. Seizure, late effect of stroke:  a. Likely secondary to recent stroke and IVH as well as known prolactinoma.   b. Initial seizure activity 10/2019 and reoccurring 02/02/2020 likely in setting of pleural effusion and pneumonia with no additional events since that time c. Continue Keppra 1000 mg twice daily for seizure prophylaxis -refill up-to-date d. Discussed importance of avoiding seizure triggers and to call office with any reoccurring seizure activity or symptoms.   3. Dissecting R PICA aneurysm s/p embolization: Continue to follow neurosurgery Dr. Eliberto Ivory with plans on repeating imaging 03/2021 as imaging in 03/2020 stable 4. LLE acute DVT s/p IVC filter: Not a candidate for anticoagulation.  Continue to follow with [facility] for monitoring and  management 5. HTN: BP goal<130/90.  Stable today.  Managed by PCP 6. HLD: LDL goal<70.  Intolerant to statins.  Request routine follow-up with PCP for lipid panel monitoring and to initiate cholesterol-lowering agent if LDL>70 7. Prolactinoma: Continue to follow with [facility] endocrinology and neurosurgery for routine monitoring and management    Follow up in 6 months or call earlier if needed He  plans on moving in June but unsure of location - advised to call office if a referral is needed to transfer care if his new location is too far from office    CC:  [place] provider: Dr. Ander Purpura, Sharon Mt, MD    Frann Rider, [facility]  [facility] [address] [place], [place] [address]  Phone [phone] Fax [phone] Note: This document was prepared with digital dictation and possible smart phrase technology. Any transcriptional errors that result from this process are unintentional.

## 2020-08-27 NOTE — Patient Instructions (Signed)
Continue keppra 1000mg  twice daily for seizure prevention  Continue aspirin 81 mg daily for secondary stroke prevention  Continue to follow up with PCP regarding blood pressure management  Maintain strict control of hypertension with blood pressure goal below 130/90 and cholesterol with LDL cholesterol (bad cholesterol) goal below 70 mg/dL.      Followup in the future with me in 6 months or call earlier if needed     Thank you for coming to see [place] at [facility]. I hope we have been able to provide you high quality care today.  You may receive a patient satisfaction survey over the next few weeks. We would appreciate your feedback and comments so that we may continue to improve ourselves and the health of our patients.

## 2020-08-27 NOTE — Progress Notes (Signed)
I agree with the above plan 

## 2021-03-10 ENCOUNTER — Telehealth (INDEPENDENT_AMBULATORY_CARE_PROVIDER_SITE_OTHER): Payer: Self-pay | Admitting: Adult Health

## 2021-03-10 ENCOUNTER — Encounter: Payer: Self-pay | Admitting: Adult Health

## 2021-03-10 DIAGNOSIS — I6350 Cerebral infarction due to unspecified occlusion or stenosis of unspecified cerebral artery: Secondary | ICD-10-CM

## 2021-03-10 DIAGNOSIS — R569 Unspecified convulsions: Secondary | ICD-10-CM

## 2021-03-10 DIAGNOSIS — I615 Nontraumatic intracerebral hemorrhage, intraventricular: Secondary | ICD-10-CM

## 2021-03-10 DIAGNOSIS — I69398 Other sequelae of cerebral infarction: Secondary | ICD-10-CM

## 2021-03-10 MED ORDER — LEVETIRACETAM 1000 MG PO TABS: 1000.0000 mg | ORAL_TABLET | Freq: Two times a day (BID) | ORAL | 3 refills | Status: DC

## 2021-03-10 MED ORDER — LEVETIRACETAM 1000 MG PO TABS
1000.0000 mg | ORAL_TABLET | Freq: Two times a day (BID) | ORAL | 3 refills | Status: DC
Start: 2021-03-10 — End: 2021-03-10

## 2021-03-10 NOTE — Progress Notes (Signed)
[facility] [address] [place]. [place] [address] [phone]       STROKE FOLLOW UP NOTE  Andrew Perez Date of Birth:  February 15, 1966 Medical Record Number:  [id number]   Reason for Referral: stroke and seizure follow up  Virtual Visit via Video Note  Virtual visit completed through [place], a video enabled telemedicine application. Due to national recommendations of social distancing due to COVID-19, a virtual visit is felt to be most appropriate for this patient at this time. Reviewed limitations, risks, security and privacy concerns of performing a virtual visit and the availability of in person appointments. I also reviewed that there may be a patient responsible charge related to this service. The patient agreed to proceed.    Patient location: home Provider location: in office, [facility] Persons participating in this virtual visit: patient and wife       SUBJECTIVE:    HPI:    Update 03/10/2021 JM: Returns for 56-month stroke and seizure follow-up via MyChart video visit as he has since moved to [place], [place] for job reasons - he has not yet established care with new neurology group  Overall stable -denies new stroke/TIA symptoms or additional seizure activity or symptoms Compliant on aspirin -denies side effects Compliant on Keppra 1000 mg twice daily -denies side effects Continues to maintain ADLs and IADLs independently Blood pressure occasionally monitored at home which has been stable  Scheduled f/u with Dr. Rogers Blocker in 05/10/2021 Scheduled f/u with Dr. Hermelinda Dellen (endocrinology) on 03/29/2021 He plans on discussing ongoing refills for Keppra with Dr. Hermelinda Dellen (to help consolidate care)  No new concerns at this time    History provided for reference purposes only Update 08/27/2020 JM: Andrew Perez returns for 75-month stroke follow-up accompanied by his wife.  He has been doing well since prior visit with excellent improvement of his  gait and no longer using any type of assistive device.  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  He has returned back to driving and maintaining ADLs and IADLs independently without difficulty.  Remains on Keppra 1000 mg twice daily tolerating without any reoccurring seizure activity.  Remains on aspirin without associated side effects.  Blood pressure today 155/83 - does not routinely monitor at home as typically stable.  Has since had follow-up with Neurosurgeon Dr. Eliberto Ivory for history of aneurysm s/p embolization with recent imaging stable and plans on repeating imaging in 03/2021.  No further concerns at this time.  Update 02/26/2020 JM: Andrew Perez returns for stroke and seizure follow-up accompanied by his wife.  Stable from stroke standpoint with residual gait impairment and occasional dizziness/vertigo which has been slowly improving.  He continues to use RW for ambulation.  Denies new or worsening stroke/TIA symptoms.  After prior visit, wife called office the following day for 2 seizure occurrences and advised to increase Keppra dose to 750 mg twice daily.  Recent episode approx 3 weeks ago of right hand numbness, slurred speech and then lost consciousness for approx 15-30 seconds. No postictal symptoms.  Transported to [facility] ED and found to have pleural effusion and PNA. He was working with therapies but placed on hold d/t recent admission. He is currently awaiting to be released by PCP for return to therapies.  At some point, Keppra dosage further increased to 1000 mg twice  daily but unsure when this occurred.  He has remained on Keppra 1000 mg twice daily without side effects. Remains on aspirin 325 mg daily without bleeding or bruising. He has since stopped atorvastatin due to GI irritation with resolution.  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  No further concerns at this time.  Initial visit 11/04/2019 JM: Andrew Perez is being seen for hospital follow-up accompanied by his wife.  Discharged home from [place] on 10/04/2019 with uneventful stay.  Residual deficits of cognitive impairment and imbalance as well as occasional difficulty swallowing chewy or hard foods.  He is frustrated regarding continued cognitive impairment and lack of improvement.  Initially participating in outpatient therapy at [facility] but placed on hold due to seizure type event during therapy on 10/25/2019 evaluated at [facility] ED.  Event consisted of generalized feeling of not feeling well and became diaphoretic and then progressed to staring and unresponsive and then lost consciousness for approximately 1 minute.  No postictal state per wife.  Unable to view hospital notes via epic.  Initiated Keppra 500 mg twice daily without recurrent seizure type activity.  Majority of antihypertensives discontinued except for clonidine.  Per wife, history of episodes of not feeling well and becoming diaphoretic but never further progress with typical trigger of increased heat.  Does complain of diarrhea since starting with needs of daily Imodium.  Unsure if diarrhea has improved as they have not trialed holding Imodium.  Plans on restarting outpatient therapy tomorrow -wife is unsure if he is participating in speech therapy.  Ambulates with rolling walker and denies any recent falls.  Denies new or worsening stroke/TIA symptoms.  Continues on aspirin 325 mg daily and atorvastatin 40 mg daily without side effects.  Blood pressure today 140/79.  Recent follow-up with endocrinology and has not had follow-up with neurosurgery since discharge.  No further concerns at this time.  Stroke admission 09/17/2019 Andrew Perez is a 55 y.o. 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 SBP > 190 upon arrival.  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  Underwent right PICA aneurysm embolization with sacrifice of PICA segment.  Initiated aspirin 325 mg daily for stroke prevention.  Evidence of DVT left posterior tibial vein and placement of IVC filter as he is not anticoagulant candidate given IVH.  History of HTN found in hypertensive emergency with adjustments to antihypertensive regimen and long-term BP goal normotensive range.  LDL 104 initiate atorvastatin 40 mg daily.  Other stroke risk factors include morbid obesity, prolactinoma and OSA.  Evaluated by therapies and recommended discharge to CIR for ongoing therapy needs.  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 small volume IVH in 4th ventricle. Interval resection previous central skull base infiltrative mass. Chronic R mastoid and middle ear effusion CT head 6/2 no increase in hemorrhage. No hydrocephalus. Known sellar mass w/ skull base erosion.  Cerebral  angio right PICA 42mm aneurysm  MRI - Evolving bilateral PICA territory infarct, similar in extension to prior CT. There is no hydrocephalus.  Repeat CT head 6/8 /21 evolving nonhemorrhagic cerebral infarcts right greater than left  2D Echo EF 55-60%. No source of embolus. LA moderate dilated LDL 104 HgbA1c 4.7 UDS + opiates SCDs for VTE prophylaxis No antithrombotic prior to admission, was on No  antithrombotic given hemorrhage. Post aneurysm embolization, now off heparin IV, on ASA 325. Will not use DAPT given hemorrhage  Therapy recommendations:  CIR Disposition:  CIR      ROS:   14 system review of systems performed and negative with exception of no complaints  PMH:  Past Medical History:  Diagnosis Date   Hypertension    IVH (intraventricular hemorrhage) ([place]) 2018   due to HTN   Prolactinoma ([place])    followed by [facility]   Sleep apnea     PSH:  Past Surgical History:  Procedure Laterality Date   IR 3D INDEPENDENT WKST  09/19/2019   IR ANGIO INTRA EXTRACRAN SEL COM CAROTID INNOMINATE BILAT MOD SED  09/18/2019   IR ANGIO VERTEBRAL SEL SUBCLAVIAN INNOMINATE UNI L MOD SED  09/18/2019   IR ANGIO VERTEBRAL SEL VERTEBRAL UNI R MOD SED  09/18/2019   IR ANGIO VERTEBRAL SEL VERTEBRAL UNI R MOD SED  09/19/2019   IR ANGIOGRAM FOLLOW UP STUDY  09/19/2019   IR CT HEAD LTD  09/19/2019   IR IVC FILTER PLMT / S&I /IMG GUID/MOD SED  09/25/2019   IR IVC FILTER RETRIEVAL / S&I Burke Keels GUID/MOD SED  06/15/2020   IR NEURO EACH ADD'L AFTER BASIC UNI RIGHT (MS)  09/19/2019   IR RADIOLOGIST EVAL & MGMT  02/26/2020   IR RADIOLOGIST EVAL & MGMT  06/03/2020   IR TRANSCATH/EMBOLIZ  09/19/2019   IR US GUIDE VASC ACCESS RIGHT  09/18/2019   IR US GUIDE VASC ACCESS RIGHT  09/19/2019   IR US GUIDE VASC ACCESS RIGHT  06/15/2020   RADIOLOGY WITH ANESTHESIA N/A 09/19/2019   Procedure: RADIOLOGY WITH ANESTHESIA ANUERYSM COILING;  Surgeon: Pedro Earls, MD;  Location: [facility];  Service: Radiology;  Laterality: N/A;   TONSILLECTOMY      Social History:  Social History   Socioeconomic History   Marital status: Married    Spouse name: Not on file   Number of children: Not on file   Years of education: Not on file   Highest education level: Not on file  Occupational History   Not on file  Tobacco Use   Smoking status: Never   Smokeless tobacco: Never  Substance and Sexual Activity   Alcohol use: No    Drug use: No   Sexual activity: Not on file  Other Topics Concern   Not on file  Social History Narrative   Not on file   Social Determinants of Health   Financial Resource Strain: Not on file  Food Insecurity: Not on file  Transportation Needs: Not on file  Physical Activity: Not on file  Stress: Not on file  Social Connections: Not on file  Intimate Partner Violence: Not on file    Family History:  Family History  Problem Relation Age of Onset   Colon cancer Father     Medications:   Current Outpatient Medications on File Prior to Visit  Medication Sig Dispense Refill   aspirin EC 81 MG tablet Take 81 mg by mouth daily. Swallow whole.     cabergoline (  DOSTINEX) 0.5 MG tablet Take 0.5 mg by mouth 2 (two) times a week. Saturday and wednesday  0   cloNIDine (CATAPRES - DOSED IN MG/24 HR) 0.3 mg/24hr patch Place 1 patch (0.3 mg total) onto the skin every 7 (seven) days. Change every Saturday 4 patch 12   Grape Seed Extract 100 MG CAPS Take 100 mg by mouth in the morning and at bedtime.     hydrALAZINE (APRESOLINE) 100 MG tablet Take 100 mg by mouth 3 (three) times daily.     levETIRAcetam (KEPPRA) 1000 MG tablet Take 1 tablet (1,000 mg total) by mouth 2 (two) times daily. 180 tablet 3   levothyroxine (SYNTHROID) 100 MCG tablet Take 100 mcg by mouth daily before breakfast.     Misc Natural Products (PROSTATE HEALTH PO) Take 1 capsule by mouth daily. [place]     valsartan (DIOVAN) 160 MG tablet Take 160 mg by mouth daily.     No current facility-administered medications on file prior to visit.    Allergies:   Allergies  Allergen Reactions   Codeine Shortness Of Breath   Shellfish Allergy Shortness Of Breath      OBJECTIVE:  Physical Exam General: well developed, well nourished, pleasant middle-age Caucasian male, seated, in no evident distress Head: head normocephalic and atraumatic.    Neurologic Exam Mental Status: Awake and fully alert. Oriented to place  and time. Recent and remote memory intact. Attention span, concentration and fund of knowledge appropriate. Mood and affect appropriate.  Cranial Nerves: Hearing intact to voice. Face, tongue, palate moves normally and symmetrically.  Shoulder shrug symmetric. Motor: No evidence of weakness per drift assessment Coordination: Rapid alternating movements normal in all extremities. Finger-to-nose and heel-to-shin performed accurately bilaterally.        ASSESSMENT/PLAN: Andrew Perez is a 55 y.o. year old male presented with dizziness with episode of vomiting followed by headache and hypertensive urgency on 09/17/2019 with stroke work-up revealing bilateral PICA territory infarcts and small fourth ventricle IVH in setting of dissecting right PICA aneurysm and hypertensive emergency. S/p R PICA aneurysm embolization with sacrifice of the right PICA at telovelotonsillar segment 6/3.  Seizure-like episode 10/25/2019 evaluated at [facility].  Vascular risk factors include HTN, HLD, IVH 2018, prolactinoma dx 06/2016, morbid obesity, acute DVT s/p IVC filter and OSA on CPAP.     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   Initial seizure activity 10/2019 and reoccurring 02/02/2020 likely in setting of pleural effusion and pneumonia with no additional events since that time Continue Keppra 1000 mg twice daily for seizure prophylaxis -refill provided Discussed importance of avoiding seizure triggers and to call office with any reoccurring seizure activity or symptoms.   Dissecting R PICA aneurysm s/p  embolization: Followed by neurosurgery Dr. Rogers Blocker Prolactinoma: Continue to follow with [facility] endocrinology and neurosurgery for routine monitoring and management   If he needs our assistance for ongoing refills of Keppra, can follow up in 1 year otherwise can follow up as needed as he has since moved to [place], [place]  CC:  Street, Sharon Mt, MD    I spent 22 minutes of face-to-face  and non-face-to-face time with patient via MyChart video visit.  This included previsit chart review, lab review, study review, order entry, electronic health record documentation, patient education and discussion regarding history of prior stroke, history of seizures and ongoing use with seizure preventative medications and avoidance of seizure triggers and answered all other questions to patient satisfaction   Frann Rider, [facility]  [facility] [address] [place], [place] [address]  Phone [phone] Fax [phone] Note: This document was prepared with digital dictation and possible smart phrase technology. Any transcriptional errors that result from this process are unintentional.

## 2021-05-12 ENCOUNTER — Telehealth: Payer: Self-pay | Admitting: Adult Health

## 2021-05-12 NOTE — Telephone Encounter (Signed)
This is FYI for [place], NP pt will not be coming back to [place].  He has moved and is further away.  Pt also states he had 2 MRI's on Monday of this week (1 with and 1 without contrast) they were both so good that he does not have to return for another 2 years.  No request for a call back.

## 2022-02-28 IMAGING — XA IR TRANSCATH EMBOLIZATION
11 of 14 series · 12 of 24 positions shown · IV contrast (IODINE)
Comparison: Diagnostic cerebral angiogram 09/18/2019

INDICATION: 53-year-old male with past medical history significant for
hypertension, intraventricular hemorrhage in the fourth ventricle in
1487, prolactinoma on cabergoline and morbid obesity. He presented
with acute onset of posterior headache, dizziness and vomiting on
09/17/2019. Head CT showed recurrent for ventricular hemorrhage
without hydrocephalus. Diagnostic cerebral angiogram performed on
09/18/2019 showed a dissected right PICA aneurysm, most likely the
source of bleed.

EXAM:
DIAGNOSTIC CEREBRAL ANGIOGRAM AND ENDOVASCULAR EMBOLIZATION
TECHNIQUE: Informed written consent was obtained from the patient after a
thorough discussion of the procedural risks, benefits and
alternatives. All questions were addressed. Maximal Sterile Barrier
Technique was utilized including caps, mask, sterile gowns, sterile
gloves, sterile drape, hand hygiene and skin antiseptic. A timeout
was performed prior to the initiation of the procedure.

[Series 1: fl neuro n · 1 of 41 frames shown]
[frame 38/41  full-range]
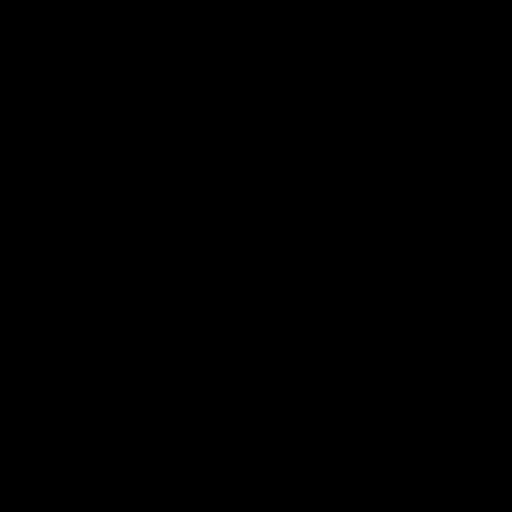

[Series 3: cerebral care 2 · 4 acquisitions, 1 frame shown]
[im 1/4]
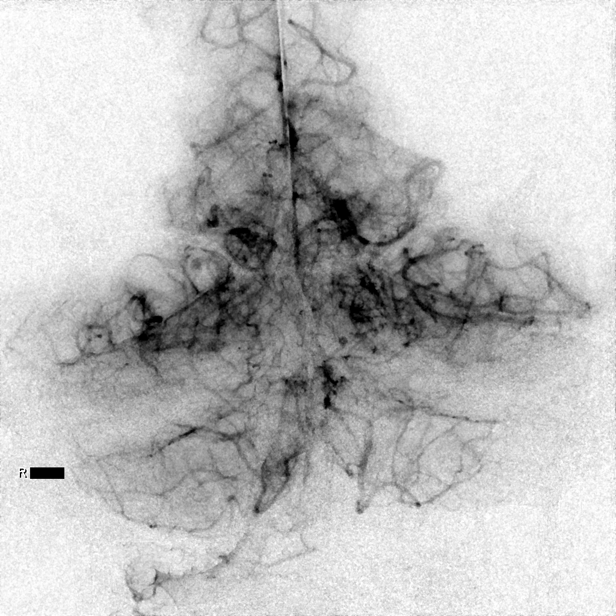

[Series 4: dr. (person_name) (id) head · 2 acquisitions, 1 frame shown]
[im 1/2]
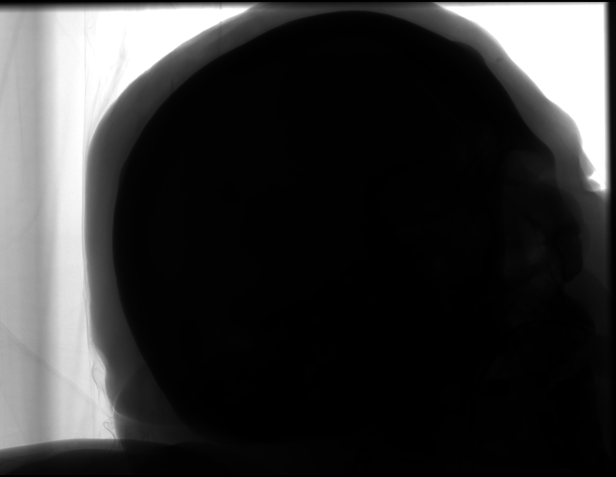

[Series 6: cerebral · 4 acquisitions, 1 frame shown (1 of 6)]
[im 1/4]
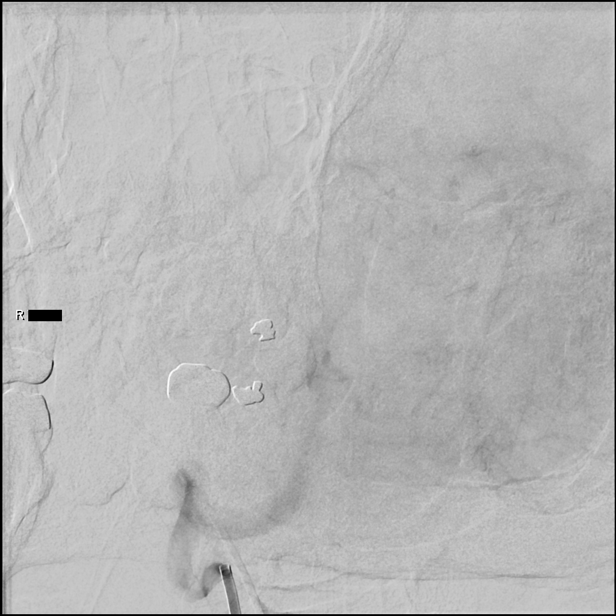

[Series 8: cerebral · 4 acquisitions, 1 frame shown (2 of 6)]
[im 1/4]
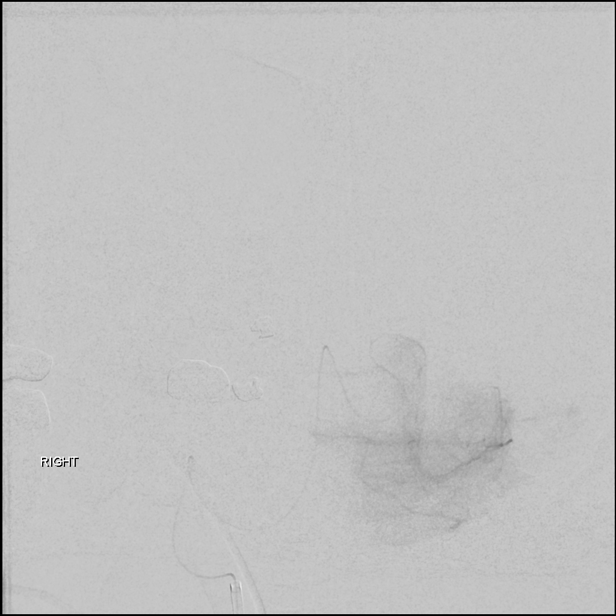

[Series 9: cerebral · 4 acquisitions, 1 frame shown (3 of 6)]
[im 1/4]
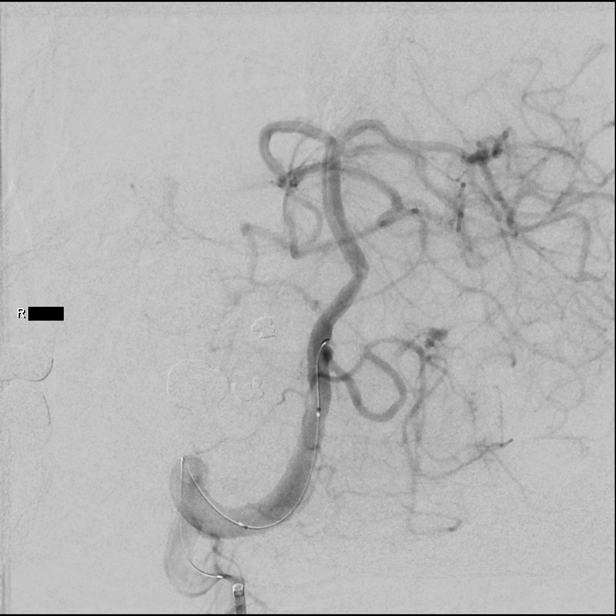

[Series 10: cerebral · 4 acquisitions, 1 frame shown (4 of 6)]
[im 1/4]
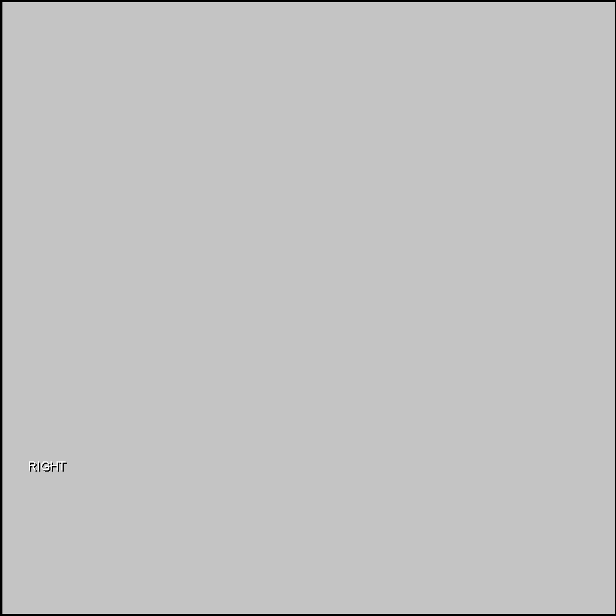

[Series 12: cerebral · 4 acquisitions, 1 frame shown (5 of 6)]
[im 1/4]
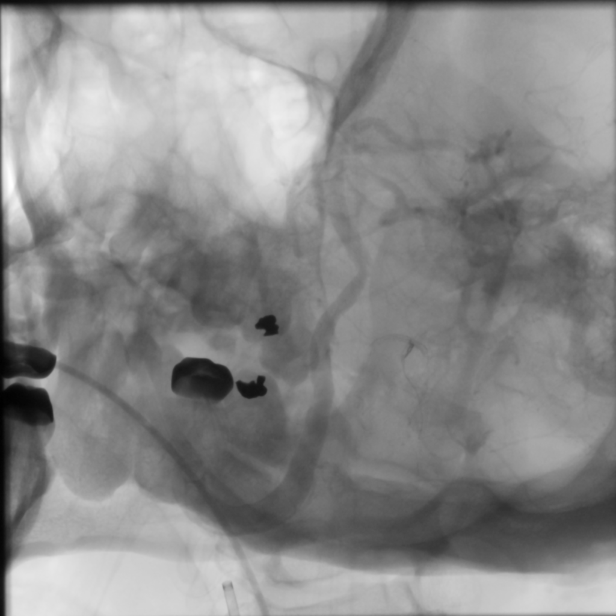

[Series 15: cerebral · 4 acquisitions, 1 frame shown (6 of 6)]
[im 1/4]
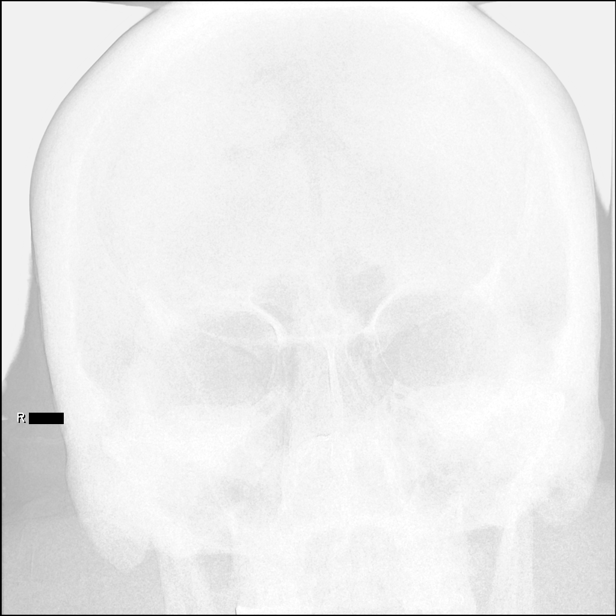

[Series 17: fl neuro · 1 of 77 frames shown]
[frame 39/77]
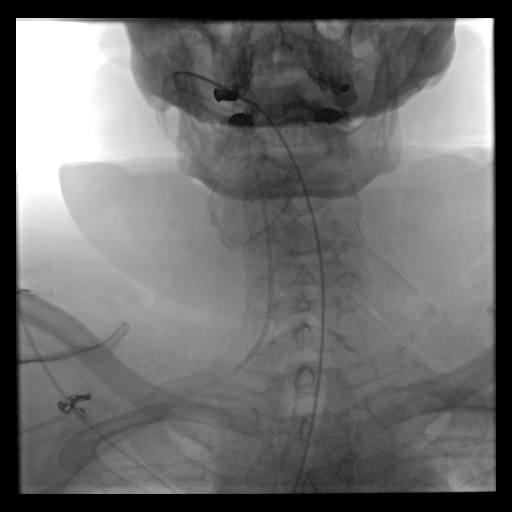

[Series 300: dr. (person_name) · 2 of 46 slices shown]
[im 17/46]
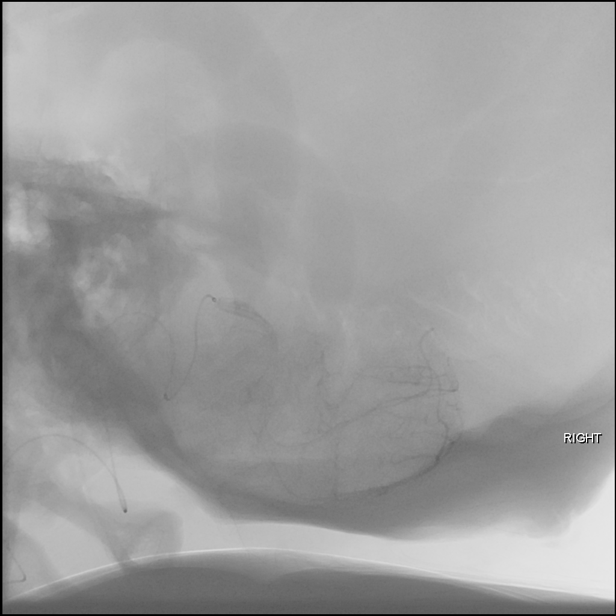
[im 46/46]
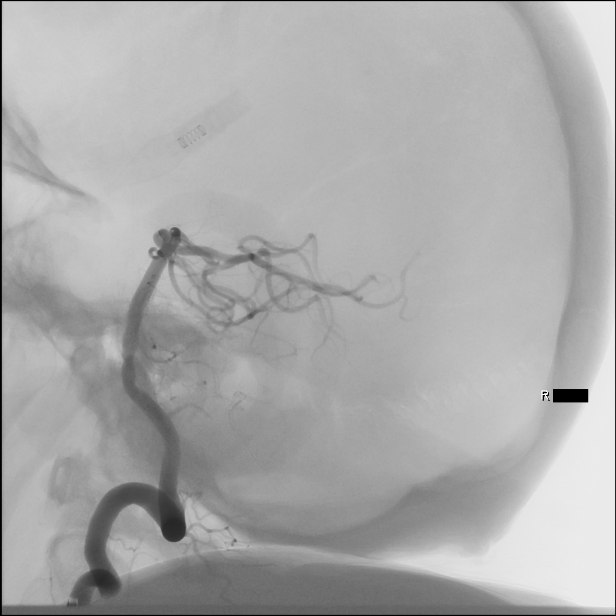

[12 of 24 positions shown; findings below may reference images not displayed]

MEDICATIONS:
No antibiotic administered.

ANESTHESIA/SEDATION:
The procedure was performed under general anesthesia.

FLUOROSCOPY TIME:  Fluoroscopy Time: 74 minutes 42 seconds (9330
mGy).

COMPLICATIONS:
SIR LEVEL B - Normal therapy, includes overnight admission for
observation.
Real-time ultrasound guidance was utilized for vascular access
including the acquisition of a permanent ultrasound image
documenting patency of the accessed vessel.

Using the modified Seldinger technique and a micropuncture kit,
access was gained to the right radial artery at the wrist and a 6
French sheath was placed. Slow intra arterial infusion of 5,000 SACHET
heparin, 5 mg Verapamil and 200 mcg nitroglycerin diluted in
patient's own blood was performed. No significant fluctuation in
patient's blood pressure seen. Then, a right radial artery angiogram
was obtained via sheath side port. Normal brachial artery branching
pattern seen. No significant anatomical variation. The right radial
artery caliber is adequate for vascular access.

Then, a 5 Frank Emmanuel Aborno 2 glide catheter was navigated over a
inch Terumo Glidewire into the right subclavian artery. A right
subclavian artery roadmap was obtained. The catheter was then
advanced into the V2 segment of the right vertebral artery. Townes
and lateral views of the head were obtained centered on the
posterior fossa. 3D rotational angiograms were acquired and post
processed in a separate workstation under concurrent attending
physician supervision. Selected images were sent to PACS.
FINDINGS: Again seen is a 5 mm dissecting aneurysm as seen on prior angiogram.
However, it appears that this aneurysm originates from a dissected
branch off of the telovelotonsillar segment of the right posterior
inferior cerebellar artery (PICA).

The right vertebral artery, basilar artery, and bilateral posterior
cerebral arteries are unremarkable. No abnormally high-flow, early
draining veins are seen. No regions of abnormal hypervascularity are
noted. The visualized dural sinuses are patent.

PROCEDURE:
Under biplane roadmap, the Sianturi 2 catheter was removed over the
wire. Intra arterial infusion of 5666 units of heparin, 200 mcg of
nitroglycerin and 5 mg of verapamil was performed to the right
radial artery via sheath side port. Then, a benchmark guide catheter
was advanced over the wire and under fluoroscopic guidance into the
distal V2 segment of the right vertebral artery. Magnified frontal
and lateral views of the head were obtained in the working
projections.

Using biplane roadmap, a headway duo microcatheter was coaxially
advanced over a synchro 2 micro guidewire into the right vertebral
artery and then navigated into the right posteroinferior cerebellar
artery. Frontal and lateral angiograms were obtained with
microcatheter contrast injection.

The microcatheter was advanced near the origin of the target vessel.
Attempted placement of a 2 mm x 4 cm Target 360 Nano coil proved
unsuccessful. Follow-up right vertebral artery angiograms with
magnified frontal and lateral views of the head were projections
obtained. The microcatheter was then advanced and wedged at the
origin of the right targeted PICA branch. The microcatheter was then
prepped with DMSO. Following, slow injection of onyx 18 was
performed under fluoroscopy. Embolization of the target vessel
achieved. Small reflux into the PICA noted. The catheter was removed
under constant aspiration. Follow-up angiogram showed occlusion of
PICA at the telovelotonsillar segment. Right vertebral artery
angiograms with frontal and lateral views of the entire head to
evidence of distal thromboembolic complication it.

Flat panel CT of the head was obtained and post processed in a
separate workstation with concurrent attending physician
supervision. Selected images were sent to PACS. No evidence of
hemorrhagic complication noted.

The catheter was subsequently withdrawn.

The right radial sheath was removed and inflatable band was placed
over the access site for patent hemostasis.
IMPRESSION: Embolization of left PICA branch dissecting aneurysm with sacrifice
of the PICA at the telovelotonsillar segment.

PLAN:
1. Continued observation in ICU.
2. Stat head CT for any neurological deterioration.

Patient and spouse communicated in person.

## 2022-04-25 ENCOUNTER — Telehealth: Payer: Self-pay | Admitting: Adult Health

## 2022-04-25 MED ORDER — LEVETIRACETAM 1000 MG PO TABS: 1000.0000 mg | ORAL_TABLET | Freq: Two times a day (BID) | ORAL | 0 refills | Status: DC

## 2022-04-25 NOTE — Telephone Encounter (Signed)
Pt request refill for evETIRAcetam (KEPPRA) 1000 MG tablet at  [facility] [address] [phone]   Pt scheduled appt 05/23/22

## 2022-04-25 NOTE — Telephone Encounter (Signed)
..   Pt understands that although there may be some limitations with this type of visit, we will take all precautions to reduce any security or privacy concerns.  Pt understands that this will be treated like an in office visit and we will file with pt's insurance, and there may be a patient responsible charge related to this service. ? ?

## 2022-04-25 NOTE — Telephone Encounter (Signed)
Refill sent to the pharmacy as requested

## 2022-05-19 ENCOUNTER — Telehealth: Payer: Self-pay | Admitting: Neurology

## 2022-05-19 NOTE — Telephone Encounter (Signed)
Pt called. Informed him of message nurse [facility] sent. I schedule pt for 2/5 @ 12:45 pm with Janett Billow.

## 2022-05-19 NOTE — Progress Notes (Signed)
[facility] [address] [place]. [place] [address] [phone]       STROKE FOLLOW UP NOTE  Andrew Perez Date of Birth:  07/08/1965 Medical Record Number:  [id number]   Reason for Referral: stroke and seizure follow up  Virtual Visit via Video Note  Virtual visit completed through [place], a video enabled telemedicine application. Due to national recommendations of social distancing due to COVID-19, a virtual visit is felt to be most appropriate for this patient at this time. Reviewed limitations, risks, security and privacy concerns of performing a virtual visit and the availability of in person appointments. I also reviewed that there may be a patient responsible charge related to this service. The patient agreed to proceed.    Patient location: home Provider location: in office, [facility] Persons participating in this virtual visit: patient and wife     SUBJECTIVE:    HPI:   Update 05/19/2022 JM: Patient returns for stroke and seizure follow-up.  Previously seen over 1 year ago with plan to transfer care to location closer to [place], [place] at that time. Currently in the process of trying to get his medical records sent to [facility] but has been having difficulty with this.   Overall stable from neurological standpoint, denies any new stroke/TIA symptoms or seizure activity.  Remains on aspirin.  Remains on Keppra 1000 mg twice daily. Does need a refill. No new concerns at this time.     History provided for reference purposes only Update 03/10/2021 JM: Returns for 65-monthstroke and seizure follow-up via MyChart video visit [place] - he has not yet established care with new neurology group  Overall stable -denies new stroke/TIA symptoms or additional seizure activity or symptoms Compliant on aspirin -denies side effects Compliant on Keppra 1000 mg twice daily -denies side  effects Continues to maintain ADLs and IADLs independently Blood pressure occasionally monitored at home which has been stable  Scheduled f/u with Dr. WRogers Blockerin 05/10/2021 Scheduled f/u with Dr. AHermelinda Dellen(endocrinology) on 03/29/2021 He plans on discussing ongoing refills for Keppra with Dr. AHermelinda Dellen(to help consolidate care)  No new concerns at this time  Update 08/27/2020 JM: Mr. KMorandireturns for 671-monthtroke follow-up accompanied by his wife.  He has been doing well since prior visit with excellent improvement of his gait and no longer using any type of assistive device.  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  He has returned back to driving and maintaining ADLs and IADLs independently without difficulty.  Remains on Keppra 1000 mg twice daily tolerating without any reoccurring seizure activity.  Remains on aspirin without associated side effects.  Blood pressure today 155/83 - does not routinely monitor at home as typically stable.  Has since had follow-up with Neurosurgeon Dr. WoEliberto Ivoryor history of aneurysm s/p embolization with recent imaging stable and plans on repeating imaging in 03/2021.  No further concerns at this time.  Update 02/26/2020 JM: Mr. KyCeroneeturns for stroke and seizure follow-up accompanied by his wife.  Stable from stroke standpoint with residual gait impairment and occasional dizziness/vertigo which has been slowly improving.  He continues to use RW for ambulation.  Denies new or worsening stroke/TIA symptoms.  After prior visit, wife called office the following day  for 2 seizure occurrences and advised to increase Keppra dose to 750 mg twice daily.  Recent episode approx 3 weeks ago of right hand numbness, slurred speech and then lost consciousness for approx 15-30 seconds. No postictal symptoms.  Transported to [facility] ED and found to have  pleural effusion and PNA. He was working with therapies but placed on hold d/t recent admission. He is currently awaiting to be released by PCP for return to therapies.  At some point, Keppra dosage further increased to 1000 mg twice daily but unsure when this occurred.  He has remained on Keppra 1000 mg twice daily without side effects. Remains on aspirin 325 mg daily without bleeding or bruising. He has since stopped atorvastatin due to GI irritation with resolution.  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  No further concerns at this time.  Initial visit 11/04/2019 JM: Andrew Perez is being seen for hospital follow-up accompanied by his wife.  Discharged home from [place] on 10/04/2019 with uneventful stay.  Residual deficits of cognitive impairment and imbalance as well as occasional difficulty swallowing chewy or hard foods.  He is frustrated regarding continued cognitive impairment and lack of improvement.  Initially participating in outpatient therapy at [facility] but placed on hold due to seizure type event during therapy on 10/25/2019 evaluated at [facility] ED.  Event consisted of generalized feeling of not feeling well and became diaphoretic and then progressed to staring and unresponsive and then lost consciousness for approximately 1 minute.  No postictal state per wife.  Unable to view hospital notes via epic.  Initiated Keppra 500 mg twice daily without recurrent seizure type activity.  Majority of antihypertensives discontinued except for clonidine.  Per wife, history of episodes of not feeling well and becoming diaphoretic but never further progress with typical trigger of increased heat.  Does complain of diarrhea since starting with needs of daily Imodium.  Unsure if diarrhea has improved as they have not trialed holding Imodium.  Plans on restarting outpatient therapy tomorrow  -wife is unsure if he is participating in speech therapy.  Ambulates with rolling walker and denies any recent falls.  Denies new or worsening stroke/TIA symptoms.  Continues on aspirin 325 mg daily and atorvastatin 40 mg daily without side effects.  Blood pressure today 140/79.  Recent follow-up with endocrinology and has not had follow-up with neurosurgery since discharge.  No further concerns at this time.  Stroke admission 09/17/2019 Mr. MARILYN WING is a 57 y.o. 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 SBP > 190 upon arrival.  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  Underwent right PICA aneurysm embolization with sacrifice of PICA segment.  Initiated aspirin 325 mg daily for stroke prevention.  Evidence of DVT left posterior tibial vein and placement of IVC filter as he is not anticoagulant candidate given IVH.  History of HTN found in hypertensive emergency with adjustments to antihypertensive regimen and long-term BP goal normotensive range.  LDL 104 initiate atorvastatin 40 mg  daily.  Other stroke risk factors include morbid obesity, prolactinoma and OSA.  Evaluated by therapies and recommended discharge to CIR for ongoing therapy needs.  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 small volume IVH in 4th ventricle. Interval resection previous central skull base infiltrative mass. Chronic R mastoid and middle ear effusion CT head  6/2 no increase in hemorrhage. No hydrocephalus. Known sellar mass w/ skull base erosion.  Cerebral angio right PICA 83m aneurysm  MRI - Evolving bilateral PICA territory infarct, similar in extension to prior CT. There is no hydrocephalus.  Repeat CT head 6/8 /21 evolving nonhemorrhagic cerebral infarcts right greater than left  2D Echo EF 55-60%. No source of embolus. LA moderate dilated LDL 104 HgbA1c 4.7 UDS + opiates SCDs for VTE prophylaxis No antithrombotic prior to admission, was on No antithrombotic given hemorrhage. Post aneurysm embolization, now off heparin IV, on ASA 325. Andrew not use DAPT given hemorrhage  Therapy recommendations:  CIR Disposition:  CIR      ROS:   14 system review of systems performed and negative with exception of no complaints  PMH:  Past Medical History:  Diagnosis Date   Hypertension    IVH (intraventricular hemorrhage) ([place]   due to HTN   [facility]    [facility]  Sleep apnea     PSH:  Past Surgical History:  Procedure Laterality Date   IR 3D INDEPENDENT WKST  09/19/2019   IR ANGIO INTRA EXTRACRAN SEL COM CAROTID INNOMINATE BILAT MOD SED  09/18/2019   IR ANGIO VERTEBRAL SEL SUBCLAVIAN INNOMINATE UNI L MOD SED  09/18/2019   IR ANGIO VERTEBRAL SEL VERTEBRAL UNI R MOD SED  09/18/2019   IR ANGIO VERTEBRAL SEL VERTEBRAL UNI R MOD SED  09/19/2019   IR ANGIOGRAM FOLLOW UP STUDY  09/19/2019   IR CT HEAD LTD  09/19/2019   IR IVC FILTER PLMT / S&I /IMG GUID/MOD SED  09/25/2019   IR IVC FILTER RETRIEVAL / S&I /Burke KeelsGUID/MOD SED  06/15/2020   IR NEURO EACH ADD'L AFTER BASIC UNI RIGHT (MS)  09/19/2019   IR RADIOLOGIST EVAL & MGMT  02/26/2020   IR RADIOLOGIST EVAL & MGMT  06/03/2020   IR TRANSCATH/EMBOLIZ  09/19/2019   IR [place] VASC ACCESS RIGHT  09/18/2019   IR [place] VASC ACCESS RIGHT  09/19/2019   IR [place] VASC ACCESS RIGHT  06/15/2020   RADIOLOGY WITH ANESTHESIA N/A 09/19/2019   Procedure: RADIOLOGY WITH ANESTHESIA ANUERYSM COILING;   Surgeon: dPedro Earls MD;  [place]  Service: Radiology;  Laterality: N/A;   TONSILLECTOMY      Social History:  Social History   Socioeconomic History   Marital status: Married    Spouse name: Not on file   Number of children: Not on file   Years of education: Not on file   Highest education level: Not on file  Occupational History   Not on file  Tobacco Use   Smoking status: Never   Smokeless tobacco: Never  Substance and Sexual Activity   Alcohol use: No   Drug use: No   Sexual activity: Not on file  Other Topics Concern   Not on file  Social History Narrative   Not on file   Social Determinants of Health   Financial Resource Strain: Not on file  Food Insecurity: Not on  file  Transportation Needs: Not on file  Physical Activity: Not on file  Stress: Not on file  Social Connections: Not on file  Intimate Partner Violence: Not on file    Family History:  Family History  Problem Relation Age of Onset   Colon cancer Father     Medications:   Current Outpatient Medications on File Prior to Visit  Medication Sig Dispense Refill   aspirin EC 81 MG tablet Take 81 mg by mouth daily. Swallow whole.     cabergoline (DOSTINEX) 0.5 MG tablet Take 0.5 mg by mouth 2 (two) times a week. Saturday and wednesday  0   cloNIDine (CATAPRES - DOSED IN MG/24 HR) 0.3 mg/24hr patch Place 1 patch (0.3 mg total) onto the skin every 7 (seven) days. Change every Saturday 4 patch 12   Grape Seed Extract 100 MG CAPS Take 100 mg by mouth in the morning and at bedtime.     hydrALAZINE (APRESOLINE) 100 MG tablet Take 100 mg by mouth 3 (three) times daily.     levETIRAcetam (KEPPRA) 1000 MG tablet Take 1 tablet (1,000 mg total) by mouth 2 (two) times daily. 60 tablet 0   levothyroxine (SYNTHROID) 100 MCG tablet Take 100 mcg by mouth daily before breakfast.     Misc Natural Products (PROSTATE HEALTH PO) Take 1 capsule by mouth daily. [place]     valsartan (DIOVAN)  160 MG tablet Take 160 mg by mouth daily.     No current facility-administered medications on file prior to visit.    Allergies:   Allergies  Allergen Reactions   Codeine Shortness Of Breath   Shellfish Allergy Shortness Of Breath      OBJECTIVE:  Physical Exam General: well developed, well nourished, pleasant middle-age Caucasian male, seated, in no evident distress Head: head normocephalic and atraumatic.    Neurologic Exam Mental Status: Awake and fully alert. Oriented to place and time. Recent and remote memory intact. Attention span, concentration and fund of knowledge appropriate. Mood and affect appropriate.  Cranial Nerves: Hearing intact to voice. Face, tongue, palate moves normally and symmetrically.  Shoulder shrug symmetric. Motor: No evidence of weakness per drift assessment Coordination: Rapid alternating movements normal in all extremities. Finger-to-nose and heel-to-shin performed accurately bilaterally.        ASSESSMENT/PLAN: Andrew Perez is a 57 y.o. year old male presented with dizziness with episode of vomiting followed by headache and hypertensive urgency on 09/17/2019 with stroke work-up revealing bilateral PICA territory infarcts and small fourth ventricle IVH in setting of dissecting right PICA aneurysm and hypertensive emergency. S/p R PICA aneurysm embolization with sacrifice of the right PICA at telovelotonsillar segment 6/3.  Seizure-like episode 10/25/2019 evaluated at [facility].  Vascular risk factors include HTN, HLD, IVH 2018, prolactinoma dx 06/2016, morbid obesity, acute DVT s/p IVC filter and OSA on CPAP.     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   Initial seizure activity 10/2019 and reoccurring 02/02/2020 likely in setting of pleural effusion and pneumonia with no additional events since that time Continue Keppra 1000 mg twice daily for seizure prophylaxis -refill provided Discussed importance of avoiding  seizure triggers and to call office with any reoccurring seizure activity or symptoms.   Dissecting R PICA aneurysm s/p embolization: Followed by neurosurgery Dr. Rogers Blocker Prolactinoma: Continue to follow with [facility] endocrinology and neurosurgery for routine monitoring and management   Andrew reach out to our medical records department to assist with sending medical records to [facility] group in [place], [place] for transfer of care. He was advised to reach out to office with any questions or concerns during the interval time   CC:  Street, [place], MD    I spent 15 minutes of face-to-face and non-face-to-face time with patient via MyChart video visit.  This included previsit chart review, lab review, study review, order entry, electronic health record documentation, patient education and discussion regarding above diagnoses and treatment plan and answered all the questions to patient's satisfaction   Frann Rider, [facility]  [facility] [address] [place], [place] [address]  Phone [phone] Fax [phone] Note: This document was prepared with digital dictation and possible smart phrase technology. Any transcriptional errors that result from this process are unintentional.

## 2022-05-23 ENCOUNTER — Telehealth: Payer: 59 | Admitting: Adult Health

## 2022-05-23 ENCOUNTER — Telehealth (INDEPENDENT_AMBULATORY_CARE_PROVIDER_SITE_OTHER): Payer: Self-pay | Admitting: Adult Health

## 2022-05-23 ENCOUNTER — Encounter: Payer: Self-pay | Admitting: Adult Health

## 2022-05-23 DIAGNOSIS — I615 Nontraumatic intracerebral hemorrhage, intraventricular: Secondary | ICD-10-CM

## 2022-05-23 DIAGNOSIS — I6350 Cerebral infarction due to unspecified occlusion or stenosis of unspecified cerebral artery: Secondary | ICD-10-CM

## 2022-05-23 DIAGNOSIS — R569 Unspecified convulsions: Secondary | ICD-10-CM

## 2022-05-23 DIAGNOSIS — I69398 Other sequelae of cerebral infarction: Secondary | ICD-10-CM

## 2022-05-23 MED ORDER — LEVETIRACETAM 1000 MG PO TABS: 1000.0000 mg | ORAL_TABLET | Freq: Two times a day (BID) | ORAL | 3 refills | Status: AC

## 2022-05-23 NOTE — Patient Instructions (Addendum)
Your Plan:  Continue keppra '1000mg'$  twice daily for seizure prevention - refill provided  Establish care with [facility] for ongoing management        Thank you for coming to see [place] at [facility]. I hope we have been able to provide you high quality care today.  You may receive a patient satisfaction survey over the next few weeks. We would appreciate your feedback and comments so that we may continue to improve ourselves and the health of our patients.

## 2023-10-13 ENCOUNTER — Encounter (HOSPITAL_COMMUNITY): Payer: Self-pay | Admitting: Interventional Radiology
